# Patient Record
Sex: Female | Born: 1975 | Hispanic: Yes | Marital: Married | Smoking: Never smoker
Health system: Southern US, Community
[De-identification: ages and names within clinical notes are randomized; demographics above are authoritative.]

## PROBLEM LIST (undated history)

## (undated) ENCOUNTER — Inpatient Hospital Stay (HOSPITAL_COMMUNITY): Payer: Self-pay

## (undated) DIAGNOSIS — E669 Obesity, unspecified: Secondary | ICD-10-CM

## (undated) DIAGNOSIS — E119 Type 2 diabetes mellitus without complications: Secondary | ICD-10-CM

## (undated) DIAGNOSIS — Z789 Other specified health status: Secondary | ICD-10-CM

## (undated) HISTORY — PX: NO PAST SURGERIES: SHX2092

---

## 2002-03-29 ENCOUNTER — Inpatient Hospital Stay (HOSPITAL_COMMUNITY): Admission: AD | Admit: 2002-03-29 | Discharge: 2002-03-29 | Payer: Self-pay | Admitting: Obstetrics and Gynecology

## 2003-02-07 ENCOUNTER — Inpatient Hospital Stay (HOSPITAL_COMMUNITY): Admission: AD | Admit: 2003-02-07 | Discharge: 2003-02-07 | Payer: Self-pay | Admitting: Obstetrics and Gynecology

## 2004-01-18 ENCOUNTER — Inpatient Hospital Stay (HOSPITAL_COMMUNITY): Admission: AD | Admit: 2004-01-18 | Discharge: 2004-01-19 | Payer: Self-pay | Admitting: Family Medicine

## 2004-02-12 ENCOUNTER — Encounter: Admission: RE | Admit: 2004-02-12 | Discharge: 2004-02-12 | Payer: Self-pay | Admitting: Obstetrics and Gynecology

## 2004-11-30 ENCOUNTER — Inpatient Hospital Stay (HOSPITAL_COMMUNITY): Admission: AD | Admit: 2004-11-30 | Discharge: 2004-11-30 | Payer: Self-pay | Admitting: *Deleted

## 2006-02-09 ENCOUNTER — Inpatient Hospital Stay (HOSPITAL_COMMUNITY): Admission: AD | Admit: 2006-02-09 | Discharge: 2006-02-09 | Payer: Self-pay | Admitting: Obstetrics and Gynecology

## 2006-04-12 ENCOUNTER — Inpatient Hospital Stay (HOSPITAL_COMMUNITY): Admission: AD | Admit: 2006-04-12 | Discharge: 2006-04-12 | Payer: Self-pay | Admitting: Obstetrics & Gynecology

## 2006-04-14 ENCOUNTER — Ambulatory Visit: Payer: Self-pay | Admitting: Obstetrics & Gynecology

## 2006-04-14 ENCOUNTER — Other Ambulatory Visit: Admission: RE | Admit: 2006-04-14 | Discharge: 2006-04-14 | Payer: Self-pay | Admitting: Obstetrics & Gynecology

## 2006-05-05 ENCOUNTER — Ambulatory Visit: Payer: Self-pay | Admitting: Obstetrics & Gynecology

## 2006-08-26 ENCOUNTER — Ambulatory Visit: Payer: Self-pay | Admitting: Obstetrics & Gynecology

## 2006-08-27 ENCOUNTER — Other Ambulatory Visit: Admission: RE | Admit: 2006-08-27 | Discharge: 2006-08-27 | Payer: Self-pay | Admitting: Obstetrics & Gynecology

## 2006-09-09 ENCOUNTER — Ambulatory Visit: Payer: Self-pay | Admitting: Obstetrics & Gynecology

## 2006-09-16 ENCOUNTER — Ambulatory Visit: Payer: Self-pay | Admitting: Obstetrics and Gynecology

## 2007-12-08 ENCOUNTER — Ambulatory Visit: Payer: Self-pay | Admitting: Obstetrics & Gynecology

## 2007-12-08 ENCOUNTER — Ambulatory Visit (HOSPITAL_COMMUNITY): Admission: RE | Admit: 2007-12-08 | Discharge: 2007-12-08 | Payer: Self-pay | Admitting: Family Medicine

## 2008-02-23 ENCOUNTER — Inpatient Hospital Stay (HOSPITAL_COMMUNITY): Admission: AD | Admit: 2008-02-23 | Discharge: 2008-02-23 | Payer: Self-pay | Admitting: Family Medicine

## 2010-08-17 ENCOUNTER — Encounter: Payer: Self-pay | Admitting: Family Medicine

## 2010-12-12 NOTE — H&P (Signed)
Shelley Bradley, Shelley Bradley        ACCOUNT NO.:  [id number]   MEDICAL RECORD NO.:  [id number]          PATIENT TYPE:  WOC   LOCATION:  WOC                          FACILITY:  [facility]   PHYSICIAN:  Dorma Russell              DATE OF BIRTH:  06/04/1978   DATE OF ADMISSION:  04/14/2006  DATE OF DISCHARGE:                                HISTORY & PHYSICAL   REASON FOR VISIT:  Followup of right cystic ovary with right pelvic pain and  secondary amenorrhea.   HISTORY:  This is a G2, P2-0-0-2 who had two vaginal deliveries, last in  2000.  She first presented to [facility] in July of this year, reporting  approximately 2-year history of abnormal menses with prolonged cycles, often  skipping months.  Prior to that, she had regular monthly cycles.  She states  her flow is moderate, lasting 3 to 4 days, and she has some mild  dysmenorrhea.  No intermenstrual bleeding.  She is not on any contraception  and is trying to get pregnant.   GYN HISTORY:  Pap smear was done in May 2007.  Negative per patient.  Confirmation pending.  She states she has never had an abnormal pap.  She  has never had a mammogram.   SURGICAL HISTORY:  No transfusions.  No operations.   FAMILY HISTORY:  Positive for diabetes and heart disease.   PERSONAL MEDICAL HISTORY:  Negative for significant medical illness.  She  has no allergies.  No medications.   SOCIAL HISTORY:  She does not work outside the home.  Is a nonsmoker.  Denies alcohol and drugs.  Denies any high-risk behaviors for HIV.  Denies  being abused.  Is not strongly motivated to lose weight.   REVIEW OF SYSTEMS:  Negative other than she does have intermittent right  pelvic pain which has been ongoing since July and at times she relates this  to occurrence after intercourse, however, it starts about a day after  intercourse and lasts about a day, and spontaneously resolves.  She does not  have pain at this time.   EXAM:  VITAL SIGNS:  BP 100/60, weight 327,  height 5 feet 2.  Patient is  morbidly obese.  BREASTS:  Deferred this visit.  ABDOMEN:  Soft, morbidly obese, essentially nontender.  PELVIC:  Exam done by Dr. Okey Dupre.  Uterus sounds to 9 cm.  Endometrial biopsy  is done by Dr. Okey Dupre with good tissue sampling.   ULTRASOUND:  The ultrasound findings of the transvaginal ultrasound done on  February 09, 2006 show numerous cystic lesions on the right ovary as well as  mildly thickened endometrium.   ASSESSMENT:  1. Morbid obesity.  2. Polycystic ovarian syndrome.  3. Metabolic syndrome.  4. Secondary amenorrhea.  5. Rule out endometrial hyperplasia.   PLAN:  UPT is deferred today.  TSH also deferred at this time.  Patient is  to return in 2 weeks for the result of her endometrial biopsy.  We will give  consideration for Glucophage and OCPs if this is negative for hyperplasia.  She may need stimulation  in order to achieve pregnancy.      Shelley Bradley, C.N.M.    ______________________________  Dorma Russell    DP/MEDQ  D:  04/14/2006  T:  04/15/2006  Job:  [id number]

## 2010-12-12 NOTE — Group Therapy Note (Signed)
NAME:  Shelley Bradley, Shelley Bradley        ACCOUNT NO.:  [id number]   MEDICAL RECORD NO.:  [id number]          PATIENT TYPE:  MAT   LOCATION:  [facility]                    [facility]   PHYSICIAN:  Elsie Lincoln, MD      DATE OF BIRTH:  06/04/1978   DATE OF SERVICE:                                  CLINIC NOTE   HISTORY:  The patient is a 35 year old female who presents thinking that  she is pregnant and did an  ultrasound department today.  She had a  positive pregnancy test at the health department in April.  She comes  today stating that she feels the baby move.  She is looking at the  ultrasound, but has no baby on the ultrasound and telling her partner  that there is the baby's head, there is the baby's belly, there is the  baby's heart beat.  However, the whole time there is not a baby in the  uterus.  The ultrasound tech called me and I reviewed the findings with  the patient and brought her down to the clinic where I did an urine  pregnancy test and it was negative.  I did call the health department.  I think it is probably a lab error.  The patient did not seem surprised.  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  We had consulted the  gynecologist who suggested Depo-Provera 300 mg every other month or  Mirena IUD.  The patient elected for the Depo-Provera and received 1  shot.  She missed her next appointment and then never followed up.  She  says she remembers all this and knows that she did not followup.  She  has not had a period since December of last year, so to me she is at  high risk for cancer.   Also note, she had approximately a 4 cm hemorrhagic cyst on one of her  ovaries and this needs to be followed up.  We are not able to do an  endometrial biopsy today given that she did not really have a clinic  appointment, but I was seeing her in the ultrasound suite.  So I  scheduled her for an  endometrial biopsy in the next week or 2.  The  patient understands that this could be cancer and could kill her if she  does not come.  I did all of this with an interpreter present even  though the patient speaks moderately good [place].  She stated that she  understood and promised to show up.  We will see the patient for the  endometrial biopsy and give her Depo-Provera or IUD until we get the  biopsy back.           ______________________________  Elsie Lincoln, MD     KL/MEDQ  D:  12/08/2007  T:  12/08/2007  Job:  [id number]

## 2010-12-12 NOTE — Group Therapy Note (Signed)
NAMELARA, PALINKAS NO.:  [id number]   MEDICAL RECORD NO.:  [id number]                   PATIENT TYPE:  OUT   LOCATION:  [facility]                           FACILITY:  [facility]   PHYSICIAN:  Elsie Lincoln, MD                   DATE OF BIRTH:  1975-08-28   DATE OF SERVICE:  02/12/2004                                    CLINIC NOTE   REASON FOR VISIT:  The patient is a 35 year old G2 para 2-0-0-2 most likely  who is a follow-up from [facility] on January 18, 2004 visit.  The patient thought she  was with her third pregnancy.  She said that she got care somewhere in  [place] and was told by a ultrasound that she had twins.  She came into  the [facility]U on January 18, 2004 complaining of abdominal pain.  However, when she  came to the [facility]U her UPT was negative and she was not pregnant.  Today, the  patient cannot remember the name of clinic where she went and we asked her  who took her there and she said her friend but that friend now lives in  [place] and she cannot ask that person where they were.  The patient said  that she now wants workup for infertility.  She has been with her partner  for 3 years.  This is a new partner, who is not the father of her two  children.  She states that she has not had a menses since January 2005.   PAST MEDICAL HISTORY:  Denies; however, is morbidly obese.   GYNECOLOGICAL HISTORY:  No history of ovarian cysts, fibroid tumors STDs.  However, has been amenorrheic since January.   OBSTETRICAL HISTORY:  NSVD x2 done at [facility].   SURGERY:  Denies.   ALLERGIES:  Denies.   ASSESSMENT AND PLAN:  A 35 year old female with questionable oligomenorrhea  and questionable infertility.   1. Given the patient's bizarre story will try to retrieve records.  The     patient is to go to [place] and write down the name and get the phone     number of the place where she got all her prenatal care and recent GYN     care.  She will return this  number to [place] and we will retrieve records.     She will return in 3 weeks and we will go over everything.  The patient     understands that infertility treatment is not covered by the hospital and     she would have to pay up-front before any tests are done.  However, we     will do a workup for oligomenorrhea.  2. Questionable female origin of infertility.  Sperm analysis will be     necessary before any workup is done on the patient.  3. Return to clinic in 3 weeks once records are here.  Elsie Lincoln, MD    KL/MEDQ  D:  02/12/2004  T:  02/12/2004  Job:  [id number]

## 2010-12-12 NOTE — Group Therapy Note (Signed)
Shelley Bradley, ASANO NO.:  [id number]   MEDICAL RECORD NO.:  [id number]          PATIENT TYPE:  WOC   LOCATION[facility]  [facility]                   [facility]ACILITY[facility]   PHYSICIAN:  Argentina Donovan, MD        DATE OF BIRTH:  06/04/1978   DATE OF SERVICE:  09/16/2006                                  CLINIC NOTE   The patient is a 35 year old, Spanish-speaking Hispanic female, gravida  2, para 2-0-0-2, with secondary amenorrhea who is morbidly obese at 322  pounds and at 5 feet tall.  She had an endometrial biopsy in September  of 2007 which showed focal simple hyperplasia without atypia, was placed  on cyclic progesterone, came back and had another biopsy on January 31st  which showed disordered proliferative pattern with focal changes  consistent with complex atypical hyperplasia.  We have consulted with  the GYN oncologists, i.e., Dr. __________ , who suggested that she be  treated with 300 of Depo-Provera every other month, or 150 every month,  or a Merina IUD.  We discussed with Ada, our lovely translator, the  condition, and the patient has opted for the injection.  We are going to  start her on 300 every other month.  At the end of 6 months, we will go  ahead and repeat the endometrial biopsy.   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           ______________________________  Argentina Donovan, MD     PR/MEDQ  D:  09/16/2006  T:  09/16/2006  Job:  [id number]

## 2010-12-12 NOTE — Group Therapy Note (Signed)
Shelley Bradley, Shelley Bradley        ACCOUNT Shelley Bradley.:  [id number]   MEDICAL RECORD Shelley Bradley.:  [id number]          PATIENT TYPE:  WOC   LOCATION:  [facility]                   [facility]ILITY[facility]   PHYSICIAN:  Dorthula Perfect, MD     DATE OF BIRTH:  06/04/1978   DATE OF SERVICE:  08/26/2006                                  CLINIC NOTE   This 35 year old Hispanic female, gravida 2, para 2, was seen here in  September 2007 with secondary amenorrhea.  She weighed 327 pounds.  An  endometrial biopsy was performed which showed focal simple hyperplasia  without atypia, and proliferative endometrium.  She was started on  Provera to take for 10 days beginning the first day of each month.  Her  last menstrual period started January the 13th.  She is currently on her  third and last cycle of the Provera.   PHYSICAL EXAMINATION:  Height 5 feet 1 inch, weight 324, blood pressure  113/78.  ABDOMEN:  She is grossly obese.  PELVIC EXAM:  Was normal.  The anterior lip of the cervix was grasped  with single tooth __________ .  The endocervical canal easily sounded.  The __________ instrument was then inserted for a depth of about 9 cm,  and had a good endometrial biopsy specimen was obtained.   DIAGNOSIS:  History of uterine and simple hyperplasia.   DISPOSITION:  1. __________ endometrial biopsy.  2. Patient will return in 2 weeks for results.  If the findings are      normal at that time, consideration will be to give her medication      to help her to ovulate to become pregnant which she is desirous of.           ______________________________  Dorthula Perfect, MD     ER/MEDQ  D:  08/26/2006  T:  08/26/2006  Job:  [id number]

## 2011-04-24 LAB — URINALYSIS, ROUTINE W REFLEX MICROSCOPIC
Bilirubin Urine: NEGATIVE
Nitrite: NEGATIVE
Specific Gravity, Urine: 1.025
Urobilinogen, UA: 0.2

## 2011-04-24 LAB — URINE MICROSCOPIC-ADD ON

## 2011-06-16 ENCOUNTER — Emergency Department (HOSPITAL_COMMUNITY)
Admission: EM | Admit: 2011-06-16 | Discharge: 2011-06-17 | Disposition: A | Discharge status: Home / Self Care | Payer: Self-pay | Attending: Emergency Medicine | Admitting: Emergency Medicine

## 2011-06-16 ENCOUNTER — Emergency Department (HOSPITAL_COMMUNITY): Payer: Self-pay

## 2011-06-16 DIAGNOSIS — N390 Urinary tract infection, site not specified: Secondary | ICD-10-CM | POA: Insufficient documentation

## 2011-06-16 DIAGNOSIS — O2390 Unspecified genitourinary tract infection in pregnancy, unspecified trimester: Secondary | ICD-10-CM | POA: Insufficient documentation

## 2011-06-16 DIAGNOSIS — R300 Dysuria: Secondary | ICD-10-CM | POA: Insufficient documentation

## 2011-06-16 DIAGNOSIS — Z3490 Encounter for supervision of normal pregnancy, unspecified, unspecified trimester: Secondary | ICD-10-CM

## 2011-06-16 DIAGNOSIS — R109 Unspecified abdominal pain: Secondary | ICD-10-CM | POA: Insufficient documentation

## 2011-06-16 LAB — HCG, QUANTITATIVE, PREGNANCY: hCG, Beta Chain, Quant, S: 12351 m[IU]/mL — ABNORMAL HIGH (ref <5)

## 2011-06-16 LAB — CBC
MCH: 27.7 pg (ref 26.0–34.0)
MCHC: 33 g/dL (ref 30.0–36.0)
RBC: 4.19 MIL/uL (ref 3.87–5.11)
RDW: 14.1 % (ref 11.5–15.5)

## 2011-06-16 LAB — BASIC METABOLIC PANEL
Calcium: 9.5 mg/dL (ref 8.4–10.5)
Chloride: 98 mEq/L (ref 96–112)
GFR calc Af Amer: >90 mL/min (ref >90)
GFR calc non Af Amer: >90 mL/min (ref >90)
Glucose, Bld: 86 mg/dL (ref 70–99)
Potassium: 4.2 mEq/L (ref 3.5–5.1)

## 2011-06-16 LAB — DIFFERENTIAL
Basophils Relative: 0 % (ref 0–1)
Eosinophils Absolute: 0.1 10*3/uL (ref 0.0–0.7)
Eosinophils Relative: 1 % (ref 0–5)
Lymphocytes Relative: 32 % (ref 12–46)
Lymphs Abs: 2.6 10*3/uL (ref 0.7–4.0)
Neutro Abs: 4.9 10*3/uL (ref 1.7–7.7)
Neutrophils Relative %: 62 % (ref 43–77)

## 2011-06-16 LAB — URINALYSIS, ROUTINE W REFLEX MICROSCOPIC
Bilirubin Urine: NEGATIVE
Protein, ur: 100 mg/dL — AB
Specific Gravity, Urine: 1.023 (ref 1.005–1.030)
Urobilinogen, UA: 1 mg/dL (ref 0.0–1.0)

## 2011-06-16 LAB — URINE MICROSCOPIC-ADD ON

## 2011-06-16 LAB — POCT PREGNANCY, URINE: Preg Test, Ur: POSITIVE

## 2011-06-16 MED ORDER — PRENATAL RX 60-1 MG PO TABS: 1.0000 | ORAL_TABLET | Freq: Every day | ORAL | Status: DC

## 2011-06-16 MED ORDER — CEFTRIAXONE SODIUM 1 G IJ SOLR
1.0000 g | Freq: Once | INTRAMUSCULAR | Status: AC
  Administered 2011-06-16: 1 g via INTRAVENOUS
  Filled 2011-06-16: qty 10

## 2011-06-16 MED ORDER — SODIUM CHLORIDE 0.9 % IV BOLUS (SEPSIS)
1000.0000 mL | Freq: Once | INTRAVENOUS | Status: AC
  Administered 2011-06-16: 1000 mL via INTRAVENOUS

## 2011-06-16 MED ORDER — CEPHALEXIN 500 MG PO CAPS: 500.0000 mg | ORAL_CAPSULE | Freq: Two times a day (BID) | ORAL | Status: AC

## 2011-06-16 NOTE — ED Notes (Signed)
Patient returned from Ultrasound. 

## 2011-06-16 NOTE — ED Notes (Signed)
Patient transported to Ultrasound 

## 2011-06-16 NOTE — ED Notes (Signed)
Complaining of abd pain and urine in her blood. Does not speak english

## 2011-06-16 NOTE — ED Provider Notes (Signed)
History     CSN: [id number] Arrival date & time: 06/16/2011  7:26 PM   None     Chief Complaint  Patient presents with  . Flank Pain    (Consider location/radiation/quality/duration/timing/severity/associated sxs/prior treatment) Patient is a 35 y.o. female presenting with flank pain. The history is provided by the patient and a relative.  Flank Pain This is a new problem. The current episode started 2 days ago. The problem occurs constantly. The problem has not changed since onset.The symptoms are aggravated by nothing. The symptoms are relieved by nothing. She has tried nothing for the symptoms.  pt with lmp 2 years ago, g4p2, now with flank pain radiating to rlq with dysuria, no fever, emesis x1  History reviewed. No pertinent past medical history.  History reviewed. No pertinent past surgical history.  History reviewed. No pertinent family history.  History  Substance Use Topics  . Smoking status: Not on file  . Smokeless tobacco: Not on file  . Alcohol Use: No    OB History    Grav Para Term Preterm Abortions TAB SAB Ect Mult Living                  Review of Systems  Genitourinary: Positive for flank pain.  All other systems reviewed and are negative.    Allergies  Review of patient's allergies indicates no known allergies.  Home Medications  No current outpatient prescriptions on file.  BP 131/67  Pulse 98  Temp(Src) 98.1 F (36.7 C) (Oral)  Resp 20  SpO2 100%  Physical Exam  Nursing note and vitals reviewed. Constitutional: She is oriented to person, place, and time. Vital signs are normal. She appears well-developed and well-nourished.  Non-toxic appearance. No distress.  HENT:  Head: Normocephalic and atraumatic.  Eyes: Conjunctivae and EOM are normal. Pupils are equal, round, and reactive to light.  Neck: Normal range of motion. Neck supple. No tracheal deviation present.  Cardiovascular: Normal rate, regular rhythm and normal heart sounds.   Exam reveals no gallop.   No murmur heard. Pulmonary/Chest: Effort normal and breath sounds normal. No stridor. No respiratory distress. She has no wheezes.  Abdominal: Soft. Normal appearance and bowel sounds are normal. She exhibits no distension. There is no tenderness. There is CVA tenderness. There is no rebound and no guarding.  Musculoskeletal: Normal range of motion. She exhibits no edema and no tenderness.  Neurological: She is alert and oriented to person, place, and time. She has normal strength. No cranial nerve deficit or sensory deficit. GCS eye subscore is 4. GCS verbal subscore is 5. GCS motor subscore is 6.  Skin: Skin is warm and dry.  Psychiatric: She has a normal mood and affect. Her speech is normal and behavior is normal.    ED Course  Procedures (including critical care time)  Labs Reviewed  URINALYSIS, ROUTINE W REFLEX MICROSCOPIC - Abnormal; Notable for the following:    Appearance CLOUDY (*)    Hgb urine dipstick LARGE (*)    Protein, ur 100 (*)    Leukocytes, UA LARGE (*)    All other components within normal limits  URINE MICROSCOPIC-ADD ON - Abnormal; Notable for the following:    Squamous Epithelial / LPF MANY (*)    Bacteria, UA MANY (*)    All other components within normal limits  POCT PREGNANCY, URINE  POCT PREGNANCY, URINE  CBC  DIFFERENTIAL  BASIC METABOLIC PANEL  HCG, QUANTITATIVE, PREGNANCY   No results found.   No diagnosis  found.    MDM  Pt will be tx for pyelonephritis, awaiting pelvic US, moved to cdu for holding, d/w np Felicie Morn, will f/u studies        Toy Baker, MD 06/20/11 1434

## 2011-06-16 NOTE — ED Notes (Signed)
Pt presents with 2 day h/o R flank pain that has been constant and radiates into R groin.  Pt reports dysuria and onset of hematuria since yesterday.  +vomiting.

## 2011-07-10 ENCOUNTER — Other Ambulatory Visit: Payer: Self-pay

## 2011-07-28 NOTE — L&D Delivery Note (Signed)
Delivery Note At 8:19 AM a viable female was delivered via Vaginal, Spontaneous Delivery (Presentation: ;  ).  APGAR: 8, 9; weight 6 lb 2.1 oz (2781 g).   Placenta status: , .  Cord:  with the following complications: .  Cord pH: not done  Anesthesia: Epidural  Episiotomy:  Lacerations:  Suture Repair: 2.0 Est. Blood Loss (mL):   Mom to postpartum.  Baby to nursery-stable.  Sussan Meter A 10/28/2011, 8:30 AM

## 2011-08-06 LAB — ABO/RH: RH Type: POSITIVE

## 2011-08-06 LAB — GC/CHLAMYDIA PROBE AMP, GENITAL
Chlamydia: NEGATIVE
Gonorrhea: NEGATIVE

## 2011-08-06 LAB — RUBELLA ANTIBODY, IGM: Rubella: IMMUNE

## 2011-08-07 ENCOUNTER — Other Ambulatory Visit (HOSPITAL_COMMUNITY): Payer: Self-pay | Admitting: Obstetrics

## 2011-08-07 DIAGNOSIS — Z3689 Encounter for other specified antenatal screening: Secondary | ICD-10-CM

## 2011-08-13 ENCOUNTER — Ambulatory Visit (HOSPITAL_COMMUNITY): Payer: Self-pay | Attending: Obstetrics

## 2011-08-24 ENCOUNTER — Encounter (HOSPITAL_COMMUNITY): Payer: Self-pay

## 2011-08-24 ENCOUNTER — Ambulatory Visit (HOSPITAL_COMMUNITY)
Admission: RE | Admit: 2011-08-24 | Discharge: 2011-08-24 | Disposition: A | Discharge status: Home / Self Care | Payer: Self-pay | Source: Ambulatory Visit | Attending: Obstetrics | Admitting: Obstetrics

## 2011-08-24 DIAGNOSIS — O358XX0 Maternal care for other (suspected) fetal abnormality and damage, not applicable or unspecified: Secondary | ICD-10-CM | POA: Insufficient documentation

## 2011-08-24 DIAGNOSIS — Z363 Encounter for antenatal screening for malformations: Secondary | ICD-10-CM | POA: Insufficient documentation

## 2011-08-24 DIAGNOSIS — E669 Obesity, unspecified: Secondary | ICD-10-CM | POA: Insufficient documentation

## 2011-08-24 DIAGNOSIS — Z1389 Encounter for screening for other disorder: Secondary | ICD-10-CM | POA: Insufficient documentation

## 2011-08-24 DIAGNOSIS — Z3689 Encounter for other specified antenatal screening: Secondary | ICD-10-CM

## 2011-09-05 ENCOUNTER — Inpatient Hospital Stay (HOSPITAL_COMMUNITY)
Admission: AD | Admit: 2011-09-05 | Discharge: 2011-09-05 | Disposition: A | Discharge status: Home / Self Care | Payer: Self-pay | Source: Ambulatory Visit | Attending: Obstetrics | Admitting: Obstetrics

## 2011-09-05 ENCOUNTER — Encounter (HOSPITAL_COMMUNITY): Payer: Self-pay | Admitting: *Deleted

## 2011-09-05 DIAGNOSIS — A499 Bacterial infection, unspecified: Secondary | ICD-10-CM | POA: Insufficient documentation

## 2011-09-05 DIAGNOSIS — N760 Acute vaginitis: Secondary | ICD-10-CM | POA: Insufficient documentation

## 2011-09-05 DIAGNOSIS — O2390 Unspecified genitourinary tract infection in pregnancy, unspecified trimester: Secondary | ICD-10-CM | POA: Insufficient documentation

## 2011-09-05 DIAGNOSIS — B9689 Other specified bacterial agents as the cause of diseases classified elsewhere: Secondary | ICD-10-CM | POA: Insufficient documentation

## 2011-09-05 DIAGNOSIS — R109 Unspecified abdominal pain: Secondary | ICD-10-CM | POA: Insufficient documentation

## 2011-09-05 HISTORY — DX: Other specified health status: Z78.9

## 2011-09-05 LAB — WET PREP, GENITAL

## 2011-09-05 LAB — URINE MICROSCOPIC-ADD ON

## 2011-09-05 LAB — URINALYSIS, ROUTINE W REFLEX MICROSCOPIC
Bilirubin Urine: NEGATIVE
Ketones, ur: NEGATIVE mg/dL
Nitrite: NEGATIVE
Urobilinogen, UA: 0.2 mg/dL (ref 0.0–1.0)

## 2011-09-05 LAB — AMNISURE RUPTURE OF MEMBRANE (ROM) NOT AT ARMC: Amnisure ROM: NEGATIVE

## 2011-09-05 MED ORDER — PRENATAL RX 60-1 MG PO TABS: 1.0000 | ORAL_TABLET | Freq: Every day | ORAL | Status: DC

## 2011-09-05 MED ORDER — METRONIDAZOLE 500 MG PO TABS: 500.0000 mg | ORAL_TABLET | Freq: Two times a day (BID) | ORAL | Status: AC

## 2011-09-05 NOTE — Progress Notes (Signed)
Written and verbal d/c instructions given and understanding voiced. 

## 2011-09-05 NOTE — ED Notes (Signed)
Georges Mouse CNM and interpreter in to discuss d/c plan

## 2011-09-05 NOTE — ED Notes (Signed)
Unable to void

## 2011-09-05 NOTE — Progress Notes (Signed)
In-house spanish interpreter helping with communication. 

## 2011-09-05 NOTE — Progress Notes (Signed)
About 2030 started leaking fld and thinks was clear fld. Afterward starting hurting like having ctxs

## 2011-09-05 NOTE — Progress Notes (Signed)
I&O cath done and pt tol well. Urine clear. Pt leaked some urine during cath. After cath pt got up to BR to void

## 2011-09-05 NOTE — Progress Notes (Signed)
Georges Mouse CNM in [place] exam done and fern slide and amnisure obtained. Pt tol well.

## 2011-09-05 NOTE — ED Notes (Signed)
Wet prep obtained and to lab 

## 2011-09-05 NOTE — ED Provider Notes (Signed)
History     No chief complaint on file.  HPI 36 y.o. [id number] at [redacted]w[redacted]d c/o SROM at 2030 tonight, abd pain started shortly thereafter. Reports 5 contractions in 1 hour. No bleeding.     Past Medical History  Diagnosis Date  . No pertinent past medical history     Past Surgical History  Procedure Date  . No past surgeries     Family History  Problem Relation Age of Onset  . Anesthesia problems Neg Hx   . Hypotension Neg Hx   . Malignant hyperthermia Neg Hx   . Pseudochol deficiency Neg Hx     History  Substance Use Topics  . Smoking status: Never Smoker   . Smokeless tobacco: Not on file  . Alcohol Use: No    Allergies: No Known Allergies  Prescriptions prior to admission  Medication Sig Dispense Refill  . ibuprofen (ADVIL,MOTRIN) 200 MG tablet Take 200 mg by mouth every 6 (six) hours as needed. pain      . Prenatal Vit-Fe Fumarate-FA (PRENATAL MULTIVITAMIN) 60-1 MG tablet Take 1 tablet by mouth daily.  30 tablet  0    Review of Systems  Constitutional: Negative.   Respiratory: Negative.   Cardiovascular: Negative.   Gastrointestinal: Positive for abdominal pain. Negative for nausea, vomiting, diarrhea and constipation.  Genitourinary: Negative for dysuria, urgency, frequency, hematuria and flank pain.       Negative for vaginal bleeding  Musculoskeletal: Negative.   Neurological: Negative.   Psychiatric/Behavioral: Negative.    Physical Exam   There were no vitals taken for this visit.  Physical Exam  Nursing note and vitals reviewed. Constitutional: She is oriented to person, place, and time. She appears well-developed and well-nourished. She appears distressed.  Cardiovascular: Normal rate.   Respiratory: Effort normal. No respiratory distress.  GI: Soft. There is no tenderness.  Genitourinary: Cervix exhibits discharge (small amount of clear mucous). No bleeding around the vagina.       No pooling, SVE: FT/50%/vertex floating  Musculoskeletal: Normal  range of motion.  Neurological: She is alert and oriented to person, place, and time.  Skin: Skin is warm and dry.  Psychiatric: She has a normal mood and affect.   Fern negative No contractions on monitor MAU Course  Procedures  Results for orders placed during the hospital encounter of 09/05/11 (from the past 24 hour(s))  AMNISURE RUPTURE OF MEMBRANE (ROM)     Status: Normal   Collection Time   09/05/11  9:30 PM      Component Value Range   Amnisure ROM NEGATIVE    POCT FERN TEST     Status: Normal   Collection Time   09/05/11  9:32 PM      Component Value Range   Fern Test Negative    WET PREP, GENITAL     Status: Abnormal   Collection Time   09/05/11  9:35 PM      Component Value Range   Yeast Wet Prep HPF POC NONE SEEN  NONE SEEN    Trich, Wet Prep NONE SEEN  NONE SEEN    Clue Cells Wet Prep HPF POC FEW (*) NONE SEEN    WBC, Wet Prep HPF POC FEW (*) NONE SEEN   URINALYSIS, ROUTINE W REFLEX MICROSCOPIC     Status: Abnormal   Collection Time   09/05/11 10:05 PM      Component Value Range   Color, Urine YELLOW  YELLOW    APPearance CLEAR  CLEAR  Specific Gravity, Urine <1.005 (*) 1.005 - 1.030    pH 6.5  5.0 - 8.0    Glucose, UA NEGATIVE  NEGATIVE (mg/dL)   Hgb urine dipstick TRACE (*) NEGATIVE    Bilirubin Urine NEGATIVE  NEGATIVE    Ketones, ur NEGATIVE  NEGATIVE (mg/dL)   Protein, ur NEGATIVE  NEGATIVE (mg/dL)   Urobilinogen, UA 0.2  0.0 - 1.0 (mg/dL)   Nitrite NEGATIVE  NEGATIVE    Leukocytes, UA NEGATIVE  NEGATIVE   URINE MICROSCOPIC-ADD ON     Status: Normal   Collection Time   09/05/11 10:05 PM      Component Value Range   Squamous Epithelial / LPF RARE  RARE    WBC, UA 0-2  <3 (WBC/hpf)     Assessment and Plan  36 y.o. Z6X0960 at [redacted]w[redacted]d No evidence of SROM or labor, likely leaking urine Urine culture sent BV - rx flagyl F/U as scheduled, precautions rev'd  Jimma Ortman 09/05/2011, 9:50 PM

## 2011-09-05 NOTE — ED Notes (Signed)
Pt unable to void. Interpreter explained if unable to void, will need to get urine by I&O cath. Pt would like to be cathed rather than try to void.

## 2011-09-07 LAB — URINE CULTURE: Culture  Setup Time: 201302101654

## 2011-09-24 ENCOUNTER — Encounter (HOSPITAL_COMMUNITY): Payer: Self-pay | Admitting: *Deleted

## 2011-09-24 ENCOUNTER — Inpatient Hospital Stay (HOSPITAL_COMMUNITY): Payer: Self-pay

## 2011-09-24 ENCOUNTER — Inpatient Hospital Stay (HOSPITAL_COMMUNITY)
Admission: AD | Admit: 2011-09-24 | Discharge: 2011-09-24 | Disposition: A | Discharge status: Home / Self Care | Payer: Self-pay | Source: Ambulatory Visit | Attending: Obstetrics | Admitting: Obstetrics

## 2011-09-24 DIAGNOSIS — O26899 Other specified pregnancy related conditions, unspecified trimester: Secondary | ICD-10-CM

## 2011-09-24 DIAGNOSIS — S334XXA Traumatic rupture of symphysis pubis, initial encounter: Secondary | ICD-10-CM

## 2011-09-24 DIAGNOSIS — O99210 Obesity complicating pregnancy, unspecified trimester: Secondary | ICD-10-CM | POA: Insufficient documentation

## 2011-09-24 DIAGNOSIS — O9989 Other specified diseases and conditions complicating pregnancy, childbirth and the puerperium: Secondary | ICD-10-CM

## 2011-09-24 DIAGNOSIS — R1031 Right lower quadrant pain: Secondary | ICD-10-CM | POA: Insufficient documentation

## 2011-09-24 DIAGNOSIS — O3490 Maternal care for abnormality of pelvic organ, unspecified, unspecified trimester: Secondary | ICD-10-CM | POA: Insufficient documentation

## 2011-09-24 DIAGNOSIS — E669 Obesity, unspecified: Secondary | ICD-10-CM | POA: Insufficient documentation

## 2011-09-24 DIAGNOSIS — N949 Unspecified condition associated with female genital organs and menstrual cycle: Secondary | ICD-10-CM

## 2011-09-24 HISTORY — DX: Obesity, unspecified: E66.9

## 2011-09-24 LAB — CBC
HCT: 32.3 % — ABNORMAL LOW (ref 36.0–46.0)
Hemoglobin: 10.6 g/dL — ABNORMAL LOW (ref 12.0–15.0)
MCHC: 32.8 g/dL (ref 30.0–36.0)
MCV: 83.2 fL (ref 78.0–100.0)
RDW: 14.3 % (ref 11.5–15.5)
WBC: 5.7 10*3/uL (ref 4.0–10.5)

## 2011-09-24 LAB — WET PREP, GENITAL: Clue Cells Wet Prep HPF POC: NONE SEEN

## 2011-09-24 LAB — URINALYSIS, ROUTINE W REFLEX MICROSCOPIC
Bilirubin Urine: NEGATIVE
Hgb urine dipstick: NEGATIVE
Nitrite: NEGATIVE
Protein, ur: NEGATIVE mg/dL
Specific Gravity, Urine: <1.005 — ABNORMAL LOW (ref 1.005–1.030)
Urobilinogen, UA: 0.2 mg/dL (ref 0.0–1.0)

## 2011-09-24 LAB — FETAL FIBRONECTIN: Fetal Fibronectin: POSITIVE — AB

## 2011-09-24 MED ORDER — ONDANSETRON HCL 4 MG/2ML IJ SOLN
4.0000 mg | Freq: Once | INTRAMUSCULAR | Status: AC
  Administered 2011-09-24: 4 mg via INTRAVENOUS
  Filled 2011-09-24: qty 2

## 2011-09-24 MED ORDER — ACETAMINOPHEN-CODEINE #3 300-30 MG PO TABS: 2.0000 | ORAL_TABLET | Freq: Once | ORAL | Status: DC

## 2011-09-24 MED ORDER — BUTORPHANOL TARTRATE 2 MG/ML IJ SOLN
1.0000 mg | Freq: Once | INTRAMUSCULAR | Status: AC
  Administered 2011-09-24: 1 mg via INTRAVENOUS
  Filled 2011-09-24: qty 1

## 2011-09-24 MED ORDER — OXYCODONE-ACETAMINOPHEN 5-500 MG PO CAPS: 1.0000 | ORAL_CAPSULE | ORAL | Status: AC | PRN

## 2011-09-24 NOTE — Discharge Instructions (Signed)
Dislocacin o subluxacin (Dislocation or Subluxation) La dislocacin de una articulacin se produce cuando los extremos de dos o ms huesos adyacentes dejan de Chartered loss adjuster. Una subluxacin es una forma menor de dislocacin, en la que dos o ms huesos adyacentes no estn correctamente alineados. Las articulaciones ms susceptibles de dislocacin son los hombros, las rtulas y los dedos.  SNTOMAS  Dolor instantneo en el momento de la lesin.   Deformidad notoria en la zona de la articulacin.   Amplitud de movimientos limitada.  CAUSAS  Generalmente es un traumatismo que distiende o rompe los ligamentos que rodean la articulacin y que [employer] huesos juntos.   En algunos casos es un defecto que est presente al nacer (congnito) debido a que las superficies de las articulaciones no son profundas o Arts administrator.   Enfermedad de las articulaciones, como en el caso de la artritis u otras enfermedades de los ligamentos y los tejidos alrededor de Nurse, learning disability.  LOS RIESGOS AUMENTAN CON:  Traumatismos repetidos en la articulacin.   Dislocacin previa de una articulacin.   Deportes de Pharmacologist (ftbol, rugby, hockey o lacrosse) o deportes que requieran movimientos repetitivos del brazo por encima de la cabeza (lanzamiento, natacin, vley)   Artritis reumatoidea   Afecciones congnitas de las articulaciones.  PREVENCIN  Runner, broadcasting/film/video y [address] adecuadamente antes de [place].   Mantenga un estado fsico adecuado.   La flexibilidad de las articulaciones   La fuerza y la resistencia muscular.   El buen [place] cardiovascular.   Utilizar un equipo protector y asegurarse de Secretary/administrator.   Aprenda y [employer].  PRONSTICO Este trastorno generalmente es curable con el tratamiento adecuado. Luego que las articulaciones que sufrieron la dislocacin se Marine scientist (reducido), podr necesitar cierto tiempo  de inmovilizacin con un yeso, tabilla o cabestrillo durante 2 a 6 semanas, a las que seguir un perodo de ejercicios de elongacin y fortalecimiento que podr Education officer, environmental en su casa o con un terapeuta. COMPLICACIONES RELACIONADAS  Lesin en los nervios o en los principales vasos sanguneos de la zona, lo que causa adormecimiento, enfriamiento o palidez.   Lesiones recurrentes en la articulacin.   Artritis en la articulacin afectada.   Fractura de la articulacin  TRATAMIENTO El tratamiento inicialmente implica la alineacin de los huesos (reduccin) de Nurse, learning disability. La reduccin slo puede realizarla una persona entrenada en el procedimiento. Luego de la reduccin de Nurse, learning disability, le indicarn medicamentos y la aplicacin de hielo para Engineer, materials y la inflamacin. Se inmovilizar la articulacin para permitir que los msculos y ligamentos se curen. Si una articulacin sufre dislocaciones recurrentes, ser necesario que se someta a una ciruga para tensar o Microbiologist las Scientist, research (medical). Luego de la ciruga, deber Education officer, environmental ejercicios de elongacin y fortalecimiento. Los ejercicios pueden Management consultant o con un terapeuta. MEDICAMENTOS  Algunos pacientes necesitarn sedantes o relajantes musculares para realizar la reduccin de la articulacin   Si necesita analgsicos, se recomiendan los antiinflamatorios no esteroides, como aspirina e ibuprofeno y otros calmantes menores, como acetaminofeno   No los tome dentro de los [address]  [address] previos a la [place].   Podrn ser necesarios los analgsicos prescriptos. Utilcelos como se le indique y slo cuando lo necesite.  SOLICITE ATENCIN MDICA SI:   Los sntomas empeoran o no mejoran a Designer, industrial/product.   Tiene dificultad para mover una articulacin luego del traumatismo.   Alguna de las extremidades se adormece se torna  plida o fra luego de un traumatismo. Esto es Radio broadcast assistant.   Las dislocaciones o  subluxaciones pueden ocurrir repetidamente.  Document Released: 04/29/2006 Document Revised: 03/25/2011 [facility] Patient Information 2012 [place], [place].Dolor del ligamento redondo (Round Ligament Pain) El ligamento redondo se compone de msculo y tejido fibroso. Est unido al tero cerca de las trompas de [place] El ligamento redondo est ubicado en ambos lados del tero y [place] a Pharmacologist su posicin. Normalmente comienza en el segundo trimestre del embarazo cuando el tero sale hacia afuera de la pelvis. El dolor puede aparecer y desaparecer hasta el nacimiento del beb. El dolor de ligamento redondo no es un problema serio y no ocasiona daos al beb. CAUSAS Durante el Consulting civil engineer tero crece mayormente desde el segundo trimestre [place]. A medida que crece, se estira y tuerce ligeramente los ligamentos. Cuando el tero ejerce presin en ambos lados, el ligamento redondo del lado opuesto presiona y se Psychologist, occupational. Esto causa dolor. SNTOMAS El dolor puede ocurrir en uno o ambos lados. El dolor es por lo general como un pellizco corto y filoso. A veces puede ser un dolor opaco y persistente. Se siente en la parte baja del abdomen o en la ingle. Es un Adult nurse, y por lo general comienza en la ingle y se mueve hacia la zona de la cadera. El dolor puede ocurrir con:  Chief of Staff de posicin repentino como el levantarse de la cama o de una silla.   Darse vuelta en la cama.   Toser o estornudar.   Caminar demasiado.   Cualquier tipo de actividad fsica.  DIAGNSTICO El medico deber asegurarse de que no existen problemas graves que Audiological scientist. Si no encuentra nada grave, los sntomas suelen indicar de que se trata de un dolor proveniente del ligamento redondo. TRATAMIENTO  Sintese y reljese cuando el dolor comience.   Llevar las rodillas [employer].   Recustese sobre un lado con una almohada debajo del vientre (abdomen) y Oletta Lamas sus piernas.   Sintese en un bao  caliente de 15 a 20 minutos o hasta que el dolor desaparezca.  INSTRUCCIONES PARA EL CUIDADO DOMICILIARIO  Utilice los medicamentos de venta libre o de prescripcin para Chief Technology Officer, el malestar o la [place], segn se lo indique el profesional que lo asiste.   Sintese y pngase de pie lentamente.   Evite caminatas largas si le ocasionan dolor.   Detenga o disminuya las actividades fsicas si Sports administrator.  SOLICITE ATENCIN MDICA SI:  El dolor no desaparece con estas medidas.   Necesita que le prescriban medicamentos ms fuertes para Chief Technology Officer.   Desarrolla un dolor de espalda que no haba sentido antes junto con el de lado.  SOLICITE ATENCIN MDICA DE INMEDIATO SI:  La temperatura se eleva por encima de 102 F (38.9 C) o ms.   Siente contracciones uterinas.   Presenta hemorragia vaginal.   Presenta nuseas, diarrea o vmitos.   Comienza a sentir escalofros   Electronics engineer al [employer].  Document Released: 06/25/2008 Document Revised: 03/25/2011 [facility] Patient Information 2012 [place], [place].

## 2011-09-24 NOTE — ED Provider Notes (Signed)
Shelley Bradley is a 36 y.o. year old [id number] female at [redacted]w[redacted]d weeks gestation who presents to MAU reporting severe low abd pain that started four days, but has worsened significantly. Pain is exacerbated by movement,  She reports pelvic pressure and feeling as is she has to push when she urinates.   Maternal Medical History:  Reason for admission: Reason for Admission:   nausea  OB History    Grav Para Term Preterm Abortions TAB SAB Ect Mult Living   5 2 1 1 2  0 2 0 0 2     Past Medical History  Diagnosis Date  . No pertinent past medical history   . Obese    Past Surgical History  Procedure Date  . No past surgeries    Family History: family history is negative for Anesthesia problems, and Hypotension, and Malignant hyperthermia, and Pseudochol deficiency, . Social History:  reports that she has never smoked. She does not have any smokeless tobacco history on file. She reports that she does not drink alcohol or use illicit drugs.  Review of Systems  Constitutional: Negative for fever and chills.       Negative for loss of appetite.  Gastrointestinal: Positive for abdominal pain and diarrhea (few episodes). Negative for nausea, vomiting and constipation.  Genitourinary: Negative for dysuria, urgency, frequency, hematuria and flank pain.       Denies contractions, vaginal bleeding, leaking of fluid or vaginal discharge.     Blood pressure 128/58, pulse 116, temperature 98.1 F (36.7 C), temperature source Oral, resp. rate 20, height 5\' 1"  (1.549 m), weight 152.681 kg (336 lb 9.6 oz), SpO2 99.00%. Maternal Exam:  Uterine Assessment: None  Abdomen: Patient reports the following abdominal tenderness: LLQ and RLQ.  Fundal height is S>D.    Introitus: Normal vulva. Normal vagina.  Vagina is negative for discharge.  Pelvis: adequate for delivery.   Cervix: Cervix evaluated by sterile speculum exam and digital exam.     Fetal Exam Fetal Monitor Review: Mode: ultrasound.     Baseline rate: 150.  Variability: moderate (6-25 bpm).   Pattern: accelerations present and no decelerations.    Fetal State Assessment: Category I - tracings are normal.     Physical Exam  Constitutional: She is oriented to person, place, and time. She appears well-developed and well-nourished. She appears distressed (mildly distressed at rest. severely distressed w/ mvmt).  Cardiovascular: Tachycardia present.   Respiratory: Effort normal.  GI: Soft. Bowel sounds are normal. She exhibits no mass. There is tenderness in the right lower quadrant and left lower quadrant. There is guarding.       Throughout low abdomen. UTA for rebound tenderness due to pt severe discomfort with all palpation and language barrier.  Genitourinary: There is tenderness on the right labia. There is no lesion on the right labia. There is tenderness on the left labia. There is no lesion on the left labia. Uterus is tender (lower uterus). Cervix exhibits motion tenderness and discharge (mucus). Cervix exhibits no friability. Right adnexum displays tenderness. Left adnexum displays tenderness. There is tenderness around the vagina. No bleeding around the vagina. No vaginal discharge found.  Neurological: She is alert and oriented to person, place, and time.  Skin: Skin is warm and dry.  Psychiatric: She has a normal mood and affect.    Prenatal labs: ABO, Rh:   Antibody:   Rubella:   RPR:    HBsAg:    HIV:    GBS:     Results  for orders placed during the hospital encounter of 09/24/11 (from the past 24 hour(s))  CBC     Status: Abnormal   Collection Time   09/24/11  6:35 PM      Component Value Range   WBC 5.7  4.0 - 10.5 (K/uL)   RBC 3.88  3.87 - 5.11 (MIL/uL)   Hemoglobin 10.6 (*) 12.0 - 15.0 (g/dL)   HCT 40.9 (*) 81.1 - 46.0 (%)   MCV 83.2  78.0 - 100.0 (fL)   MCH 27.3  26.0 - 34.0 (pg)   MCHC 32.8  30.0 - 36.0 (g/dL)   RDW 91.4  78.2 - 95.6 (%)   Platelets 280  150 - 400 (K/uL)  WET PREP,  GENITAL     Status: Abnormal   Collection Time   09/24/11  6:35 PM      Component Value Range   Yeast Wet Prep HPF POC NONE SEEN  NONE SEEN    Trich, Wet Prep NONE SEEN  NONE SEEN    Clue Cells Wet Prep HPF POC NONE SEEN  NONE SEEN    WBC, Wet Prep HPF POC FEW (*) NONE SEEN   FETAL FIBRONECTIN     Status: Abnormal   Collection Time   09/24/11  6:35 PM      Component Value Range   Fetal Fibronectin POSITIVE (*) NEGATIVE    Consulted w/ Dr. Gaynell Face at 1900: Stadol F/U US.    Dorathy Kinsman 09/24/2011, 7:17 PM  Care turned over to Caren Griffins, CNM at 2000.  Results for orders placed during the hospital encounter of 09/24/11 (from the past 24 hour(s))  CBC     Status: Abnormal   Collection Time   09/24/11  6:35 PM      Component Value Range   WBC 5.7  4.0 - 10.5 (K/uL)   RBC 3.88  3.87 - 5.11 (MIL/uL)   Hemoglobin 10.6 (*) 12.0 - 15.0 (g/dL)   HCT 21.3 (*) 08.6 - 46.0 (%)   MCV 83.2  78.0 - 100.0 (fL)   MCH 27.3  26.0 - 34.0 (pg)   MCHC 32.8  30.0 - 36.0 (g/dL)   RDW 57.8  46.9 - 62.9 (%)   Platelets 280  150 - 400 (K/uL)  WET PREP, GENITAL     Status: Abnormal   Collection Time   09/24/11  6:35 PM      Component Value Range   Yeast Wet Prep HPF POC NONE SEEN  NONE SEEN    Trich, Wet Prep NONE SEEN  NONE SEEN    Clue Cells Wet Prep HPF POC NONE SEEN  NONE SEEN    WBC, Wet Prep HPF POC FEW (*) NONE SEEN   FETAL FIBRONECTIN     Status: Abnormal   Collection Time   09/24/11  6:35 PM      Component Value Range   Fetal Fibronectin POSITIVE (*) NEGATIVE   URINALYSIS, ROUTINE W REFLEX MICROSCOPIC     Status: Abnormal   Collection Time   09/24/11  7:15 PM      Component Value Range   Color, Urine YELLOW  YELLOW    APPearance CLEAR  CLEAR    Specific Gravity, Urine <1.005 (*) 1.005 - 1.030    pH 5.5  5.0 - 8.0    Glucose, UA NEGATIVE  NEGATIVE (mg/dL)   Hgb urine dipstick NEGATIVE  NEGATIVE    Bilirubin Urine NEGATIVE  NEGATIVE    Ketones, ur NEGATIVE  NEGATIVE (mg/dL)  Protein, ur NEGATIVE  NEGATIVE (mg/dL)   Urobilinogen, UA 0.2  0.0 - 1.0 (mg/dL)   Nitrite NEGATIVE  NEGATIVE    Leukocytes, UA NEGATIVE  NEGATIVE    [place]: all normal @53rd  %ile, ant plac, cx 4.2 cm  Abd: tender to palpation over pubic symphysis and right > left groin; otherwise abd essentially NT  A: Multip at [redacted]w[redacted]d, morbid obesity    Symphyseal separation and RLP  P: C/W Dr. Gaynell Face: Rx Tylox, pregnancy girdle Call office if not improving

## 2011-09-25 LAB — GC/CHLAMYDIA PROBE AMP, GENITAL
Chlamydia, DNA Probe: NEGATIVE
GC Probe Amp, Genital: NEGATIVE

## 2011-09-28 ENCOUNTER — Encounter (HOSPITAL_COMMUNITY): Payer: Self-pay | Admitting: *Deleted

## 2011-09-28 ENCOUNTER — Inpatient Hospital Stay (HOSPITAL_COMMUNITY)
Admission: AD | Admit: 2011-09-28 | Discharge: 2011-09-29 | Disposition: A | Discharge status: Home Health | Payer: Self-pay | Source: Ambulatory Visit | Attending: Obstetrics | Admitting: Obstetrics

## 2011-09-28 DIAGNOSIS — O26899 Other specified pregnancy related conditions, unspecified trimester: Secondary | ICD-10-CM

## 2011-09-28 DIAGNOSIS — O219 Vomiting of pregnancy, unspecified: Secondary | ICD-10-CM

## 2011-09-28 DIAGNOSIS — O212 Late vomiting of pregnancy: Secondary | ICD-10-CM | POA: Insufficient documentation

## 2011-09-28 DIAGNOSIS — R197 Diarrhea, unspecified: Secondary | ICD-10-CM | POA: Insufficient documentation

## 2011-09-28 DIAGNOSIS — R109 Unspecified abdominal pain: Secondary | ICD-10-CM

## 2011-09-28 LAB — URINALYSIS, ROUTINE W REFLEX MICROSCOPIC
Ketones, ur: 40 mg/dL — AB
Nitrite: NEGATIVE
Specific Gravity, Urine: 1.025 (ref 1.005–1.030)
Urobilinogen, UA: 1 mg/dL (ref 0.0–1.0)

## 2011-09-28 LAB — CBC
MCV: 83.3 fL (ref 78.0–100.0)
Platelets: 286 10*3/uL (ref 150–400)
RDW: 14.2 % (ref 11.5–15.5)
WBC: 11.3 10*3/uL — ABNORMAL HIGH (ref 4.0–10.5)

## 2011-09-28 LAB — URINE MICROSCOPIC-ADD ON

## 2011-09-28 MED ORDER — OXYCODONE-ACETAMINOPHEN 5-325 MG PO TABS
2.0000 | ORAL_TABLET | Freq: Once | ORAL | Status: AC
  Administered 2011-09-28: 2 via ORAL
  Filled 2011-09-28: qty 2

## 2011-09-28 MED ORDER — CYCLOBENZAPRINE HCL 10 MG PO TABS
10.0000 mg | ORAL_TABLET | Freq: Once | ORAL | Status: AC
  Administered 2011-09-29: 10 mg via ORAL
  Filled 2011-09-28: qty 1

## 2011-09-28 MED ORDER — ONDANSETRON HCL 4 MG/2ML IJ SOLN
4.0000 mg | Freq: Once | INTRAMUSCULAR | Status: AC
  Administered 2011-09-28: 4 mg via INTRAVENOUS
  Filled 2011-09-28: qty 2

## 2011-09-28 MED ORDER — BETAMETHASONE SOD PHOS & ACET 6 (3-3) MG/ML IJ SUSP
12.0000 mg | Freq: Once | INTRAMUSCULAR | Status: AC
  Administered 2011-09-28: 12 mg via INTRAMUSCULAR
  Filled 2011-09-28: qty 2

## 2011-09-28 MED ORDER — LACTATED RINGERS IV BOLUS (SEPSIS)
1000.0000 mL | Freq: Once | INTRAVENOUS | Status: AC
  Administered 2011-09-28: 1000 mL via INTRAVENOUS

## 2011-09-28 NOTE — Progress Notes (Signed)
SAYS  HAD FEVER AT 4PM-  103.   TOOK BATH  NO TYLENOL.  CALLED DR MARSHALL-  TOLD HER TO COME HERE. HURTS IN HER BACK AND COMES TO FRONT.   BABY MOVED LESS TODAY.    VOMITED AT  8PM.   NO DIARRHEA.

## 2011-09-28 NOTE — ED Provider Notes (Signed)
History     No chief complaint on file.  HPI Pt is [redacted]weeks pregnant and complains of nausea and vomiting and diarrhea with continued lower abdominal pain and back pain that she was seen for on 2/28 and diagnosed with symphyseal separation. Her pain is worse with movement. Her pain is constant .  She has a history of preterm delivery.  Her cervical exam when she was here was" a little open" She last had diarrhea at 4 pm.   She last kept something down at 11 am. Pt states she had a fever of 103 at 4 pm.  She took a bath but no Tylenol.  Pt called Dr. Elsie Stain office today and talked with nurse who told pt to come to hospital for evaluation. Pt says the pregnancy girdle made pt feel worse.  Past Medical History  Diagnosis Date  . No pertinent past medical history   . Obese     Past Surgical History  Procedure Date  . No past surgeries     Family History  Problem Relation Age of Onset  . Anesthesia problems Neg Hx   . Hypotension Neg Hx   . Malignant hyperthermia Neg Hx   . Pseudochol deficiency Neg Hx     History  Substance Use Topics  . Smoking status: Never Smoker   . Smokeless tobacco: Not on file  . Alcohol Use: No    Allergies: No Known Allergies  Prescriptions prior to admission  Medication Sig Dispense Refill  . oxyCODONE-acetaminophen (TYLOX) 5-500 MG per capsule Take 1 capsule by mouth every 4 (four) hours as needed for pain.  30 capsule  0  . Prenatal Vit-Fe Fumarate-FA (PRENATAL MULTIVITAMIN) 60-1 MG tablet Take 1 tablet by mouth daily.  30 tablet  11    ROS Physical Exam   Blood pressure 122/70, pulse 122, temperature 99 F (37.2 C), temperature source Oral, resp. rate 22, height 5\' 1"  (1.549 m), weight 325 lb 4 oz (147.532 kg).  Physical Exam  Vitals reviewed. Constitutional: She is oriented to person, place, and time. She appears well-developed and well-nourished. She appears distressed.  HENT:  Head: Normocephalic.  Eyes: Pupils are equal, round, and  reactive to light.  Neck: Normal range of motion. Neck supple.  Cardiovascular: Normal rate.   Respiratory: Effort normal and breath sounds normal. She has no wheezes. She has no rales.  GI: Soft. There is tenderness. There is no rebound.       Massive pedunculated abdomen tender LLQ and RLQ with palpation, no rebound.  Bilateral lower back pain over hips- no CVA tenderness  Genitourinary:       Cervix 1 cm 50% effaced; -2 station  Musculoskeletal: Normal range of motion.  Neurological: She is alert and oriented to person, place, and time.  Skin: Skin is warm and dry.  Psychiatric: She has a normal mood and affect.    MAU Course  Procedures Reviewed chart from previous visit on 2/28 when pt was diagnosed with round ligament pain and separation of her pubic symphasis.  Fetal fibronectin was obtained at that time which was positive. Discussed with Dr. Gaynell Face- will give 1 liter of IVF, zofran 4mg  IV, Percocet and also will give BetaMethasone 12.5 mg IM and return to office tomorrow for 2nd dose No ctx palpated; FHR reactive strip. Percocet 2 tablets given without relief of pain- will give Flexeril Assessment and Plan  Abdominal pain in pregnancy- pt has pain med at home Symphyseal separation- will try Flexeril prescription in  addition to percocet Nausea and vomiting- will give Zofran 8 mg ODT presciption Positive fetal fibronectin- BetaMEthasone 12 mg IM given- to f/u tomorrow at Dr. Elsie Stain office for 2nd dose  Hanifa Antonetti 09/28/2011, 9:11 PM

## 2011-09-29 ENCOUNTER — Encounter (HOSPITAL_COMMUNITY): Payer: Self-pay | Admitting: *Deleted

## 2011-09-29 ENCOUNTER — Inpatient Hospital Stay (HOSPITAL_COMMUNITY)
Admission: AD | Admit: 2011-09-29 | Discharge: 2011-09-29 | Disposition: A | Discharge status: Home / Self Care | Payer: Self-pay | Source: Ambulatory Visit | Attending: Obstetrics | Admitting: Obstetrics

## 2011-09-29 DIAGNOSIS — O4700 False labor before 37 completed weeks of gestation, unspecified trimester: Secondary | ICD-10-CM | POA: Insufficient documentation

## 2011-09-29 DIAGNOSIS — N949 Unspecified condition associated with female genital organs and menstrual cycle: Secondary | ICD-10-CM

## 2011-09-29 MED ORDER — CYCLOBENZAPRINE HCL 10 MG PO TABS: 10.0000 mg | ORAL_TABLET | Freq: Once | ORAL | Status: DC

## 2011-09-29 MED ORDER — BETAMETHASONE SOD PHOS & ACET 6 (3-3) MG/ML IJ SUSP
12.0000 mg | Freq: Once | INTRAMUSCULAR | Status: AC
  Administered 2011-09-29: 12 mg via INTRAMUSCULAR
  Filled 2011-09-29: qty 2

## 2011-09-29 MED ORDER — ONDANSETRON 8 MG PO TBDP: 8.0000 mg | ORAL_TABLET | Freq: Three times a day (TID) | ORAL | Status: DC | PRN

## 2011-09-29 NOTE — Progress Notes (Signed)
Pt states, " I was here last night and Dr Gaynell Face told me to come back for a second injection. I am still having some cramping. I was 1/50 in the office today."

## 2011-10-01 ENCOUNTER — Inpatient Hospital Stay (HOSPITAL_COMMUNITY)
Admission: AD | Admit: 2011-10-01 | Discharge: 2011-10-01 | Disposition: A | Discharge status: Home / Self Care | Payer: Self-pay | Source: Ambulatory Visit | Attending: Obstetrics | Admitting: Obstetrics

## 2011-10-01 ENCOUNTER — Encounter (HOSPITAL_COMMUNITY): Payer: Self-pay | Admitting: *Deleted

## 2011-10-01 DIAGNOSIS — R109 Unspecified abdominal pain: Secondary | ICD-10-CM | POA: Insufficient documentation

## 2011-10-01 DIAGNOSIS — O219 Vomiting of pregnancy, unspecified: Secondary | ICD-10-CM

## 2011-10-01 DIAGNOSIS — O212 Late vomiting of pregnancy: Secondary | ICD-10-CM | POA: Insufficient documentation

## 2011-10-01 DIAGNOSIS — N949 Unspecified condition associated with female genital organs and menstrual cycle: Secondary | ICD-10-CM

## 2011-10-01 DIAGNOSIS — M549 Dorsalgia, unspecified: Secondary | ICD-10-CM

## 2011-10-01 DIAGNOSIS — O26899 Other specified pregnancy related conditions, unspecified trimester: Secondary | ICD-10-CM

## 2011-10-01 MED ORDER — MORPHINE SULFATE 10 MG/ML IJ SOLN
10.0000 mg | INTRAMUSCULAR | Status: AC
  Administered 2011-10-01: 10 mg via INTRAMUSCULAR
  Filled 2011-10-01: qty 1

## 2011-10-01 MED ORDER — PROMETHAZINE HCL 25 MG/ML IJ SOLN
25.0000 mg | INTRAMUSCULAR | Status: AC
  Administered 2011-10-01: 25 mg via INTRAMUSCULAR
  Filled 2011-10-01: qty 1

## 2011-10-01 NOTE — ED Provider Notes (Signed)
Chief Complaint:  Abdominal pain  HPI  Shelley Bradley is  36 y.o. [id number] at [redacted]w[redacted]d presents with abdominal pain and n/v. She is SOB upon arrival and feels dizzy.  She reports pain for several weeks that is worse today, described as pressure in her lower abdomen that is intermittent and worse with walking.  She does not want any more pills and reports that nothing she has done has helped this pain.  She does not want any more labs taken "because I have already had these and they were all normal".  She has not taken pain medication today and has not picked up prescription for Flexeril.  She wants to know why she cannot just have the baby today.  She denies LOF, vaginal bleeding, vaginal itching/burning, or urinary symptoms.     Obstetrical/Gynecological History: OB History    Grav Para Term Preterm Abortions TAB SAB Ect Mult Living   5 2 1 1 2  0 2 0 0 2      Past Medical History: Past Medical History  Diagnosis Date  . No pertinent past medical history   . Obese     Past Surgical History: Past Surgical History  Procedure Date  . No past surgeries     Family History: Family History  Problem Relation Age of Onset  . Anesthesia problems Neg Hx   . Hypotension Neg Hx   . Malignant hyperthermia Neg Hx   . Pseudochol deficiency Neg Hx     Social History: History  Substance Use Topics  . Smoking status: Never Smoker   . Smokeless tobacco: Not on file  . Alcohol Use: No    Allergies: No Known Allergies  Meds:  Prescriptions prior to admission  Medication Sig Dispense Refill  . ondansetron (ZOFRAN ODT) 8 MG disintegrating tablet Take 1 tablet (8 mg total) by mouth every 8 (eight) hours as needed for nausea.  20 tablet  0  . oxyCODONE-acetaminophen (TYLOX) 5-500 MG per capsule Take 1 capsule by mouth every 4 (four) hours as needed for pain.  30 capsule  0  . Prenatal Vit-Fe Fumarate-FA (PRENATAL MULTIVITAMIN) 60-1 MG tablet Take 1 tablet by mouth daily.  30 tablet  11     Review of Systems See HPI   Physical Exam  Blood pressure 114/68, pulse 104, resp. rate 20, height 5' 2.5" (1.588 m), weight 150.141 kg (331 lb). GENERAL: Well-developed, well-nourished female in no acute distress.  LUNGS: Clear to auscultation bilaterally.  HEART: Regular rate and rhythm. ABDOMEN: Pendulous abdomen with morbid obesity, Soft, tender in suprapubic region and over pubic symphysis, nondistended, gravid.  EXTREMITIES: Nontender, no edema, 2+ distal pulses. Cervical Exam: 1.5/50/-3, posterior Presentation: not assessed FHT:  Baseline rate 145 bpm   Variability moderate  Accelerations present   Decelerations none Contractions: Irregular, 1 every 10 minutes   Labs: No results found for this or any previous visit (from the past 24 hour(s)). Imaging Studies:  Not indicated Last ultrasound on 09/24/11  Assessment/Plan: Called Dr Clearance Coots with pt history, physical exam.  He agrees with following POC:  Morphine 10mg /Phenergan 25 mg IM x1 dose in MAU D/C home with PTL precautions Encouraged pt to pick up Flexeril prescription and start tomorrow F/U with Dr Gaynell Face tomorrow Return to MAU as needed      LEFTWICH-KIRBY, Shelley Bradley 3/7/20136:13 PM

## 2011-10-01 NOTE — ED Notes (Signed)
When pt lied down on the bed she stated that she couldn't breathe but she was actually hyperventilating;  She passed out and RN used an ammonia inhaler to arouse her;

## 2011-10-01 NOTE — Discharge Instructions (Signed)
Dolor abdominal en el embarazo  (Abdominal Pain During Pregnancy) Las molestias abdominales son frecuentes Academic librarian. Generalmente no causan ningn dao. Puede tener numerosas causas. Algunas causas son ms graves que otras. Ciertas causas se diagnostican fcilmente. En algunos casos, se demora algn tiempo para comprender el diagnstico. Otras veces la causa no se conoce. El dolor abdominal puede ser un signo de que algo no anda bien en el [place] puede ser debido a una causa totalmente diferente. Por este motivo, siempre comente a su mdico cuando sienta molestias abdominales.  CAUSAS  Las causas ms frecuentes y que no causan ningn dao son:   Constipacin.   Exceso de gases y meteorismo.   Dolor en el ligamento redondo. Este dolor se siente en los pliegues de la ingle.   La posicin en que se encuentra el beb o la placenta.   Las pataditas del beb.   Contracciones de Braxton-Hicks. Estas son contracciones suaves que no producen dilatacin del cuello.  Otras causas graves de dolor abdominal son:   Vanetta Mulders ectpico Se produce cuando un vulo fertilizado se implanta fuera del tero.   Aborto espontneo.   Parto prematuro. El parto prematuro comienza antes de la semana 37 de [place].   Desprendimiento de la placenta. Ocurre cuando la placenta se separa parcial o completamente del tero.   Preeclampsia Generalmente se asocia a hipertensin arterial y tambin se denomina "toxemia del embarazo".   Infecciones del tero o del lquido amnitico.  Las causas que no se relacionan con Firefighter son:   Infeccin del tracto urinario.   Clculos o inflamacin de la vescula.   Hepatitis u otras enfermedades del hgado.   Trastornos intestinales, virus en el estmago, intoxicacin alimentaria, lcera.   Apendicitis.   Clculos en el rin (renales).   Infeccin renal (pielonefritis).  INSTRUCCIONES PARA EL CUIDADO EN EL HOGAR  Si el dolor es leve:   No tenga  relaciones sexuales y no coloque nada dentro de la vagina hasta que los sntomas hayan desaparecido completamente.   Descanse todo lo que pueda hasta que el dolor haya calmado. Si el dolor no mejora en una hora, comunquese con su mdico.   Si siente nuseas, beba lquidos claros. Evite los alimentos slidos hasta que no sienta Dentist en el estmago o desaparezcan las nuseas.   Tome slo la medicacin que le indic el profesional.   Concurra puntualmente a las citas con el mdico.  SOLICITE ATENCIN MDICA DE INMEDIATO SI:   Tiene un sangrado, prdida de lquidos o lo observa al limpiarse la vagina con tis.   El dolor o los clicos [place].   Tiene vmitos persistentes.   Comienza a Financial risk analyst al orinar u [employer].   Tiene fiebre.   Los movimientos del beb disminuyen.   Nota un debilitamiento extremo o se marea.   Tiene dificultad para respirar con o sin dolor abdominal.   Siente un dolor de cabeza intenso junto al dolor abdominal.   Tiene secrecin vaginal con dolor abdominal.   Tiene diarrea persistente.   El dolor abdominal sigue an despus de Field seismologist.  ASEGRESE DE QUE:   Comprende estas instrucciones.   Controlar su enfermedad.   Solicitar ayuda de inmediato si no mejora o si empeora.  Document Released: 07/13/2005 Document Revised: 07/02/2011 [facility] Patient Information 2012 [place], [place].

## 2011-10-01 NOTE — Progress Notes (Signed)
Pt stated she fekt like she had to push. SVE 1-1.5/30/-3 FFN collected.

## 2011-10-01 NOTE — ED Notes (Signed)
L.Leftwich-Kirby,CNM at bedside with interpertor  At bedside to discuss POC. Pt does not want a pelvic exam or "pills" or IV ffluid. Stated she has been here 3 times and doesn not feel any better. Will discuss with DR. [facility] POC

## 2011-10-02 ENCOUNTER — Inpatient Hospital Stay (HOSPITAL_COMMUNITY): Payer: Self-pay

## 2011-10-02 ENCOUNTER — Inpatient Hospital Stay (HOSPITAL_COMMUNITY)
Admission: AD | Admit: 2011-10-02 | Discharge: 2011-10-02 | Disposition: A | Discharge status: Home / Self Care | Payer: Self-pay | Source: Ambulatory Visit | Attending: Obstetrics & Gynecology | Admitting: Obstetrics & Gynecology

## 2011-10-02 ENCOUNTER — Encounter (HOSPITAL_COMMUNITY): Payer: Self-pay

## 2011-10-02 DIAGNOSIS — O210 Mild hyperemesis gravidarum: Secondary | ICD-10-CM | POA: Insufficient documentation

## 2011-10-02 DIAGNOSIS — M549 Dorsalgia, unspecified: Secondary | ICD-10-CM | POA: Insufficient documentation

## 2011-10-02 DIAGNOSIS — O219 Vomiting of pregnancy, unspecified: Secondary | ICD-10-CM

## 2011-10-02 DIAGNOSIS — O99891 Other specified diseases and conditions complicating pregnancy: Secondary | ICD-10-CM | POA: Insufficient documentation

## 2011-10-02 LAB — URINALYSIS, ROUTINE W REFLEX MICROSCOPIC
Glucose, UA: NEGATIVE mg/dL
Leukocytes, UA: NEGATIVE
Specific Gravity, Urine: >1.03 — ABNORMAL HIGH (ref 1.005–1.030)
pH: 6.5 (ref 5.0–8.0)

## 2011-10-02 LAB — URINE MICROSCOPIC-ADD ON

## 2011-10-02 MED ORDER — HYDROMORPHONE HCL PF 1 MG/ML IJ SOLN
2.0000 mg | Freq: Once | INTRAMUSCULAR | Status: AC
  Administered 2011-10-02: 2 mg via INTRAVENOUS
  Filled 2011-10-02: qty 2

## 2011-10-02 MED ORDER — ONDANSETRON 8 MG PO TBDP: 8.0000 mg | ORAL_TABLET | Freq: Three times a day (TID) | ORAL | Status: AC | PRN

## 2011-10-02 MED ORDER — CYCLOBENZAPRINE HCL 10 MG PO TABS: 10.0000 mg | ORAL_TABLET | Freq: Three times a day (TID) | ORAL | Status: DC | PRN

## 2011-10-02 MED ORDER — CYCLOBENZAPRINE HCL 10 MG PO TABS
10.0000 mg | ORAL_TABLET | Freq: Once | ORAL | Status: AC
  Administered 2011-10-02: 10 mg via ORAL
  Filled 2011-10-02: qty 1

## 2011-10-02 MED ORDER — CYCLOBENZAPRINE HCL 10 MG PO TABS: 10.0000 mg | ORAL_TABLET | Freq: Three times a day (TID) | ORAL | Status: AC | PRN

## 2011-10-02 MED ORDER — LACTATED RINGERS IV BOLUS (SEPSIS)
1000.0000 mL | Freq: Once | INTRAVENOUS | Status: AC
  Administered 2011-10-02: 1000 mL via INTRAVENOUS

## 2011-10-02 NOTE — ED Notes (Signed)
Discharge instructions were reviewed in great detail using in house spanish interpreter Maday, reinforced the need for patient to get prescriptions for Flexeril; and Zofran and take take these, explained to patient that it is too early for her to have her baby the risks involved to the fetus, patient verbalized an understanding and her family members present stated that would get her medications filled.

## 2011-10-02 NOTE — Progress Notes (Signed)
Patient arrived EMS with c/o back pain and vomiting since this morning.

## 2011-10-02 NOTE — ED Provider Notes (Signed)
History     Chief Complaint  Patient presents with  . Back Pain  . Emesis During Pregnancy   HPI  Patient arrived EMS with c/o back pain and vomiting since this morning.  Reports intermittent back pain that comes an goes.  Denies vaginal bleeding or leaking of fluid.  Pain is described as sharp and radiates from mid upper back down to lower back.  +vomiting.   Past Medical History  Diagnosis Date  . No pertinent past medical history   . Obese     Past Surgical History  Procedure Date  . No past surgeries     Family History  Problem Relation Age of Onset  . Anesthesia problems Neg Hx   . Hypotension Neg Hx   . Malignant hyperthermia Neg Hx   . Pseudochol deficiency Neg Hx     History  Substance Use Topics  . Smoking status: Never Smoker   . Smokeless tobacco: Not on file  . Alcohol Use: No    Allergies: No Known Allergies  Prescriptions prior to admission  Medication Sig Dispense Refill  . ondansetron (ZOFRAN ODT) 8 MG disintegrating tablet Take 1 tablet (8 mg total) by mouth every 8 (eight) hours as needed for nausea.  20 tablet  0  . oxyCODONE-acetaminophen (TYLOX) 5-500 MG per capsule Take 1 capsule by mouth every 4 (four) hours as needed for pain.  30 capsule  0  . Prenatal Vit-Fe Fumarate-FA (PRENATAL MULTIVITAMIN) 60-1 MG tablet Take 1 tablet by mouth daily.  30 tablet  11    Review of Systems  Gastrointestinal: Positive for nausea and vomiting.  Musculoskeletal: Positive for back pain.  All other systems reviewed and are negative.   Physical Exam   Blood pressure 107/63, pulse 109, temperature 98.5 F (36.9 C), resp. rate 16, SpO2 100.00%.  Physical Exam  Constitutional: She is oriented to person, place, and time. She appears well-developed and well-nourished. She appears distressed (pt moving around in bed).  HENT:  Head: Normocephalic.  Neck: Normal range of motion. Neck supple.  Cardiovascular: Normal rate, regular rhythm and normal heart  sounds.   Respiratory: Effort normal and breath sounds normal.  GI: Soft. There is no tenderness.  Genitourinary: No bleeding around the vagina. Vaginal discharge (mucusy) found.       2/40/-3  Neurological: She is alert and oriented to person, place, and time.  Skin: Skin is warm and dry.   Cervix recheck - no change  MAU Course  Procedures  BPP - 8/8 Renal ultrasound - normal  IV Dilaudid - 2 mg Flexeril 10 mg PO Consulted with Dr. Gaynell Face, if renal ultrasound normal>discharge home with RX for Flexeril   Assessment and Plan  Back Pain Nausea of pregnancy  Plan: DC to home RX Flexeril and Zofran F/U with Dr. Gaynell Face next week Preterm labor precautions  [facility] 10/02/2011, 3:40 PM

## 2011-10-02 NOTE — Progress Notes (Signed)
[facility] Department of Clinical Social Work Documentation of Interpretation   I assisted ___Sheryll RN________________ with interpretation of ____questions__________________ for this patient.

## 2011-10-02 NOTE — ED Notes (Signed)
Patient c/o increased pain notified W. Muhammed CNM

## 2011-10-02 NOTE — Discharge Instructions (Signed)
Dolor de Merchandiser, retail  (Back Pain in Pregnancy)  El dolor de espalda es habitual durante el embarazo. Ocurre en aproximadamente la mitad de todos los [place]. Es importante para usted y su beb que permanezca activa durante el [place].Si siente que Chief Technology Officer de espalda es lo que no le permite mantenerse activa o dormir bien, Scientist, clinical (histocompatibility and immunogenetics) a su mdico. La causa del dolor de espalda puede deberse a varios factores relacionados con los cambios durante el [place].Afortunadamente, excepto que haya tenido problemas de espalda antes del [place], es probable que el dolor mejore despus del [place]. El dolor lumbar por lo general ocurre entre el quinto y sptimo mes del Psychiatrist. Sin embargo, puede ocurrir [employer] primeros meses. Otros factores que aumentan el riesgo son:   Problemas previos en la espalda.   Lesiones en la espalda.   Tener gemelos o embarazos mltiples.   Tos persistente.   El estrs.   Movimientos repetitivos relacionados con Kathie Dike.   Enfermedad muscular o de la columna vertebral en la espalda.   Antecedentes familiares de problemas de espalda, rotura (hernia) de discos u osteoporosis.   Depresin, ansiedad y crisis de [place].  CAUSAS   En las embarazadas, el cuerpo produce una hormona llamada relaxina. Esta hormona hace que los ligamentos que conectan la zona lumbar y los huesos del pubis sean ms flexibles. Esta flexibilidad permite que el beb nazca con ms facilidad. Cuando los ligamentos estn relajados, los msculos tienen que trabajar ms para apoyar la espalda. El dolor en la espalda puede deberse al cansancio muscular. El dolor tambin puede tener su causa en la irritacin de los tejidos de a espalda que se irritan ya que estn recibiendo menos apoyo.   A medida que el beb crece, ejerce presin [employer] nervios y los vasos sanguneos de la pelvis. Esto causa dolor de espalda.   A medida que el beb crece y [address] durante el  [place], el tero presiona los msculos del estmago hacia adelante y [place] su centro de gravedad. Esto hace que los msculos de la espalda deban trabajar ms para mantener una buena [place].  SNTOMAS  Dolor lumbar durante el embarazo Generalmente se produce en la zona o por arriba de la cintura en el centro de la espalda. Puede haber dolor y entumecimiento que se irradia hacia la pierna o el pie. Es similar al dolor de espalda baja experimentada por las mujeres no embarazadas. Por lo general, aumenta al [employer] de pie o sentada por largos perodos de [place], o con levantamientos repetitivos Tambin puede haber sensibilidad en los msculos en la zona superior de la espalda .  Dolor plvico posterior Environmental consultant en la parte posterior de la pelvis es ms frecuente que el dolor lumbar en el embarazo. Se trata de un dolor profundo que se siente a un lado en la cintura, o a travs del cxis (sacro), o en ambos lugares. Puede sentir dolor en uno o ambos lados Este dolor tambin puede sentirse en las nalgas y el dorso de los muslos Tambin puede haber dolor pbico y en la ingle. El dolor no se mejora rpidamente con el reposo, y [place] puede haber rigidez matutina. Muchas actividades pueden causarlo. Un buen estado fsico antes y [address] puede o no prevenir este problema. Las contracciones del parto suelen aparecer cada 1 a 2 minutos, tienen una duracin de aproximadamente 1 minuto, e implica una sensacin de empujar o presin en la pelvis. Sin  embargo, si usted est a trmino con Firefighter, Chief Technology Officer constante en la zona lumbar puede indicar el comienzo de un parto prematuro, y usted debe ser consciente de ello.  DIAGNSTICO  No se deben tomar radiografas de la [employer] las primeras 12 a 14 semanas del embarazo y durante el resto del Psychiatrist, slo cuando sea absolutamente necesario. La resonancia magntica no emite radiacin y es un estudio seguro durante el Psychiatrist.  Pero tambin se deben hacer solamente cuando sea absolutamente necesario.  INSTRUCCIONES PARA EL CUIDADO EN EL HOGAR   Realice actividad fsica segn las indicaciones del mdico. El ejercicio es la manera ms eficaz para prevenir o tratar Chief Technology Officer de espalda. Si tiene un problema en la espalda, es especialmente importante evitar los deportes que requieran de movimientos corporales rpidos. La natacin y las caminatas son las mejores [address].   No permanezca sentada o de pie en el mismo lugar durante largos perodos.   No use tacos altos.   Sintese en la silla con una buena postura. Use una almohada en su espalda baja si es necesario. Asegrese de que su cabeza descansa sobre sus hombros y no est colgando hacia delante.   Trate de dormir de lado, de preferencia el lado izquierdo, con una o [employer] piernas. Si est dolorida despus de una noche de descanso, la cama puede ser [employer].Trate de colocar una tabla entre el colchn y el somier.   Prstele atencin a su cuerpo cuando se levante.Si siente dolor,pida ayuda o trate de doblar las rodillas ms para [employer] de las piernas en lugar de los msculos de la espalda. Pngase en cuclillas al levantar algo del suelo. No se doble.   Consuma una dieta saludable. Trate de aumentar de peso dentro de las recomendaciones de su mdico.   Utilice compresas de calor o fro de 3 a 4 veces al da durante 15 minutos para Primary school teacher.   Solo tome medicamentos que se pueden comprar sin receta o recetados para Chief Technology Officer, Dentist o fiebre, como le indica el mdico.  Dolor de espaldas repentino (agudo).  Haga reposo en cama slo en caso de los episodios ms extremos y agudos de Engineer, mining. El reposo prolongado en cama de ms de 48 horas agravar su trastorno.   El hielo es muy efectivo en los problemas agudos.   Ponga el hielo en una bolsa plstica.   Colquese una toalla entre la piel y la bolsa de hielo.   Deje  el hielo durante 10 a 20 minutos cada 2 horas o segn lo nesecite, mientras se encuentre despierta.   Las compresas de calor durante 30 minutos antes de las actividades tambin puede ayudar.  Dolor crnico en la espalda. Consulte a su mdico si el dolor es continuo. El mdico podr ayudarla o derivarla para que realice los ejercicios y trabajos de fortalecimiento apropiados. Con un buen entrenamiento fsico, podr evitar la mayor parte de los [place]. En algunos casos, la causa es un problema ms grave. Debe ser controlada inmediatamente si aparecen nuevos problemas. El mdico tambin podr recomendar:   Una faja de maternidad.   Un arns elstico.   Un cors para la espalda.   Un masajista o acupuntura.  SOLICITE ATENCIN MDICA SI:   No puede Careers information officer de sus actividades diarias, an tomando los medicamentos para Psychologist, occupational.   Beverlee Nims ser derivada a un fisioterapeuta o quiroprxico.   Beverlee Nims intentar con  acupuntura.  SOLICITE ATENCIN MDICA DE INMEDIATO SI:   Siente entumecimiento, hormigueo, debilidad o problemas con el uso de los brazos o las piernas.   Siente un dolor de espalda muy intenso que no se alivia con medicamentos.   Tiene modificaciones repentinas en el control de la vejiga o el intestino.   Aumenta el dolor en otras partes del cuerpo.   Siente que le falta el aire, se marea o sufre un [place].   Tiene nuseas, vmitos o sudoracin.   Siente un dolor en la espalda similar al del [place] de [place].   Cuando aparece [employer], rompe la bolsa de aguas o tiene un sangrado vaginal.   El dolor o el adormecimiento se extienden hacia la pierna.   El dolor aparece despus de una cada.   Siente dolor de un solo lado. Podra tener clculos renales.   Observa sangre en la orina. Podra tener una infeccin en la vejiga o clculos renales.   Siente dolor y aparecen ronchas. Podra tener culebrilla.  El dolor de espalda es bastante  frecuente durante el embarazo pero no debe aceptarse slo como parte del [place]. Siempre debe tratarse lo ms rpidamente posible. Har que su embarazo sea lo ms placentero posible.  Document Released: 03/25/2011 Document Revised: 07/02/2011 [facility] Patient Information 2012 [place], [place].

## 2011-10-25 ENCOUNTER — Encounter (HOSPITAL_COMMUNITY): Payer: Self-pay | Admitting: *Deleted

## 2011-10-25 ENCOUNTER — Inpatient Hospital Stay (HOSPITAL_COMMUNITY)
Admission: AD | Admit: 2011-10-25 | Discharge: 2011-10-25 | Disposition: A | Discharge status: Home / Self Care | Payer: Self-pay | Source: Ambulatory Visit | Attending: Obstetrics | Admitting: Obstetrics

## 2011-10-25 DIAGNOSIS — O479 False labor, unspecified: Secondary | ICD-10-CM | POA: Insufficient documentation

## 2011-10-25 MED ORDER — PROMETHAZINE HCL 25 MG/ML IJ SOLN
25.0000 mg | Freq: Once | INTRAMUSCULAR | Status: AC
  Administered 2011-10-25: 25 mg via INTRAMUSCULAR
  Filled 2011-10-25: qty 1

## 2011-10-25 MED ORDER — OXYCODONE-ACETAMINOPHEN 5-325 MG PO TABS
2.0000 | ORAL_TABLET | Freq: Once | ORAL | Status: AC
  Administered 2011-10-25: 2 via ORAL
  Filled 2011-10-25 (×2): qty 2

## 2011-10-25 MED ORDER — MORPHINE SULFATE 10 MG/ML IJ SOLN
10.0000 mg | Freq: Once | INTRAMUSCULAR | Status: AC
  Administered 2011-10-25: 10 mg via INTRAMUSCULAR
  Filled 2011-10-25: qty 1

## 2011-10-25 MED ORDER — LACTATED RINGERS IV BOLUS (SEPSIS): 1000.0000 mL | Freq: Once | INTRAVENOUS | Status: DC

## 2011-10-25 NOTE — MAU Note (Signed)
Called dr Clearance Coots informed of pt status, orders received to give 2 percocet, and rechck cervix in one hour

## 2011-10-25 NOTE — Progress Notes (Signed)
Called dr Clearance Coots, informed of sve, to give IV fluids and then discharge home

## 2011-10-25 NOTE — Progress Notes (Signed)
Called dr Clearance Coots, informed of sve, to continue to obs pt

## 2011-10-25 NOTE — Discharge Instructions (Signed)
Keep your next appointment with Dr. Gaynell Face next week.  If you think you are in labor before your next appointment, please return to Maternity Admissions Unit for re-evaluation.

## 2011-10-25 NOTE — Progress Notes (Signed)
Called dr Clearance Coots, informed that pt is still in a lot of pain, to give another bag of fluid and pain meds order given. To re evaluate pt in another hour.

## 2011-10-27 ENCOUNTER — Encounter (HOSPITAL_COMMUNITY): Payer: Self-pay | Admitting: *Deleted

## 2011-10-27 ENCOUNTER — Inpatient Hospital Stay (HOSPITAL_COMMUNITY)
Admission: AD | Admit: 2011-10-27 | Discharge: 2011-10-30 | DRG: 775 | Disposition: A | Discharge status: Home / Self Care | Payer: Medicaid Other | Source: Ambulatory Visit | Attending: Obstetrics | Admitting: Obstetrics

## 2011-10-27 LAB — CBC
MCH: 26.7 pg (ref 26.0–34.0)
MCV: 83.5 fL (ref 78.0–100.0)
Platelets: 287 10*3/uL (ref 150–400)
RBC: 3.82 MIL/uL — ABNORMAL LOW (ref 3.87–5.11)
RDW: 15.3 % (ref 11.5–15.5)
WBC: 7 10*3/uL (ref 4.0–10.5)

## 2011-10-27 LAB — POCT FERN TEST: Fern Test: POSITIVE

## 2011-10-27 MED ORDER — LIDOCAINE HCL (PF) 1 % IJ SOLN
30.0000 mL | INTRAMUSCULAR | Status: DC | PRN
  Filled 2011-10-27: qty 30

## 2011-10-27 MED ORDER — ACETAMINOPHEN 325 MG PO TABS: 650.0000 mg | ORAL_TABLET | ORAL | Status: DC | PRN

## 2011-10-27 MED ORDER — PENICILLIN G POTASSIUM 5000000 UNITS IJ SOLR
2.5000 10*6.[IU] | INTRAVENOUS | Status: DC
  Administered 2011-10-28 (×2): 2.5 10*6.[IU] via INTRAVENOUS
  Filled 2011-10-27 (×4): qty 2.5

## 2011-10-27 MED ORDER — BUTORPHANOL TARTRATE 2 MG/ML IJ SOLN: 1.0000 mg | INTRAMUSCULAR | Status: DC | PRN

## 2011-10-27 MED ORDER — IBUPROFEN 600 MG PO TABS
600.0000 mg | ORAL_TABLET | Freq: Four times a day (QID) | ORAL | Status: DC | PRN
  Filled 2011-10-27 (×4): qty 1

## 2011-10-27 MED ORDER — LACTATED RINGERS IV SOLN: 500.0000 mL | INTRAVENOUS | Status: DC | PRN

## 2011-10-27 MED ORDER — ONDANSETRON HCL 4 MG/2ML IJ SOLN: 4.0000 mg | Freq: Four times a day (QID) | INTRAMUSCULAR | Status: DC | PRN

## 2011-10-27 MED ORDER — OXYTOCIN BOLUS FROM INFUSION
500.0000 mL | Freq: Once | INTRAVENOUS | Status: DC
  Filled 2011-10-27: qty 500

## 2011-10-27 MED ORDER — OXYTOCIN 20 UNITS IN LACTATED RINGERS INFUSION - SIMPLE
125.0000 mL/h | Freq: Once | INTRAVENOUS | Status: AC
  Administered 2011-10-28: 999 mL/h via INTRAVENOUS

## 2011-10-27 MED ORDER — FLEET ENEMA 7-19 GM/118ML RE ENEM: 1.0000 | ENEMA | RECTAL | Status: DC | PRN

## 2011-10-27 MED ORDER — PENICILLIN G POTASSIUM 5000000 UNITS IJ SOLR
5.0000 10*6.[IU] | Freq: Once | INTRAVENOUS | Status: AC
  Administered 2011-10-27: 5 10*6.[IU] via INTRAVENOUS
  Filled 2011-10-27: qty 5

## 2011-10-27 MED ORDER — LACTATED RINGERS IV SOLN
INTRAVENOUS | Status: DC
  Administered 2011-10-27: 22:00:00 via INTRAVENOUS

## 2011-10-27 MED ORDER — CITRIC ACID-SODIUM CITRATE 334-500 MG/5ML PO SOLN: 30.0000 mL | ORAL | Status: DC | PRN

## 2011-10-27 MED ORDER — OXYCODONE-ACETAMINOPHEN 5-325 MG PO TABS
1.0000 | ORAL_TABLET | ORAL | Status: DC | PRN
  Administered 2011-10-29: 2 via ORAL
  Administered 2011-10-29: 1 via ORAL
  Administered 2011-10-30: 2 via ORAL
  Filled 2011-10-27: qty 2
  Filled 2011-10-27: qty 1

## 2011-10-27 MED ORDER — PROMETHAZINE HCL 25 MG/ML IJ SOLN: 12.5000 mg | Freq: Four times a day (QID) | INTRAMUSCULAR | Status: DC | PRN

## 2011-10-27 NOTE — ED Notes (Signed)
Pt back from walking SVE pt very wet clear fluid leaking FERn test Positive.

## 2011-10-27 NOTE — Progress Notes (Signed)
Notified of pt ctx q 4-5 min and veyr uncomfortable SVE unchanged from office last week. Have pt walk x 2 hrs in hall and recheck.

## 2011-10-27 NOTE — MAU Note (Signed)
Pt reports having contractions x 3 days. Reports leaking and bloody show and good fetal movement.

## 2011-10-27 NOTE — Progress Notes (Signed)
Notified of positive fern test and SVE. Orders to admit to L&D

## 2011-10-28 ENCOUNTER — Encounter (HOSPITAL_COMMUNITY): Payer: Self-pay | Admitting: Anesthesiology

## 2011-10-28 ENCOUNTER — Encounter (HOSPITAL_COMMUNITY): Payer: Self-pay | Admitting: *Deleted

## 2011-10-28 ENCOUNTER — Inpatient Hospital Stay (HOSPITAL_COMMUNITY): Payer: Medicaid Other | Admitting: Anesthesiology

## 2011-10-28 LAB — HIV ANTIBODY (ROUTINE TESTING W REFLEX): HIV: NONREACTIVE

## 2011-10-28 LAB — RPR: RPR Ser Ql: NONREACTIVE

## 2011-10-28 MED ORDER — PRENATAL MULTIVITAMIN CH
1.0000 | ORAL_TABLET | Freq: Every day | ORAL | Status: DC
  Administered 2011-10-28 – 2011-10-30 (×3): 1 via ORAL
  Filled 2011-10-28 (×3): qty 1

## 2011-10-28 MED ORDER — EPHEDRINE 5 MG/ML INJ
10.0000 mg | INTRAVENOUS | Status: DC | PRN
  Filled 2011-10-28: qty 4

## 2011-10-28 MED ORDER — DIBUCAINE 1 % RE OINT: 1.0000 "application " | TOPICAL_OINTMENT | RECTAL | Status: DC | PRN

## 2011-10-28 MED ORDER — OXYTOCIN 20 UNITS IN LACTATED RINGERS INFUSION - SIMPLE
1.0000 m[IU]/min | INTRAVENOUS | Status: DC
  Administered 2011-10-28: 1 m[IU]/min via INTRAVENOUS
  Administered 2011-10-28: 5 m[IU]/min via INTRAVENOUS
  Filled 2011-10-28: qty 1000

## 2011-10-28 MED ORDER — PHENYLEPHRINE 40 MCG/ML (10ML) SYRINGE FOR IV PUSH (FOR BLOOD PRESSURE SUPPORT)
80.0000 ug | PREFILLED_SYRINGE | INTRAVENOUS | Status: DC | PRN
  Filled 2011-10-28: qty 5

## 2011-10-28 MED ORDER — LACTATED RINGERS IV SOLN: 500.0000 mL | Freq: Once | INTRAVENOUS | Status: DC

## 2011-10-28 MED ORDER — FENTANYL 2.5 MCG/ML BUPIVACAINE 1/10 % EPIDURAL INFUSION (WH - ANES)
INTRAMUSCULAR | Status: DC | PRN
  Administered 2011-10-28: 14 mL/h via EPIDURAL

## 2011-10-28 MED ORDER — LANOLIN HYDROUS EX OINT: TOPICAL_OINTMENT | CUTANEOUS | Status: DC | PRN

## 2011-10-28 MED ORDER — ONDANSETRON HCL 4 MG PO TABS: 4.0000 mg | ORAL_TABLET | ORAL | Status: DC | PRN

## 2011-10-28 MED ORDER — TERBUTALINE SULFATE 1 MG/ML IJ SOLN: 0.2500 mg | Freq: Once | INTRAMUSCULAR | Status: DC | PRN

## 2011-10-28 MED ORDER — WITCH HAZEL-GLYCERIN EX PADS: 1.0000 "application " | MEDICATED_PAD | CUTANEOUS | Status: DC | PRN

## 2011-10-28 MED ORDER — DIPHENHYDRAMINE HCL 50 MG/ML IJ SOLN: 12.5000 mg | INTRAMUSCULAR | Status: DC | PRN

## 2011-10-28 MED ORDER — LIDOCAINE HCL (PF) 1 % IJ SOLN
INTRAMUSCULAR | Status: DC | PRN
  Administered 2011-10-28: 8 mL
  Administered 2011-10-28: 99 mL

## 2011-10-28 MED ORDER — SIMETHICONE 80 MG PO CHEW: 80.0000 mg | CHEWABLE_TABLET | ORAL | Status: DC | PRN

## 2011-10-28 MED ORDER — OXYCODONE-ACETAMINOPHEN 5-325 MG PO TABS
1.0000 | ORAL_TABLET | ORAL | Status: DC | PRN
  Filled 2011-10-28: qty 2

## 2011-10-28 MED ORDER — BENZOCAINE-MENTHOL 20-0.5 % EX AERO: 1.0000 "application " | INHALATION_SPRAY | CUTANEOUS | Status: DC | PRN

## 2011-10-28 MED ORDER — DIPHENHYDRAMINE HCL 25 MG PO CAPS: 25.0000 mg | ORAL_CAPSULE | Freq: Four times a day (QID) | ORAL | Status: DC | PRN

## 2011-10-28 MED ORDER — FENTANYL 2.5 MCG/ML BUPIVACAINE 1/10 % EPIDURAL INFUSION (WH - ANES)
14.0000 mL/h | INTRAMUSCULAR | Status: DC
  Administered 2011-10-28: 14 mL/h via EPIDURAL
  Filled 2011-10-28 (×2): qty 60

## 2011-10-28 MED ORDER — ONDANSETRON HCL 4 MG/2ML IJ SOLN: 4.0000 mg | INTRAMUSCULAR | Status: DC | PRN

## 2011-10-28 MED ORDER — ZOLPIDEM TARTRATE 5 MG PO TABS: 5.0000 mg | ORAL_TABLET | Freq: Every evening | ORAL | Status: DC | PRN

## 2011-10-28 MED ORDER — EPHEDRINE 5 MG/ML INJ: 10.0000 mg | INTRAVENOUS | Status: DC | PRN

## 2011-10-28 MED ORDER — TETANUS-DIPHTH-ACELL PERTUSSIS 5-2.5-18.5 LF-MCG/0.5 IM SUSP
0.5000 mL | Freq: Once | INTRAMUSCULAR | Status: AC
  Administered 2011-10-29: 0.5 mL via INTRAMUSCULAR
  Filled 2011-10-28: qty 0.5

## 2011-10-28 MED ORDER — SENNOSIDES-DOCUSATE SODIUM 8.6-50 MG PO TABS
2.0000 | ORAL_TABLET | Freq: Every day | ORAL | Status: DC
  Administered 2011-10-28 – 2011-10-29 (×2): 2 via ORAL

## 2011-10-28 MED ORDER — PHENYLEPHRINE 40 MCG/ML (10ML) SYRINGE FOR IV PUSH (FOR BLOOD PRESSURE SUPPORT): 80.0000 ug | PREFILLED_SYRINGE | INTRAVENOUS | Status: DC | PRN

## 2011-10-28 MED ORDER — IBUPROFEN 600 MG PO TABS
600.0000 mg | ORAL_TABLET | Freq: Four times a day (QID) | ORAL | Status: DC
  Administered 2011-10-28 – 2011-10-30 (×9): 600 mg via ORAL
  Filled 2011-10-28 (×5): qty 1

## 2011-10-28 MED ORDER — FERROUS SULFATE 325 (65 FE) MG PO TABS
325.0000 mg | ORAL_TABLET | Freq: Two times a day (BID) | ORAL | Status: DC
  Administered 2011-10-28 – 2011-10-30 (×4): 325 mg via ORAL
  Filled 2011-10-28 (×4): qty 1

## 2011-10-28 NOTE — H&P (Signed)
This is Dr. Francoise Ceo dictating the history and physical on  Shelley Bradley she's a 36 year old gravida 5 para 1012 to at 37 weeks and 2 days her EDC is 420 12:30 GBS unknown and she was admitted with ruptured membranes fluid clear last night cervix 3 cm vertex -2 station and and patient was a little to labor and her own and by 5 AM this morning she was 5 cm 100% an IUPC in a scalp lead or appetite patient also has an epidural Past medical history negative Past surgical history negative Social history noncontributory System review negative Physical exam this is a massively obese female in labor HEENT negative Breasts negative Heart regular rhythm no murmurs no gallops Abdomen pendulous Pelvic as described above Extremities negative and

## 2011-10-28 NOTE — Anesthesia Preprocedure Evaluation (Signed)
Anesthesia Evaluation  Patient identified by MRN, date of birth, ID band Patient awake    Reviewed: Allergy & Precautions, H&P , NPO status , Patient's Chart, lab work & pertinent test results  Airway Mallampati: III TM Distance: >3 FB Neck ROM: full    Dental No notable dental hx.    Pulmonary neg pulmonary ROS,    Pulmonary exam normal       Cardiovascular negative cardio ROS      Neuro/Psych negative neurological ROS  negative psych ROS   GI/Hepatic negative GI ROS, Neg liver ROS,   Endo/Other  Morbid obesity  Renal/GU negative Renal ROS  negative genitourinary   Musculoskeletal negative musculoskeletal ROS (+)   Abdominal (+) + obese,   Peds negative pediatric ROS (+)  Hematology negative hematology ROS (+)   Anesthesia Other Findings   Reproductive/Obstetrics (+) Pregnancy                           Anesthesia Physical Anesthesia Plan  ASA: III  Anesthesia Plan: Epidural   Post-op Pain Management:    Induction:   Airway Management Planned:   Additional Equipment:   Intra-op Plan:   Post-operative Plan:   Informed Consent: I have reviewed the patients History and Physical, chart, labs and discussed the procedure including the risks, benefits and alternatives for the proposed anesthesia with the patient or authorized representative who has indicated his/her understanding and acceptance.     Plan Discussed with:   Anesthesia Plan Comments:         Anesthesia Quick Evaluation  

## 2011-10-28 NOTE — Anesthesia Procedure Notes (Signed)
Epidural Patient location during procedure: OB Start time: 10/28/2011 2:56 AM End time: 10/28/2011 3:01 AM  Staffing Anesthesiologist: Sandrea Hughs Performed by: anesthesiologist   Preanesthetic Checklist Completed: patient identified, site marked, surgical consent, pre-op evaluation, timeout performed, IV checked, risks and benefits discussed and monitors and equipment checked  Epidural Patient position: sitting Prep: site prepped and draped and DuraPrep Patient monitoring: continuous pulse ox and blood pressure Approach: midline Injection technique: LOR air  Needle:  Needle type: Tuohy  Needle gauge: 17 G Needle length: 9 cm Needle insertion depth: 7 cm Catheter type: closed end flexible Catheter size: 19 Gauge Catheter at skin depth: 13 cm Test dose: negative and Other  Assessment Sensory level: T9 Events: blood not aspirated, injection not painful, no injection resistance, negative IV test and no paresthesia

## 2011-10-28 NOTE — Progress Notes (Signed)
[facility] Department of Clinical Social Work Documentation of Interpretation   I assisted __Melissa RN_________________ with interpretation of ______admission________________ for this patient.

## 2011-10-29 LAB — CBC
Hemoglobin: 9.5 g/dL — ABNORMAL LOW (ref 12.0–15.0)
MCHC: 31.9 g/dL (ref 30.0–36.0)
RBC: 3.53 MIL/uL — ABNORMAL LOW (ref 3.87–5.11)
WBC: 7.6 10*3/uL (ref 4.0–10.5)

## 2011-10-29 NOTE — Anesthesia Postprocedure Evaluation (Signed)
  Anesthesia Post-op Note  Patient: Shelley Bradley  Procedure(s) Performed: * No surgery found *  Patient Location: Mother/Baby  Anesthesia Type: Epidural  Level of Consciousness: awake  Airway and Oxygen Therapy: Patient Spontanous Breathing  Post-op Pain: none  Post-op Assessment: Patient's Cardiovascular Status Stable, Respiratory Function Stable, Patent Airway, No signs of Nausea or vomiting, Adequate PO intake, Pain level controlled, No headache, No backache, No residual numbness and No residual motor weakness  Post-op Vital Signs: Reviewed and stable  Complications: No apparent anesthesia complications

## 2011-10-29 NOTE — Progress Notes (Signed)
[facility] Department of Clinical Social Work Documentation of Interpretation   I assisted ___________________ with interpretation of _stopped by to check on patient_____________________ for this patient. 

## 2011-10-29 NOTE — Progress Notes (Signed)
Patient ID: Shelley Bradley, female   DOB: May 07, 1976, 36 y.o.   MRN: [id number] Postpartum day one Vital signs normal Fundus firm Legs negative No complaints and and and and

## 2011-10-29 NOTE — Progress Notes (Signed)
UR chart review completed.  

## 2011-10-30 NOTE — Progress Notes (Signed)
Patient called me down to her room at 10:45 trying to explain something to me, I called the Interpreter and she was able to tell me fell getting out of bed.  Dr Gaynell Face was contacted and he gave me no new orders and to discharge as scheduled.  Patient was in a bit of mild pain in her abdomen, vital signs were normal and no other signs or symptoms of distress was noted.  She first stated the bed was to high and she fell then later the patient stated her leg went to sleep and she stood up and it went out on her.  Her bed was in lower position all morning.  Instructions to call if assistance is needed.  Donzetta Sprung, R.N.

## 2011-10-30 NOTE — Discharge Summary (Signed)
Obstetric Discharge Summary Reason for Admission: onset of labor Prenatal Procedures: none Intrapartum Procedures: spontaneous vaginal delivery Postpartum Procedures: none Complications-Operative and Postpartum: none Hemoglobin  Date Value Range Status  10/29/2011 9.5* 12.0-15.0 (g/dL) Final     HCT  Date Value Range Status  10/29/2011 29.8* 36.0-46.0 (%) Final    Physical Exam:  General: alert Lochia: appropriate Uterine Fundus: firm Incision: healing well DVT Evaluation: No evidence of DVT seen on physical exam.  Discharge Diagnoses: Term Pregnancy-delivered  Discharge Information: Date: 10/30/2011 Activity: pelvic rest Diet: routine Medications: Percocet Condition: stable Instructions: refer to practice specific booklet Discharge to: home Follow-up Information    Follow up with Shelley Rickel A, MD. Call in 6 weeks.   Contact information:   [address] [place] [address] [phone]          Newborn Data: Live born female  Birth Weight: 6 lb 2.1 oz (2781 g) APGAR: 8, 9  Home with mother.  Shelley Bradley Bradley 10/30/2011, 7:16 AM

## 2011-10-30 NOTE — Discharge Instructions (Signed)
Discharge instructions   You can wash your hair  Shower  Eat what you want  Drink what you want  See me in 6 weeks  Your ankles are going to swell more in the next 2 weeks than when pregnant  No sex for 6 weeks   Deric Bocock A, MD 10/30/2011

## 2012-05-30 IMAGING — US US OB FOLLOW-UP
1 series · 12 of 28 positions shown · non-contrast
Comparison: none

[Series 1: us ob follow up · 12 of 43 slices shown]
[im 2/43]
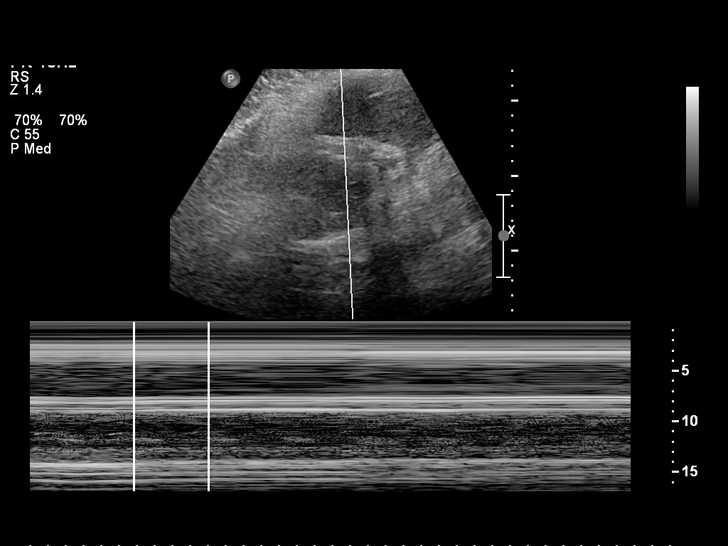
[im 5/43]
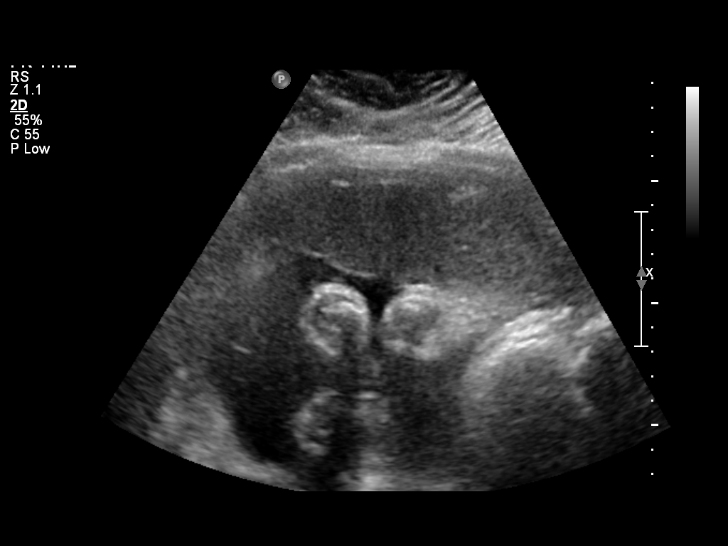
[im 8/43]
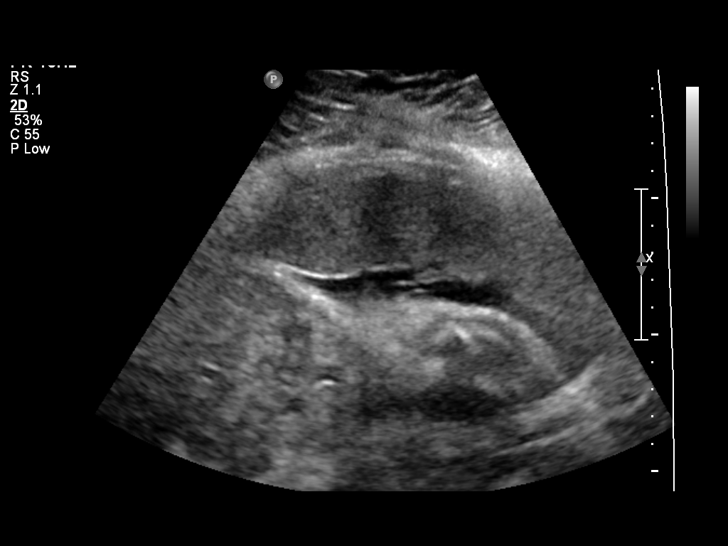
[im 13/43]
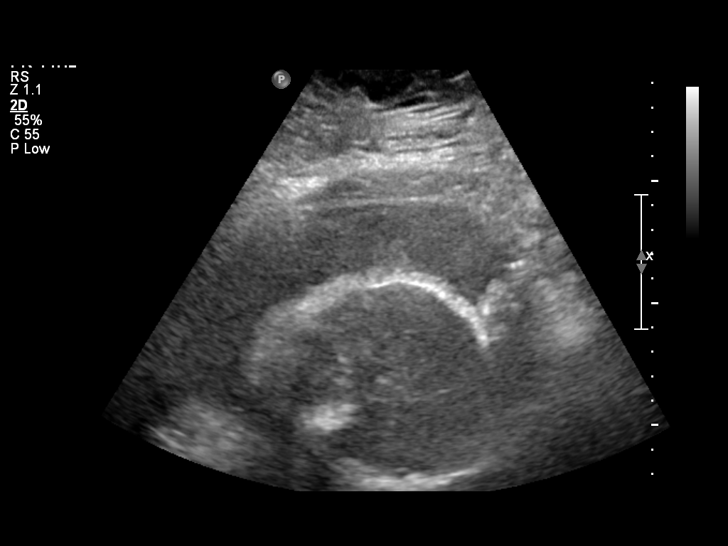
[im 16/43]
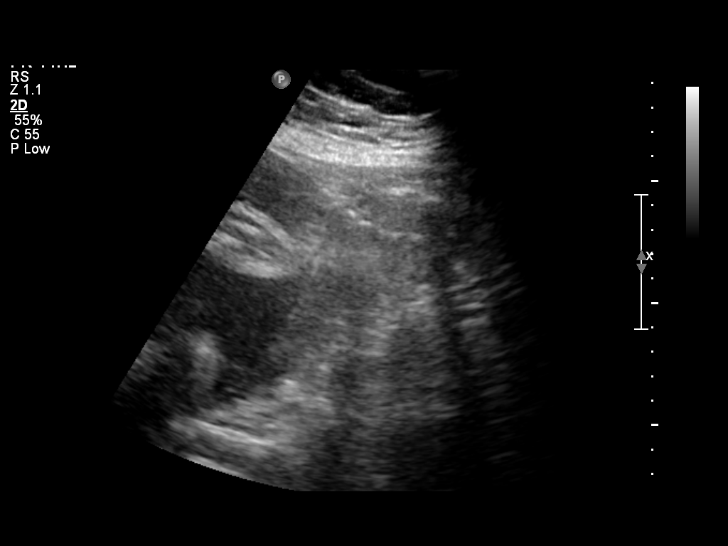
[im 19/43]
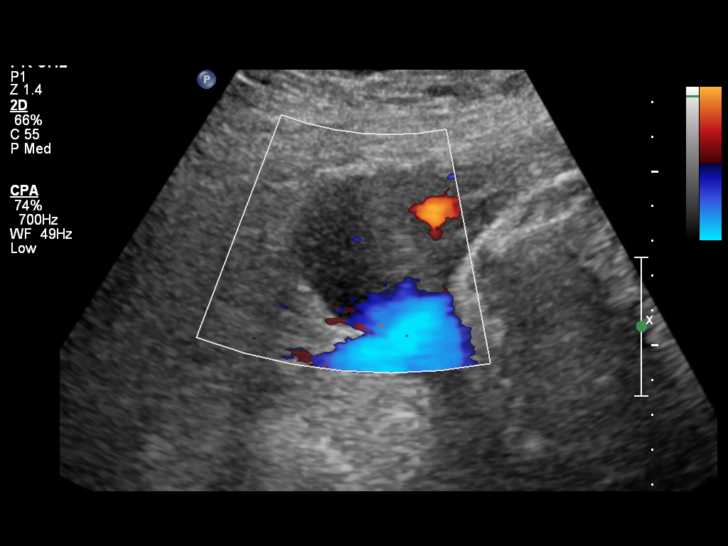
[im 24/43]
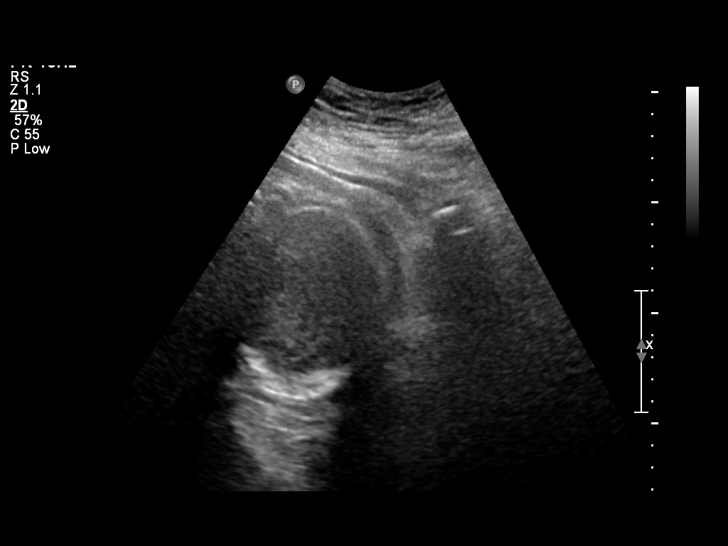
[im 27/43]
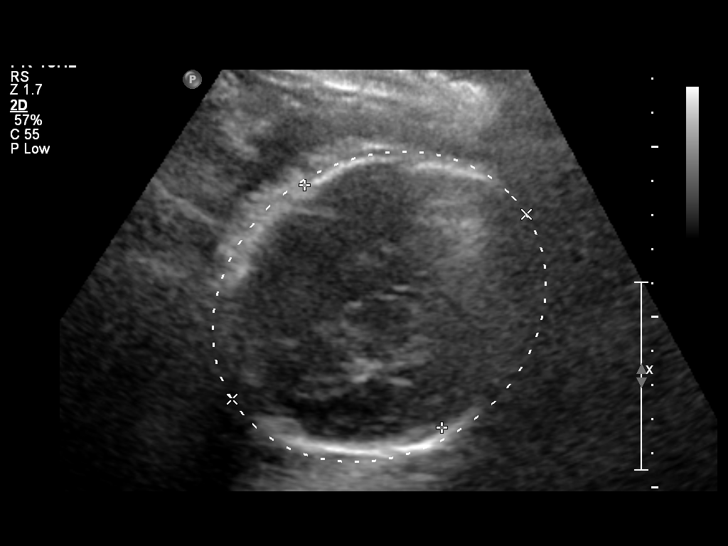
[im 30/43]
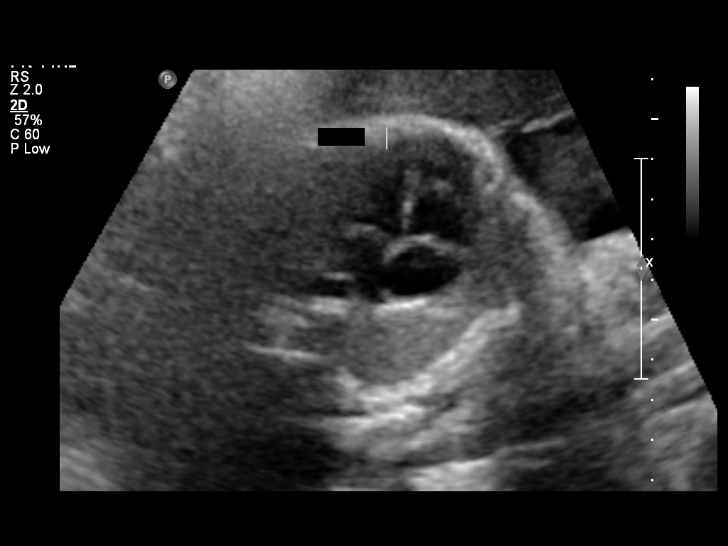
[im 35/43]
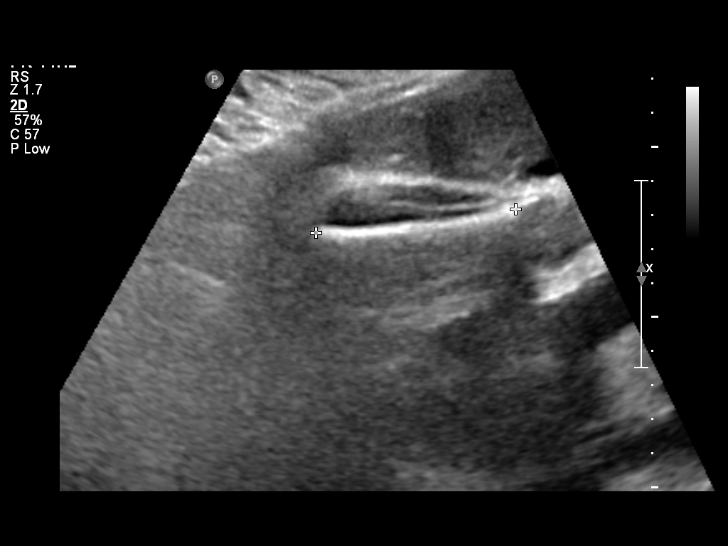
[im 38/43]
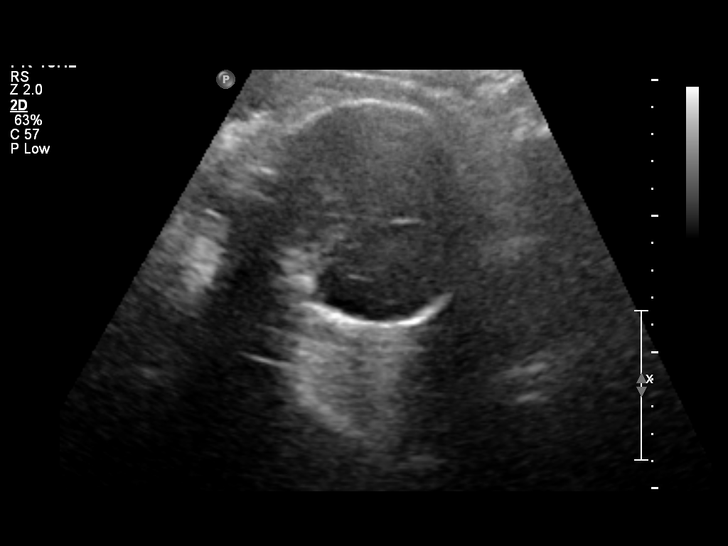
[im 41/43]
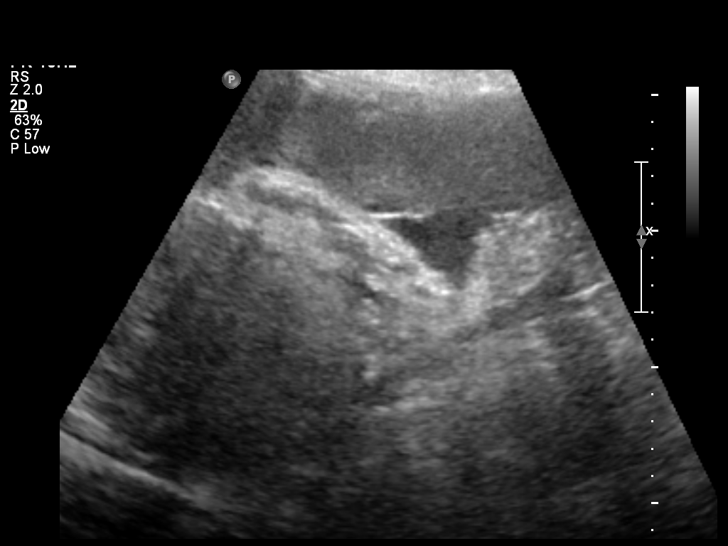

[12 of 28 positions shown; findings below may reference images not displayed]

OBSTETRICS REPORT
                      (Signed Final 09/25/2011 [DATE])

Procedures

 US OB FOLLOW UP                                       76816.1
Indications

 Pain - Abdominal/Pelvic
 Assess Fetal Growth / Estimated Fetal Weight
 Morbid Obesity (336 lbs.)
Fetal Evaluation

 Fetal Heart Rate:  143                         bpm
 Cardiac Activity:  Observed
 Presentation:      Cephalic
 Placenta:          Anterior, above cervical os
 P. Cord            Previously Visualized
 Insertion:

 Comment:    No definite evidence of acute placental abruption.

 Amniotic Fluid
 AFI FV:      Subjectively within normal limits
 AFI Sum:     20.63   cm      78   %Tile     Larg Pckt:   7.49   cm
 RUQ:   7.49   cm    RLQ:    6.82   cm    LUQ:   5.14    cm   LLQ:    1.18   cm
Biometry

 BPD:     81.6  mm    G. Age:   32w 6d                CI:        77.46   70 - 86
                                                      FL/HC:      20.5   19.1 -

 HC:     293.5  mm    G. Age:   32w 3d       15  %    HC/AC:      1.03   0.96 -

 AC:       286  mm    G. Age:   32w 4d       55  %    FL/BPD:     73.7   71 - 87
 FL:      60.1  mm    G. Age:   31w 2d       13  %    FL/AC:      21.0   20 - 24

 Est. FW:    4127  gm      4 lb 4 oz     53  %
Gestational Age

 U/S Today:     32w 2d                                        EDD:   11/17/11
 Best:          32w 3d    Det. By:   Previous Ultrasound      EDD:   11/16/11
Anatomy

 Cranium:           Appears normal      Aortic Arch:       Previously seen
 Fetal Cavum:       Previously seen     Ductal Arch:       Previously seen
 Ventricles:        Previously seen     Diaphragm:         Previously seen
 Choroid Plexus:    Previously seen     Stomach:           Appears
                                                           normal, left
                                                           sided
 Cerebellum:        Previously seen     Abdomen:           Appears normal
 Posterior Fossa:   Previously seen     Abdominal Wall:    Previously seen
 Nuchal Fold:       Not applicable      Cord Vessels:      Previously seen
                    (>20 wks GA)
 Face:              Previously seen     Kidneys:           Appear normal
 Heart:             Appears normal      Bladder:           Appears normal
                    (4 chamber &
                    axis)
 RVOT:              Previously seen     Spine:             Limited views
                                                           previously seen
 LVOT:              Previously seen     Limbs:             Four extremities
                                                           previously seen

 Other:     Heels and Nasal bonepreviously visualized.  Technically
            difficult with very poor resolution due to  maternal habitus.
Cervix Uterus Adnexa

 Cervical Length:   4.2       cm

 Cervix:       Normal appearance by transabdominal scan.
 Uterus:       No abnormality visualized.
 Cul De Sac:   No free fluid seen.
 Left Ovary:   Not visualized.
 Right Ovary:  Within normal limits.

 Adnexa:     No abnormality visualized.
Comments

 This study was performed as an on call procedure and was
 initially reviewed by Dr. Karmen Swaminathan on 09/24/2011.
Impression

 Single intrauterine gestation demonstrating an estimated
 gestational age by ultrasound of 32w 2d. This is correlated
 with expected estimated gestational age by previous
 ultrasound of 32w 3d. EFW is currently at the 53%.

 No late developing fetal anatomic abnormalities are noted
 associated with the four chamber heart, stomach, kidneys or
 bladder. The lateral ventricles were poorly seen today and
 overall resolution was markedly diminished due to patient
 habitus.

 Subjectively and quantitatively normal amniotic fluid volume.

 Normal cervical length and appearance.

 No definite abruption seen given overall poor resolution. No
 placenta is present under the location of the patient's pain.

## 2012-07-27 NOTE — L&D Delivery Note (Signed)
Delivery Note At 1:50 AM a viable female was delivered via  (Presentation: ;  ).  APGAR: , ; weight .   Placenta status: , .  Cord:  with the following complications: .  Cord pH: not done  Anesthesia:   Episiotomy:  Lacerations:  Suture Repair: 2.0 Est. Blood Loss (mL):   Mom to postpartum.  Baby to nursery-stable.  Shelley Bradley A 11/23/2012, 2:05 AM

## 2012-08-04 ENCOUNTER — Encounter (HOSPITAL_COMMUNITY): Payer: Self-pay

## 2012-08-04 ENCOUNTER — Inpatient Hospital Stay (HOSPITAL_COMMUNITY)
Admission: AD | Admit: 2012-08-04 | Discharge: 2012-08-04 | Disposition: A | Discharge status: Home / Self Care | Payer: Self-pay | Source: Ambulatory Visit | Attending: Obstetrics & Gynecology | Admitting: Obstetrics & Gynecology

## 2012-08-04 ENCOUNTER — Inpatient Hospital Stay (HOSPITAL_COMMUNITY): Payer: Self-pay

## 2012-08-04 DIAGNOSIS — O9989 Other specified diseases and conditions complicating pregnancy, childbirth and the puerperium: Secondary | ICD-10-CM

## 2012-08-04 DIAGNOSIS — B3731 Acute candidiasis of vulva and vagina: Secondary | ICD-10-CM | POA: Insufficient documentation

## 2012-08-04 DIAGNOSIS — R300 Dysuria: Secondary | ICD-10-CM | POA: Insufficient documentation

## 2012-08-04 DIAGNOSIS — B373 Candidiasis of vulva and vagina: Secondary | ICD-10-CM | POA: Insufficient documentation

## 2012-08-04 DIAGNOSIS — O99891 Other specified diseases and conditions complicating pregnancy: Secondary | ICD-10-CM | POA: Insufficient documentation

## 2012-08-04 DIAGNOSIS — R109 Unspecified abdominal pain: Secondary | ICD-10-CM | POA: Insufficient documentation

## 2012-08-04 DIAGNOSIS — K219 Gastro-esophageal reflux disease without esophagitis: Secondary | ICD-10-CM | POA: Insufficient documentation

## 2012-08-04 DIAGNOSIS — O2390 Unspecified genitourinary tract infection in pregnancy, unspecified trimester: Secondary | ICD-10-CM | POA: Insufficient documentation

## 2012-08-04 LAB — CBC
HCT: 31.4 % — ABNORMAL LOW (ref 36.0–46.0)
MCH: 28.1 pg (ref 26.0–34.0)
MCV: 84 fL (ref 78.0–100.0)
Platelets: 278 10*3/uL (ref 150–400)
RBC: 3.74 MIL/uL — ABNORMAL LOW (ref 3.87–5.11)
WBC: 7.2 10*3/uL (ref 4.0–10.5)

## 2012-08-04 LAB — DIFFERENTIAL
Eosinophils Absolute: 0.1 10*3/uL (ref 0.0–0.7)
Eosinophils Relative: 1 % (ref 0–5)
Lymphocytes Relative: 28 % (ref 12–46)
Lymphs Abs: 2 10*3/uL (ref 0.7–4.0)
Monocytes Absolute: 0.4 10*3/uL (ref 0.1–1.0)
Monocytes Relative: 6 % (ref 3–12)

## 2012-08-04 LAB — URINE MICROSCOPIC-ADD ON

## 2012-08-04 LAB — URINALYSIS, ROUTINE W REFLEX MICROSCOPIC
Bilirubin Urine: NEGATIVE
Glucose, UA: NEGATIVE mg/dL
Ketones, ur: 15 mg/dL — AB
Leukocytes, UA: NEGATIVE
Nitrite: NEGATIVE
Protein, ur: NEGATIVE mg/dL

## 2012-08-04 LAB — OB RESULTS CONSOLE HIV ANTIBODY (ROUTINE TESTING): HIV: NONREACTIVE

## 2012-08-04 LAB — WET PREP, GENITAL
Clue Cells Wet Prep HPF POC: NONE SEEN
Trich, Wet Prep: NONE SEEN

## 2012-08-04 LAB — RAPID HIV SCREEN (WH-MAU): Rapid HIV Screen: NONREACTIVE

## 2012-08-04 LAB — TYPE AND SCREEN

## 2012-08-04 MED ORDER — GI COCKTAIL ~~LOC~~
30.0000 mL | Freq: Once | ORAL | Status: AC
  Administered 2012-08-04: 30 mL via ORAL
  Filled 2012-08-04: qty 30

## 2012-08-04 MED ORDER — FLUCONAZOLE 150 MG PO TABS: 150.0000 mg | ORAL_TABLET | Freq: Once | ORAL | Status: DC

## 2012-08-04 MED ORDER — PRENATAL VITAMINS (DIS) PO TABS: 1.0000 | ORAL_TABLET | Freq: Once | ORAL | Status: DC

## 2012-08-04 NOTE — MAU Provider Note (Signed)
History     CSN: [id number]  Arrival date and time: 08/04/12 1745   First Provider Initiated Contact with Patient 08/04/12 2037      Chief Complaint  Patient presents with  . Dysuria  . Abdominal Pain  . Vaginal Discharge   HPI  Pt is pregnant with unknown gestational age after having positive urine pregnancy test and +FHT with doppler yesterday at Dr. Elsie Stain office.  Pt had term SVD on April 3,2013 and had LMP 12/09/2011. Pt has had some upper abdominal pain and also some nausea and vomiting this morning.  Pt complains of vaginal pain and white vaginal discharge that burns.  Past Medical History  Diagnosis Date  . No pertinent past medical history   . Obese     Past Surgical History  Procedure Date  . No past surgeries     Family History  Problem Relation Age of Onset  . Anesthesia problems Neg Hx   . Hypotension Neg Hx   . Malignant hyperthermia Neg Hx   . Pseudochol deficiency Neg Hx     History  Substance Use Topics  . Smoking status: Never Smoker   . Smokeless tobacco: Not on file  . Alcohol Use: No    Allergies: No Known Allergies  No prescriptions prior to admission    Review of Systems  Constitutional: Negative for fever and chills.  Gastrointestinal: Positive for nausea, vomiting and abdominal pain.  Genitourinary: Negative for dysuria and urgency.   Physical Exam   Blood pressure 127/70, pulse 94, temperature 98.6 F (37 C), temperature source Oral, resp. rate 20, height 5\' 1"  (1.549 m), weight 350 lb (158.759 kg), last menstrual period 12/09/2011, SpO2 100.00%, unknown if currently breastfeeding.  Physical Exam  Nursing note and vitals reviewed. Constitutional: She is oriented to person, place, and time. She appears well-developed and well-nourished.       massively obese  HENT:  Head: Normocephalic.  Eyes: Pupils are equal, round, and reactive to light.  Neck: Normal range of motion. Neck supple.  Cardiovascular: Normal rate.     Respiratory: Effort normal.  GI: Soft. She exhibits no distension. There is tenderness. There is no rebound and no guarding.       protrubent - superficially tender right mid abdomen; no rebound; FHT audible  Genitourinary:       Vaginal mucosa reddened; cervix high and closed and long- unable to see with speculum due to habitus; uterus and adnexa without palpable enlargement or tenderness- exam complicated by habitus  Musculoskeletal: Normal range of motion.  Neurological: She is alert and oriented to person, place, and time.  Skin: Skin is warm and dry.  Psychiatric: She has a normal mood and affect.    MAU Course  Procedures Results for orders placed during the hospital encounter of 08/04/12 (from the past 24 hour(s))  URINALYSIS, ROUTINE W REFLEX MICROSCOPIC     Status: Abnormal   Collection Time   08/04/12  6:20 PM      Component Value Range   Color, Urine YELLOW  YELLOW   APPearance HAZY (*) CLEAR   Specific Gravity, Urine 1.025  1.005 - 1.030   pH 6.5  5.0 - 8.0   Glucose, UA NEGATIVE  NEGATIVE mg/dL   Hgb urine dipstick TRACE (*) NEGATIVE   Bilirubin Urine NEGATIVE  NEGATIVE   Ketones, ur 15 (*) NEGATIVE mg/dL   Protein, ur NEGATIVE  NEGATIVE mg/dL   Urobilinogen, UA 1.0  0.0 - 1.0 mg/dL   Nitrite NEGATIVE  NEGATIVE   Leukocytes, UA NEGATIVE  NEGATIVE  URINE MICROSCOPIC-ADD ON     Status: Abnormal   Collection Time   08/04/12  6:20 PM      Component Value Range   Squamous Epithelial / LPF FEW (*) RARE   WBC, UA 0-2  <3 WBC/hpf   RBC / HPF 0-2  <3 RBC/hpf   Bacteria, UA FEW (*) RARE  WET PREP, GENITAL     Status: Abnormal   Collection Time   08/04/12  8:46 PM      Component Value Range   Yeast Wet Prep HPF POC NONE SEEN  NONE SEEN   Trich, Wet Prep NONE SEEN  NONE SEEN   Clue Cells Wet Prep HPF POC NONE SEEN  NONE SEEN   WBC, Wet Prep HPF POC MODERATE (*) NONE SEEN  CBC     Status: Abnormal   Collection Time   08/04/12  9:16 PM      Component Value Range   WBC 7.2   4.0 - 10.5 K/uL   RBC 3.74 (*) 3.87 - 5.11 MIL/uL   Hemoglobin 10.5 (*) 12.0 - 15.0 g/dL   HCT 16.1 (*) 09.6 - 04.5 %   MCV 84.0  78.0 - 100.0 fL   MCH 28.1  26.0 - 34.0 pg   MCHC 33.4  30.0 - 36.0 g/dL   RDW 40.9  81.1 - 91.4 %   Platelets 278  150 - 400 K/uL  DIFFERENTIAL     Status: Normal   Collection Time   08/04/12  9:16 PM      Component Value Range   Neutrophils Relative 65  43 - 77 %   Neutro Abs 4.7  1.7 - 7.7 K/uL   Lymphocytes Relative 28  12 - 46 %   Lymphs Abs 2.0  0.7 - 4.0 K/uL   Monocytes Relative 6  3 - 12 %   Monocytes Absolute 0.4  0.1 - 1.0 K/uL   Eosinophils Relative 1  0 - 5 %   Eosinophils Absolute 0.1  0.0 - 0.7 K/uL   Basophils Relative 0  0 - 1 %   Basophils Absolute 0.0  0.0 - 0.1 K/uL  GI cocktail given for upper abdominal pain  Will treat for yeast due to symptoms and clinical evaluation; pt to f/u with OB appointment in clinic on 1/23 Assessment and Plan  Abdominal pain in pregnancy Viable IUP [redacted]w[redacted]d no prenatal care GERD- information given Yeast symptoms- Diflucan 150mg  #2 one tablet now and repeat in 3 days if symptoms persist Mid abdominal pain improved Kathlen Sakurai 08/04/2012, 9:33 PM  Clinical Data: Pelvic pain. Uncertain dates.  LIMITED OBSTETRIC ULTRASOUND  Number of Fetuses: 1  Heart Rate: 155 bpm  Movement: Present  Presentation: Cephalic  Placental Location: Anterior  Previa: None  Amniotic Fluid (Subjective): none  Vertical pocket: 6.8 cmcm  BPD: 5.2cm 21w 5d EDC: 12/10/2012.  MATERNAL FINDINGS:  Cervix: Closed  Uterus/Adnexae: Within normal limits  IMPRESSION:  Single viable intrauterine fetus with cephalic presentation. 21  weeks 5 days.  Recommend followup with non-emergent complete OB 14+ wk US  examination for fetal biometric evaluation and anatomic survey if  not already performed.  Original Report Authenticated By: Davonna Belling, M.D.

## 2012-08-04 NOTE — MAU Note (Signed)
Patient states she went to Dr. Elsie Stain office yesterday and had a positive pregnancy test and heard possible two heart beats. States she has a 84 month old and is breast feeding and has had irregular periods with the last one May 15.Did not know she was pregnant. Has been having Upper and lower abdominal pain, back pain and pain with urination for a while. Has a white vaginal discharge. States Dr. Gaynell Face told her to come here for evaluation.

## 2012-08-05 LAB — ABO/RH: ABO/RH(D): A POS

## 2012-08-05 LAB — HEPATITIS B SURFACE ANTIGEN: Hepatitis B Surface Ag: NEGATIVE

## 2012-08-18 ENCOUNTER — Encounter: Payer: Medicaid Other | Admitting: Obstetrics & Gynecology

## 2012-08-25 ENCOUNTER — Encounter: Payer: Self-pay | Admitting: Family Medicine

## 2012-10-04 ENCOUNTER — Other Ambulatory Visit (HOSPITAL_COMMUNITY): Payer: Self-pay | Admitting: Obstetrics

## 2012-10-06 ENCOUNTER — Ambulatory Visit (HOSPITAL_COMMUNITY): Payer: Self-pay

## 2012-10-07 ENCOUNTER — Encounter (HOSPITAL_COMMUNITY): Payer: Self-pay

## 2012-10-07 ENCOUNTER — Ambulatory Visit (HOSPITAL_COMMUNITY)
Admission: RE | Admit: 2012-10-07 | Discharge: 2012-10-07 | Disposition: A | Discharge status: Home / Self Care | Payer: Self-pay | Source: Ambulatory Visit | Attending: Obstetrics | Admitting: Obstetrics

## 2012-10-07 VITALS — BP 123/72 | HR 106 | Wt 357.2 lb

## 2012-10-07 DIAGNOSIS — Z363 Encounter for antenatal screening for malformations: Secondary | ICD-10-CM | POA: Insufficient documentation

## 2012-10-07 DIAGNOSIS — Z8751 Personal history of pre-term labor: Secondary | ICD-10-CM | POA: Insufficient documentation

## 2012-10-07 DIAGNOSIS — O358XX0 Maternal care for other (suspected) fetal abnormality and damage, not applicable or unspecified: Secondary | ICD-10-CM | POA: Insufficient documentation

## 2012-10-07 DIAGNOSIS — Z1389 Encounter for screening for other disorder: Secondary | ICD-10-CM | POA: Insufficient documentation

## 2012-10-07 DIAGNOSIS — O99210 Obesity complicating pregnancy, unspecified trimester: Secondary | ICD-10-CM | POA: Insufficient documentation

## 2012-10-07 DIAGNOSIS — E669 Obesity, unspecified: Secondary | ICD-10-CM | POA: Insufficient documentation

## 2012-10-07 NOTE — Progress Notes (Signed)
Shelley Bradley  was seen today for an ultrasound appointment.  See full report in [facility].  Impression: Single IUP at 30 6/7 weeks No fetal anomalies identified Somewhat limited views of the spine and cranial anatomy obtained due to advanced gestational age and maternal body habitus Normal amniotic fluid volume  Recommendations: Recommend follow-up ultrasound examination in 4 weeks for interval growth.  Alpha Gula, MD

## 2012-11-01 LAB — OB RESULTS CONSOLE GBS: GBS: NEGATIVE

## 2012-11-04 ENCOUNTER — Encounter (HOSPITAL_COMMUNITY): Payer: Self-pay

## 2012-11-04 ENCOUNTER — Ambulatory Visit (HOSPITAL_COMMUNITY)
Admission: RE | Admit: 2012-11-04 | Discharge: 2012-11-04 | Disposition: A | Discharge status: Home / Self Care | Payer: Self-pay | Source: Ambulatory Visit | Attending: Obstetrics | Admitting: Obstetrics

## 2012-11-04 VITALS — BP 126/78 | HR 108 | Wt 356.8 lb

## 2012-11-04 DIAGNOSIS — E669 Obesity, unspecified: Secondary | ICD-10-CM | POA: Insufficient documentation

## 2012-11-04 DIAGNOSIS — Z363 Encounter for antenatal screening for malformations: Secondary | ICD-10-CM | POA: Insufficient documentation

## 2012-11-04 DIAGNOSIS — O358XX0 Maternal care for other (suspected) fetal abnormality and damage, not applicable or unspecified: Secondary | ICD-10-CM | POA: Insufficient documentation

## 2012-11-04 DIAGNOSIS — Z1389 Encounter for screening for other disorder: Secondary | ICD-10-CM | POA: Insufficient documentation

## 2012-11-04 DIAGNOSIS — O99210 Obesity complicating pregnancy, unspecified trimester: Secondary | ICD-10-CM | POA: Insufficient documentation

## 2012-11-04 NOTE — Progress Notes (Signed)
Nanci Lopez-Balanzar  was seen today for an ultrasound appointment.  See full report in [facility].  Impression: Single IUP at 34 6/7 weeks Interval growth is appropriate (68th %tile) Somewhal limited views of cranium obtained due to fetal position and maternal body habitus Normal amniotic fluid volume  Recommendations: Follow-up ultrasounds as clinically indicated.   Alpha Gula, MD

## 2012-11-14 ENCOUNTER — Inpatient Hospital Stay (HOSPITAL_COMMUNITY)
Admission: AD | Admit: 2012-11-14 | Discharge: 2012-11-14 | Disposition: A | Discharge status: Home / Self Care | Payer: Self-pay | Source: Ambulatory Visit | Attending: Obstetrics | Admitting: Obstetrics

## 2012-11-14 ENCOUNTER — Encounter (HOSPITAL_COMMUNITY): Payer: Self-pay | Admitting: *Deleted

## 2012-11-14 DIAGNOSIS — O479 False labor, unspecified: Secondary | ICD-10-CM | POA: Insufficient documentation

## 2012-11-14 NOTE — MAU Note (Signed)
Per interpreter pt complains of contractions, denies bleeding or ROM.

## 2012-11-22 ENCOUNTER — Encounter (HOSPITAL_COMMUNITY): Payer: Self-pay | Admitting: Anesthesiology

## 2012-11-22 ENCOUNTER — Encounter (HOSPITAL_COMMUNITY): Payer: Self-pay | Admitting: *Deleted

## 2012-11-22 ENCOUNTER — Inpatient Hospital Stay (HOSPITAL_COMMUNITY)
Admission: AD | Admit: 2012-11-22 | Discharge: 2012-11-25 | DRG: 775 | Disposition: A | Discharge status: Home / Self Care | Payer: Medicaid Other | Source: Ambulatory Visit | Attending: Obstetrics | Admitting: Obstetrics

## 2012-11-22 DIAGNOSIS — E669 Obesity, unspecified: Principal | ICD-10-CM | POA: Diagnosis present

## 2012-11-22 LAB — CBC
MCV: 81.4 fL (ref 78.0–100.0)
Platelets: 258 10*3/uL (ref 150–400)
RBC: 4.03 MIL/uL (ref 3.87–5.11)
RDW: 14.9 % (ref 11.5–15.5)
WBC: 6.3 10*3/uL (ref 4.0–10.5)

## 2012-11-22 LAB — OB RESULTS CONSOLE RPR: RPR: NONREACTIVE

## 2012-11-22 LAB — OB RESULTS CONSOLE HIV ANTIBODY (ROUTINE TESTING): HIV: NONREACTIVE

## 2012-11-22 LAB — OB RESULTS CONSOLE GC/CHLAMYDIA: Gonorrhea: NEGATIVE

## 2012-11-22 MED ORDER — OXYCODONE-ACETAMINOPHEN 5-325 MG PO TABS: 1.0000 | ORAL_TABLET | ORAL | Status: DC | PRN

## 2012-11-22 MED ORDER — IBUPROFEN 600 MG PO TABS
600.0000 mg | ORAL_TABLET | Freq: Four times a day (QID) | ORAL | Status: DC | PRN
  Administered 2012-11-23 – 2012-11-24 (×2): 600 mg via ORAL
  Filled 2012-11-22 (×10): qty 1

## 2012-11-22 MED ORDER — OXYTOCIN 40 UNITS IN LACTATED RINGERS INFUSION - SIMPLE MED
62.5000 mL/h | INTRAVENOUS | Status: DC
  Filled 2012-11-22: qty 1000

## 2012-11-22 MED ORDER — BUTORPHANOL TARTRATE 1 MG/ML IJ SOLN
1.0000 mg | INTRAMUSCULAR | Status: DC | PRN
  Administered 2012-11-22: 1 mg via INTRAVENOUS
  Filled 2012-11-22 (×3): qty 1

## 2012-11-22 MED ORDER — TERBUTALINE SULFATE 1 MG/ML IJ SOLN: 0.2500 mg | Freq: Once | INTRAMUSCULAR | Status: AC | PRN

## 2012-11-22 MED ORDER — OXYTOCIN 40 UNITS IN LACTATED RINGERS INFUSION - SIMPLE MED
1.0000 m[IU]/min | INTRAVENOUS | Status: DC
  Administered 2012-11-22: 2 m[IU]/min via INTRAVENOUS

## 2012-11-22 MED ORDER — LIDOCAINE HCL (PF) 1 % IJ SOLN
30.0000 mL | INTRAMUSCULAR | Status: DC | PRN
  Filled 2012-11-22 (×2): qty 30

## 2012-11-22 MED ORDER — ACETAMINOPHEN 325 MG PO TABS: 650.0000 mg | ORAL_TABLET | ORAL | Status: DC | PRN

## 2012-11-22 MED ORDER — OXYTOCIN BOLUS FROM INFUSION: 500.0000 mL | INTRAVENOUS | Status: DC

## 2012-11-22 MED ORDER — FLEET ENEMA 7-19 GM/118ML RE ENEM: 1.0000 | ENEMA | RECTAL | Status: DC | PRN

## 2012-11-22 MED ORDER — LACTATED RINGERS IV SOLN: 500.0000 mL | INTRAVENOUS | Status: DC | PRN

## 2012-11-22 MED ORDER — CITRIC ACID-SODIUM CITRATE 334-500 MG/5ML PO SOLN: 30.0000 mL | ORAL | Status: DC | PRN

## 2012-11-22 MED ORDER — ONDANSETRON HCL 4 MG/2ML IJ SOLN: 4.0000 mg | Freq: Four times a day (QID) | INTRAMUSCULAR | Status: DC | PRN

## 2012-11-22 MED ORDER — LACTATED RINGERS IV SOLN: INTRAVENOUS | Status: DC

## 2012-11-22 NOTE — MAU Note (Signed)
Pt states she started having u/c's this morning about 0800.  Denies any vaginal bleeding or ROM.  Good fetal movement.

## 2012-11-22 NOTE — MAU Note (Signed)
Pt states she has had vaginal wetness since this morning about 0830 and requiring her to change x 2 today.

## 2012-11-23 ENCOUNTER — Inpatient Hospital Stay (HOSPITAL_COMMUNITY): Payer: Medicaid Other | Admitting: Anesthesiology

## 2012-11-23 ENCOUNTER — Encounter (HOSPITAL_COMMUNITY): Payer: Self-pay | Admitting: Anesthesiology

## 2012-11-23 MED ORDER — PHENYLEPHRINE 40 MCG/ML (10ML) SYRINGE FOR IV PUSH (FOR BLOOD PRESSURE SUPPORT)
80.0000 ug | PREFILLED_SYRINGE | INTRAVENOUS | Status: DC | PRN
  Filled 2012-11-23: qty 2
  Filled 2012-11-23: qty 5

## 2012-11-23 MED ORDER — FENTANYL 2.5 MCG/ML BUPIVACAINE 1/10 % EPIDURAL INFUSION (WH - ANES)
INTRAMUSCULAR | Status: DC | PRN
  Administered 2012-11-23: 12 mL/h via EPIDURAL

## 2012-11-23 MED ORDER — LACTATED RINGERS IV SOLN
500.0000 mL | Freq: Once | INTRAVENOUS | Status: AC
  Administered 2012-11-23: 500 mL via INTRAVENOUS

## 2012-11-23 MED ORDER — SIMETHICONE 80 MG PO CHEW: 80.0000 mg | CHEWABLE_TABLET | ORAL | Status: DC | PRN

## 2012-11-23 MED ORDER — EPHEDRINE 5 MG/ML INJ
10.0000 mg | INTRAVENOUS | Status: DC | PRN
  Filled 2012-11-23: qty 2
  Filled 2012-11-23: qty 4

## 2012-11-23 MED ORDER — WITCH HAZEL-GLYCERIN EX PADS: 1.0000 "application " | MEDICATED_PAD | CUTANEOUS | Status: DC | PRN

## 2012-11-23 MED ORDER — FENTANYL 2.5 MCG/ML BUPIVACAINE 1/10 % EPIDURAL INFUSION (WH - ANES)
14.0000 mL/h | INTRAMUSCULAR | Status: DC | PRN
  Filled 2012-11-23: qty 125

## 2012-11-23 MED ORDER — ONDANSETRON HCL 4 MG/2ML IJ SOLN: 4.0000 mg | INTRAMUSCULAR | Status: DC | PRN

## 2012-11-23 MED ORDER — ONDANSETRON HCL 4 MG PO TABS: 4.0000 mg | ORAL_TABLET | ORAL | Status: DC | PRN

## 2012-11-23 MED ORDER — TETANUS-DIPHTH-ACELL PERTUSSIS 5-2.5-18.5 LF-MCG/0.5 IM SUSP
0.5000 mL | Freq: Once | INTRAMUSCULAR | Status: AC
  Administered 2012-11-23: 0.5 mL via INTRAMUSCULAR

## 2012-11-23 MED ORDER — DIPHENHYDRAMINE HCL 50 MG/ML IJ SOLN: 12.5000 mg | INTRAMUSCULAR | Status: DC | PRN

## 2012-11-23 MED ORDER — LIDOCAINE HCL (PF) 1 % IJ SOLN
INTRAMUSCULAR | Status: DC | PRN
  Administered 2012-11-23: 3 mL
  Administered 2012-11-23: 4 mL

## 2012-11-23 MED ORDER — BENZOCAINE-MENTHOL 20-0.5 % EX AERO: 1.0000 "application " | INHALATION_SPRAY | CUTANEOUS | Status: DC | PRN

## 2012-11-23 MED ORDER — PHENYLEPHRINE 40 MCG/ML (10ML) SYRINGE FOR IV PUSH (FOR BLOOD PRESSURE SUPPORT)
80.0000 ug | PREFILLED_SYRINGE | INTRAVENOUS | Status: DC | PRN
  Filled 2012-11-23: qty 2

## 2012-11-23 MED ORDER — DIPHENHYDRAMINE HCL 25 MG PO CAPS: 25.0000 mg | ORAL_CAPSULE | Freq: Four times a day (QID) | ORAL | Status: DC | PRN

## 2012-11-23 MED ORDER — IBUPROFEN 600 MG PO TABS
600.0000 mg | ORAL_TABLET | Freq: Four times a day (QID) | ORAL | Status: DC
  Administered 2012-11-23 – 2012-11-25 (×8): 600 mg via ORAL
  Filled 2012-11-23: qty 1

## 2012-11-23 MED ORDER — SENNOSIDES-DOCUSATE SODIUM 8.6-50 MG PO TABS
2.0000 | ORAL_TABLET | Freq: Every day | ORAL | Status: DC
  Administered 2012-11-23 – 2012-11-24 (×2): 2 via ORAL

## 2012-11-23 MED ORDER — ZOLPIDEM TARTRATE 5 MG PO TABS: 5.0000 mg | ORAL_TABLET | Freq: Every evening | ORAL | Status: DC | PRN

## 2012-11-23 MED ORDER — DIBUCAINE 1 % RE OINT: 1.0000 "application " | TOPICAL_OINTMENT | RECTAL | Status: DC | PRN

## 2012-11-23 MED ORDER — LANOLIN HYDROUS EX OINT: TOPICAL_OINTMENT | CUTANEOUS | Status: DC | PRN

## 2012-11-23 MED ORDER — OXYCODONE-ACETAMINOPHEN 5-325 MG PO TABS
1.0000 | ORAL_TABLET | ORAL | Status: DC | PRN
  Administered 2012-11-23 – 2012-11-24 (×5): 2 via ORAL
  Filled 2012-11-23 (×5): qty 2

## 2012-11-23 MED ORDER — FERROUS SULFATE 325 (65 FE) MG PO TABS
325.0000 mg | ORAL_TABLET | Freq: Two times a day (BID) | ORAL | Status: DC
  Administered 2012-11-23 – 2012-11-25 (×5): 325 mg via ORAL
  Filled 2012-11-23 (×6): qty 1

## 2012-11-23 MED ORDER — EPHEDRINE 5 MG/ML INJ
10.0000 mg | INTRAVENOUS | Status: DC | PRN
  Filled 2012-11-23: qty 2

## 2012-11-23 MED ORDER — PRENATAL MULTIVITAMIN CH
1.0000 | ORAL_TABLET | Freq: Every day | ORAL | Status: DC
  Administered 2012-11-23 – 2012-11-25 (×3): 1 via ORAL
  Filled 2012-11-23 (×3): qty 1

## 2012-11-23 NOTE — H&P (Signed)
This is Dr. Francoise Ceo dictating the history and physical on blank blank she's a 37 year old gravida 6 para 3002 3 negative GBS at 37 weeks and 4 days EDC 12/11/2018 membranes ruptured spontaneously at 8 PM on 11/22/2012 and the fluid was clear she came in on the night of 11/22/2012 in labor cervix 4 cm vertex -290% effaced progressed satisfactorily had a normal vaginal delivery of a female Apgar 8 and 9 placenta spontaneous no episiotomy or laceration Past medical history negative Past surgical history negative Social history negative System review noncontributory Physical exam massively obese female in no distress HEENT negative Rest negative Lungs clear to P&A Heart regular rhythm no murmurs no gallops Abdomen massively obese Pelvic as described above Extremities negative and

## 2012-11-23 NOTE — Progress Notes (Signed)
UR chart review completed.  

## 2012-11-23 NOTE — Anesthesia Postprocedure Evaluation (Signed)
Anesthesia Post Note  Patient: Shelley Bradley  Procedure(s) Performed: * No procedures listed *  Anesthesia type: Epidural  Patient location: Mother/Baby  Post pain: Pain level controlled  Post assessment: Post-op Vital signs reviewed  Last Vitals:  Filed Vitals:   11/23/12 0430  BP: 128/77  Pulse: 93  Temp: 37.4 C  Resp: 20    Post vital signs: Reviewed  Level of consciousness: awake  Complications: No apparent anesthesia complications

## 2012-11-23 NOTE — Anesthesia Preprocedure Evaluation (Signed)
Anesthesia Evaluation  Patient identified by MRN, date of birth, ID band Patient awake    Reviewed: Allergy & Precautions, H&P , Patient's Chart, lab work & pertinent test results  Airway Mallampati: III TM Distance: >3 FB Neck ROM: full    Dental no notable dental hx. (+) Teeth Intact   Pulmonary asthma ,  breath sounds clear to auscultation  Pulmonary exam normal       Cardiovascular negative cardio ROS  Rhythm:regular Rate:Normal     Neuro/Psych negative neurological ROS  negative psych ROS   GI/Hepatic negative GI ROS, Neg liver ROS,   Endo/Other  Morbid obesitySuper MO  Renal/GU negative Renal ROS  negative genitourinary   Musculoskeletal   Abdominal Normal abdominal exam  (+)   Peds  Hematology negative hematology ROS (+)   Anesthesia Other Findings   Reproductive/Obstetrics (+) Pregnancy                           Anesthesia Physical Anesthesia Plan  ASA: III  Anesthesia Plan: Epidural   Post-op Pain Management:    Induction:   Airway Management Planned:   Additional Equipment:   Intra-op Plan:   Post-operative Plan:   Informed Consent: I have reviewed the patients History and Physical, chart, labs and discussed the procedure including the risks, benefits and alternatives for the proposed anesthesia with the patient or authorized representative who has indicated his/her understanding and acceptance.     Plan Discussed with: Anesthesiologist  Anesthesia Plan Comments:         Anesthesia Quick Evaluation

## 2012-11-23 NOTE — Progress Notes (Signed)
Patient ID: Shelley Bradley, female   DOB: April 06, 1976, 37 y.o.   MRN: [id number] And postpartum day 0 Vital signs normal Fundus firm Lochia moderate Doing well and and

## 2012-11-23 NOTE — Anesthesia Procedure Notes (Signed)
Epidural Patient location during procedure: OB Start time: 11/23/2012 1:31 AM  Staffing Anesthesiologist: Amy Belloso A. Performed by: anesthesiologist   Preanesthetic Checklist Completed: patient identified, site marked, surgical consent, pre-op evaluation, timeout performed, IV checked, risks and benefits discussed and monitors and equipment checked  Epidural Patient position: sitting Prep: site prepped and draped and DuraPrep Patient monitoring: continuous pulse ox and blood pressure Approach: midline Injection technique: LOR air  Needle:  Needle type: Tuohy  Needle gauge: 17 G Needle length: 9 cm and 9 Needle insertion depth: 9 cm Catheter type: closed end flexible Catheter size: 19 Gauge Catheter at skin depth: 15 cm Test dose: negative and Other  Assessment Events: blood not aspirated, injection not painful, no injection resistance, negative IV test and no paresthesia  Additional Notes Patient identified. Risks and benefits discussed including failed block, incomplete  Pain control, post dural puncture headache, nerve damage, paralysis, blood pressure Changes, nausea, vomiting, reactions to medications-both toxic and allergic and post Partum back pain. All questions were answered. Patient expressed understanding and wished to proceed. Sterile technique was used throughout procedure. Epidural site was Dressed with sterile barrier dressing. No paresthesias, signs of intravascular injection Or signs of intrathecal spread were encountered.  Patient was more comfortable after the epidural was dosed. Please see RN's note for documentation of vital signs and FHR which are stable.

## 2012-11-24 LAB — CBC
MCV: 81.5 fL (ref 78.0–100.0)
Platelets: 213 10*3/uL (ref 150–400)
RBC: 3.89 MIL/uL (ref 3.87–5.11)
WBC: 5.7 10*3/uL (ref 4.0–10.5)

## 2012-11-24 NOTE — Progress Notes (Signed)
Patient ID: Memory Dance, female   DOB: Nov 19, 1975, 37 y.o.   MRN: [id number] Postpartum day one Vital signs normal Fundus firm No complaints doing well

## 2012-11-25 NOTE — Discharge Summary (Signed)
Obstetric Discharge Summary Reason for Admission: onset of labor Prenatal Procedures: none Intrapartum Procedures: spontaneous vaginal delivery Postpartum Procedures: none Complications-Operative and Postpartum: none Hemoglobin  Date Value Range Status  11/24/2012 9.9* 12.0 - 15.0 g/dL Final     HCT  Date Value Range Status  11/24/2012 31.7* 36.0 - 46.0 % Final    Physical Exam:  General: alert Lochia: appropriate Uterine Fundus: firm Incision: healing well DVT Evaluation: No evidence of DVT seen on physical exam.  Discharge Diagnoses: Term Pregnancy-delivered  Discharge Information: Date: 11/25/2012 Activity: unrestricted Diet: routine Medications: Percocet Condition: stable Instructions: refer to practice specific booklet Discharge to: home Follow-up Information   Follow up with Marquavis Hannen A, MD. Schedule an appointment as soon as possible for a visit in 6 weeks.   Contact information:   [address] [place] [address] [phone]       Newborn Data: Live born female  Birth Weight: 6 lb 8 oz (2948 g) APGAR: 9, 9  Home with mother.  Lasheba Stevens A 11/25/2012, 6:33 AM

## 2013-12-26 ENCOUNTER — Emergency Department (HOSPITAL_COMMUNITY): Payer: Medicaid Other

## 2013-12-26 ENCOUNTER — Encounter (HOSPITAL_COMMUNITY): Payer: Self-pay | Admitting: Emergency Medicine

## 2013-12-26 ENCOUNTER — Emergency Department (HOSPITAL_COMMUNITY)
Admission: EM | Admit: 2013-12-26 | Discharge: 2013-12-26 | Disposition: A | Discharge status: Home / Self Care | Payer: Medicaid Other | Attending: Emergency Medicine | Admitting: Emergency Medicine

## 2013-12-26 DIAGNOSIS — K8050 Calculus of bile duct without cholangitis or cholecystitis without obstruction: Secondary | ICD-10-CM

## 2013-12-26 DIAGNOSIS — E669 Obesity, unspecified: Secondary | ICD-10-CM | POA: Insufficient documentation

## 2013-12-26 DIAGNOSIS — K8020 Calculus of gallbladder without cholecystitis without obstruction: Secondary | ICD-10-CM | POA: Insufficient documentation

## 2013-12-26 DIAGNOSIS — N390 Urinary tract infection, site not specified: Secondary | ICD-10-CM | POA: Insufficient documentation

## 2013-12-26 DIAGNOSIS — Z3202 Encounter for pregnancy test, result negative: Secondary | ICD-10-CM | POA: Insufficient documentation

## 2013-12-26 LAB — COMPREHENSIVE METABOLIC PANEL
ALK PHOS: 68 U/L (ref 39–117)
ALT: 19 U/L (ref 0–35)
AST: 21 U/L (ref 0–37)
Albumin: 3.5 g/dL (ref 3.5–5.2)
BUN: 9 mg/dL (ref 6–23)
CALCIUM: 9.3 mg/dL (ref 8.4–10.5)
CO2: 29 meq/L (ref 19–32)
Chloride: 101 mEq/L (ref 96–112)
Creatinine, Ser: 0.53 mg/dL (ref 0.50–1.10)
GFR calc Af Amer: >90 mL/min (ref >90)
Glucose, Bld: 89 mg/dL (ref 70–99)
POTASSIUM: 4 meq/L (ref 3.7–5.3)
SODIUM: 139 meq/L (ref 137–147)
TOTAL PROTEIN: 7.7 g/dL (ref 6.0–8.3)
Total Bilirubin: <0.2 mg/dL — ABNORMAL LOW (ref 0.3–1.2)

## 2013-12-26 LAB — URINALYSIS, ROUTINE W REFLEX MICROSCOPIC
BILIRUBIN URINE: NEGATIVE
GLUCOSE, UA: NEGATIVE mg/dL
KETONES UR: NEGATIVE mg/dL
Nitrite: POSITIVE — AB
PH: 7.5 (ref 5.0–8.0)
Protein, ur: NEGATIVE mg/dL
Specific Gravity, Urine: 1.014 (ref 1.005–1.030)
Urobilinogen, UA: 0.2 mg/dL (ref 0.0–1.0)

## 2013-12-26 LAB — CBC WITH DIFFERENTIAL/PLATELET
Basophils Absolute: 0 10*3/uL (ref 0.0–0.1)
Basophils Relative: 0 % (ref 0–1)
EOS ABS: 0.1 10*3/uL (ref 0.0–0.7)
EOS PCT: 2 % (ref 0–5)
HCT: 35.4 % — ABNORMAL LOW (ref 36.0–46.0)
HEMOGLOBIN: 11.2 g/dL — AB (ref 12.0–15.0)
LYMPHS ABS: 2.2 10*3/uL (ref 0.7–4.0)
Lymphocytes Relative: 34 % (ref 12–46)
MCH: 25.7 pg — AB (ref 26.0–34.0)
MCHC: 31.6 g/dL (ref 30.0–36.0)
MCV: 81.2 fL (ref 78.0–100.0)
MONOS PCT: 6 % (ref 3–12)
Monocytes Absolute: 0.4 10*3/uL (ref 0.1–1.0)
Neutro Abs: 3.8 10*3/uL (ref 1.7–7.7)
Neutrophils Relative %: 58 % (ref 43–77)
Platelets: 290 10*3/uL (ref 150–400)
RBC: 4.36 MIL/uL (ref 3.87–5.11)
RDW: 15 % (ref 11.5–15.5)
WBC: 6.5 10*3/uL (ref 4.0–10.5)

## 2013-12-26 LAB — LIPASE, BLOOD: Lipase: 29 U/L (ref 11–59)

## 2013-12-26 LAB — URINE MICROSCOPIC-ADD ON

## 2013-12-26 LAB — POC URINE PREG, ED: Preg Test, Ur: NEGATIVE

## 2013-12-26 MED ORDER — HYDROCODONE-ACETAMINOPHEN 5-325 MG PO TABS: 1.0000 | ORAL_TABLET | Freq: Four times a day (QID) | ORAL | Status: DC | PRN

## 2013-12-26 MED ORDER — GI COCKTAIL ~~LOC~~
30.0000 mL | Freq: Once | ORAL | Status: AC
  Administered 2013-12-26: 30 mL via ORAL
  Filled 2013-12-26: qty 30

## 2013-12-26 MED ORDER — DEXTROSE 5 % IV SOLN
1.0000 g | Freq: Once | INTRAVENOUS | Status: AC
  Administered 2013-12-26: 1 g via INTRAVENOUS
  Filled 2013-12-26: qty 10

## 2013-12-26 MED ORDER — PANTOPRAZOLE SODIUM 20 MG PO TBEC: 20.0000 mg | DELAYED_RELEASE_TABLET | Freq: Every day | ORAL | Status: DC

## 2013-12-26 MED ORDER — CEPHALEXIN 500 MG PO CAPS: 500.0000 mg | ORAL_CAPSULE | Freq: Four times a day (QID) | ORAL | Status: DC

## 2013-12-26 MED ORDER — MORPHINE SULFATE 4 MG/ML IJ SOLN
8.0000 mg | Freq: Once | INTRAMUSCULAR | Status: AC
  Administered 2013-12-26: 8 mg via INTRAVENOUS
  Filled 2013-12-26: qty 2

## 2013-12-26 MED ORDER — ONDANSETRON HCL 4 MG/2ML IJ SOLN
4.0000 mg | Freq: Once | INTRAMUSCULAR | Status: AC
  Administered 2013-12-26: 4 mg via INTRAVENOUS
  Filled 2013-12-26: qty 2

## 2013-12-26 NOTE — ED Notes (Signed)
Per EMS- Patient reported that her epigastric and mid abdominal pain began at 0830 after drinking coffee. Patient also c/o N/v x 3 today. Patient denies any diarrhea.

## 2013-12-26 NOTE — ED Notes (Signed)
PLAN OF CARE AND MEDICATION REVIEW WITH PT AND FAMILY VIA INTERPRETER [phone]

## 2013-12-26 NOTE — ED Notes (Signed)
Bed: WA22 Expected date:  Expected time:  Means of arrival:  Comments: EMS-abdominal pain 

## 2013-12-26 NOTE — ED Notes (Signed)
Charge nurse made aware of situation where patient had hands under the covers and ice pack not on.

## 2013-12-26 NOTE — Progress Notes (Signed)
[facility] spoke with patient via interpretor phone and provided her with a [facility] Orange Card application to help pt establish primary care.

## 2013-12-26 NOTE — Discharge Instructions (Signed)
Cólico biliar  (Biliary Colic)   El cólico biliar es un dolor continuo o irregular en la zona superior del abdomen. Generalmente se ubica debajo de la zona derecha de la caja torácica. Aparece cuando los cálculos biliares interfieren con el flujo normal de la bilis que proviene de la vesícula. La bilis es un líquido que interviene en la digestión de las grasas. Se produce en el hígado y se almacena en la vesícula. Al comer, La bilis pasa desde la vesícula, a través del conducto cístico y el conducto biliar común al intestino delgado. Allí se mezcla con la comida parcialmente digerida. Si un cálculo obstruye alguno de esos conductos, se detiene el flujo normal de bilis. Las células del conducto biliar se contraen con fuerza para mover el cálculo. Esto causa el dolor del cólico biliar.   SÍNTOMAS  · El paciente se queja de dolor en la zona superior del abdomen. El dolor puede ser:  · En el centro de la zona superior del abdomen, justo por debajo del esternón.  · En la zona superior derecha del abdomen, donde se encuentra la vesícula biliar y el hígado.  · Se expande hacia la espalda, hacia el omóplato derecho.  · Náuseas y vómitos  · El dolor comienza generalmente después de comer.  · El cólico biliar aparece como una demanda de bilis por parte del sistema digestivo. La demanda de bilis es mayor luego de ingerir alimentos ricos en grasas. Los síntomas también pueden aparecer en personas que han estado ayunando e ingieren abruptamente una comida abundante. La mayoría de los episodios de cólico biliar mejoran luego de 1 a 5 horas. Después que se alivia el dolor más intenso, podrá seguir sintiendo un dolor moderado en el abdomen durante un lapso de 24 horas.  DIAGNÓSTICO  Luego de escuchar la descripción de los síntomas, el médico realizará un examen físico. Deberá prestar atención a la zona superior del abdomen. Esta es la zona en la que se encuentra el hígado y la vesícula biliar. El médico podrá observar los cálculos  a través de una ecografía. También le realizaran un escaneo especializado de la vesícula biliar. Le indicarán análisis de sangre, especialmente si tiene fiebre o el dolor persiste.  PREVENCIÓN  El cólico biliar puede evitarse controlando los factores de riesgo que favorecen los cálculos. Algunos de estos factores de riesgo como la herencia, el aumento de la edad y el embarazo son aspectos normales de la vida. La obesidad y una dieta rica en grasas son factores de riesgo que usted puede modificar a través de cambios hacia un estilo de vida saludable. Las mujeres que atraviesan la menopausia y que reciben terapia de reemplazo hormonal (estrógenos) también tienen más riesgo de desarrollar cólicos biliares.  TRATAMIENTO  · Le prescribirán analgésicos.  · Le indicarán una dieta sin grasas.  · Si el primer episodio es intenso, o si los cólicos aparecen nuevamente, generalmente se indica la cirugía para extirpar la vesícula (colecistectomía). Este procedimiento puede realizarse a través de pequeñas incisiones utilizando un instrumento denominado laparoscopio. Muchas veces se requiere una breve estadía en el hospital. Algunas personas reciben el alta el mismo día. Es el tratamiento más ampliamente utilizado en personas que sufren dolor por cálculos biliares. Es efectivo y seguro, no tiene complicaciones en más del 90% de los casos.  · Si la cirugía no puede llevarse a cabo, podrán utilizarse medicamentos para disolver los cálculos. Esta medicación es cara y puede demorar meses o años hasta que tenga efecto. Sólo podrá   de choque. Este procedimiento [place] ondas de choque cuidadosamente dirigidas a romper los clculos. En muchas personas tratadas con este procedimiento, los clculos vuelven a formarse luego de [employer]. PRONSTICO Si los clculos obstruyen el conducto cstico o  conducto biliar comn, usted tiene el riesgo de sufrir episodios repetidos de clicos biliares. Tambin existe un 25% de probabilidades de desarrollar una infeccin de la vescula biliar (colecistitisaguda) o alguna otra complicacin en los siguientes 10 a 20 aos. Si ha sido sometido a [place], progrmela para el momento en que sea conveniente para usted, y para cuando no se encuentre enfermo. INSTRUCCIONES PARA EL CUIDADO DOMICILIARIO  Beba gran cantidad de lquidos claros.  Evite las comidas con mucha grasa o fritas, o cualquier alimento que empeore su dolor.  Tome los medicamentos como se le indic. SOLICITE ATENCIN MDICA SI:  Le sube la temperatura a ms de 100.5 F (38.1 C).  El dolor empeora con el [place].  Siente nuseas y [employer] comer y beber.  Tiene vmitos. SOLICITE ATENCIN MDICA DE INMEDIATO SI:  Siente dolor continuo e intenso en el abdomen, que no se alivia con medicamentos.  Siente nuseas y vmitos que no mejoran con medicamentos.  Tiene sntomas de clico biliar y comienza a Lodema Pilot y escalofros. Esto puede ser un indicio de que ha desarrollado colecistitis. Comunquese con su mdico inmediatamente.  Su piel o la parte blanca del ojo se vuelven amarillas (ictericia). Document Released: 10/20/2007 Document Revised: 10/05/2011 [facility] Patient Information 2014 [place], [place].  Infeccin urinaria  (Urinary Tract Infection)  La infeccin urinaria puede ocurrir en Corporate treasurer del tracto urinario. El tracto urinario es un sistema de drenaje del cuerpo por el que se eliminan los desechos y el exceso de [place]. El tracto urinario est formado por dos riones, dos urteres, la vejiga y Engineer, mining. Los riones son rganos que tienen forma de frijol. Cada rin tiene aproximadamente el tamao del puo. Estn situados debajo de las [place], uno a cada lado de la columna vertebral CAUSAS  La causa de la infeccin son los microbios, que son organismos  microscpicos, que incluyen hongos, virus, y bacterias. Estos organismos son tan pequeos que slo pueden verse a travs del microscopio. Las bacterias son los microorganismos que ms comnmente causan infecciones urinarias.  SNTOMAS  Los sntomas pueden variar segn la edad y el sexo del paciente y por la ubicacin de la infeccin. Los sntomas en las mujeres jvenes incluyen la necesidad frecuente e intensa de orinar y una sensacin dolorosa de ardor en la vejiga o en la uretra durante la miccin. Las mujeres y los hombres mayores podrn sentir cansancio, temblores y debilidad y Futures trader musculares y Engineer, mining abdominal. Si tiene [place], puede significar que la infeccin est en los riones. Otros sntomas son dolor en la espalda o en los lados debajo de las [place], nuseas y vmitos.  DIAGNSTICO  Para diagnosticar una infeccin urinaria, el mdico le preguntar acerca de sus sntomas. [employer] una Abrams de [place]. La muestra de orina se analiza para Engineer, manufacturing bacterias y glbulos blancos de Risk manager. Los glbulos blancos se forman en el organismo para ayudar a Artist las infecciones.  TRATAMIENTO  Por lo general, las infecciones urinarias pueden tratarse con medicamentos. Debido a que la [employer]son de las infecciones son causadas por bacterias, por lo general pueden tratarse con antibiticos. La eleccin del antibitico y la duracin del tratamiento depender de sus sntomas y el tipo de bacteria causante de la infeccin.  INSTRUCCIONES PARA EL CUIDADO EN EL HOGAR   Si le recetaron antibiticos, tmelos exactamente como su mdico le indique. Termine el medicamento aunque se sienta mejor despus de haber tomado slo algunos.  Beba gran cantidad de lquido para mantener la orina de tono claro o color amarillo plido.  Evite la cafena, el t y las 250 Hospital Placebebidas gaseosas. Estas sustancias irritan la vejiga.  Vaciar la vejiga con frecuencia. Evite retener la orina durante largos  perodos.  Vace la vejiga antes y despus de Management consultanttener relaciones sexuales.  Despus de mover el intestino, las mujeres deben higienizarse la regin perineal desde adelante hacia atrs. Use slo un papel tissue por vez. SOLICITE ATENCIN MDICA SI:   Siente dolor en la espalda.  Le sube la fiebre.  Los sntomas no mejoran luego de [address] das. SOLICITE ATENCIN MDICA DE INMEDIATO SI:   Siente dolor intenso en la espalda o en la zona inferior del abdomen.  Comienza a sentir escalofros.  Tiene nuseas o vmitos.  Tiene una sensacin continua de quemazn o molestias al ConocoPhillipsorinar. ASEGRESE DE QUE:   Comprende estas instrucciones.  Controlar su enfermedad.  Solicitar ayuda de inmediato si no mejora o empeora. Document Released: 04/22/2005 Document Revised: 04/06/2012 [facility] Patient Information 2014 [place], [place].   Emergency Department Resource Guide 1) Find a Doctor and Pay Out of Pocket Although you won't have to find out who is covered by your insurance plan, it is a good idea to ask around and get recommendations. You will then need to call the office and see if the doctor you have chosen will accept you as a new patient and what types of options they offer for patients who are self-pay. Some doctors offer discounts or will set up payment plans for their patients who do not have insurance, but you will need to ask so you aren't surprised when you get to your appointment.  2) Contact Your Local Health Department Not all health departments have doctors that can see patients for sick visits, but many do, so it is worth a call to see if yours does. If you don't know where your local health department is, you can check in your phone book. The CDC also has a tool to help you locate your state's health department, and many state websites also have listings of all of their local health departments.  3) Find a Walk-in Clinic If your illness is not likely to be very severe or complicated,  you may want to try a walk in clinic. These are popping up all over the country in pharmacies, drugstores, and shopping centers. They're usually staffed by nurse practitioners or physician assistants that have been trained to treat common illnesses and complaints. They're usually fairly quick and inexpensive. However, if you have serious medical issues or chronic medical problems, these are probably not your best option.  No Primary Care Doctor: - Call Health Connect at  [phone] - they can help you locate a primary care doctor that  accepts your insurance, provides certain services, etc. - Physician Referral Service- [phone]  Chronic Pain Problems: Organization         Address  Phone   Notes  [facility]  [phone] Patients need to be referred by their primary care doctor.   Medication Assistance: Organization         Address  Phone   Notes  [facility] Medication [facility] Program [address] [place], [place] [phone] --Must be  a resident of [facility] -- Must have NO insurance coverage whatsoever (no Medicaid/ Medicare, etc.) -- The pt. MUST have a primary care doctor that directs their care regularly and follows them in the community   MedAssist  [phone]   [employer]  [phone]    Agencies that provide inexpensive medical care: Organization         Address  Phone   Notes  [facility]  [phone]   [facility]    [phone]   [facility] [address] [place], [place] [address] [phone]   [facility] [address] [place], [place] [phone]   Planned Parenthood    [phone]   [facility]    [phone]   [facility]  [address], [place] Phone:  [phone], Fax:  [phone] Hours of Operation:  9 am - 6 pm, M-F.  Also accepts Medicaid/Medicare and  self-pay.  [facility]  [address], [place] Phone: [phone], Fax: [phone]. Hours of Operation:  8:30 am - 5:30 pm, M-F.  Also accepts Medicaid and self-pay.  [facility] [place] [address], [place] Phone: [phone]   [facility] [address] Natasha Bence [place], [place] [phone], Ext. [phone] Mondays & Thursdays: 7-9 AM.  First 15 patients are seen on a first come, first serve basis.    Medicaid-accepting [facility] Providers:  Organization         Address  Phone   Notes  [facility] [address], [place] [phone] Also accepts self-pay patients.  [facility] [address] Laurell Josephs [place], [place]  [phone]   [facility] [address], [place] [phone]   [facility] [address], [place] [phone]   Renaye Rakers [address], [place]   [phone] Only accepts [place] Access [place] patients after they have their name applied to their card.   Self-Pay (no insurance) in [facility]:  Organization         Address  Phone   Notes  Sickle Cell Patients, [facility] [address] [place], [place] [phone]   [facility] [address] [place], [place] [phone]   [facility] [place]  [address], [place] [phone]   [facility]/Dr. Osei-Bonsu  [address] [place] or [address], [place] [phone] Phone number for both [place] and [place] locations is the same.  Urgent Medical and [facility] [address] [place] [phone]   [facility] [address], [place] or [address] [phone]   [facility] [address], [place] [phone], phone;  [phone], fax Sees patients 1st and 3rd Saturday of every month.  Must not qualify for public or private insurance (i.e. Medicaid, Medicare, [place] Health Choice, Veterans' Benefits)  Household income should be no more than 200% of the poverty level The clinic cannot treat you if you are pregnant or think you are pregnant  Sexually transmitted diseases are not treated at the clinic.    Dental Care: Organization  Address  Phone  Notes  [facility] [place], [place] [phone] Accepts children up to age 67 who are enrolled in [place] or [place]; pregnant women with a Medicaid card; and children who have applied for Medicaid or [place] Health Choice, but were declined, whose parents can pay a reduced fee at time of service.  [facility] Department of [facility]  [address], [place] [phone] Accepts children up to age 12 who are enrolled in [place] or [place]; pregnant women with a Medicaid card; and children who have applied for Medicaid or [place] Health Choice, but were declined, whose parents can pay a reduced fee at time of service.  [place] Adult [facility] Access PROGRAM  [place] [phone] Patients are seen by appointment only. Walk-ins are not accepted. [place] will see patients 81 years of age and older. Monday - Tuesday (8am-5pm) Most Wednesdays (8:30-5pm) $30 per visit, cash only  [facility] Adult Dental Access PROGRAM  [address], [facility] [phone] Patients are seen by appointment only. Walk-ins are not accepted. [place] will see patients 27 years of age and older. One Wednesday Evening (Monthly: Volunteer Based).  $30 per visit, cash only  [place]  [phone] for adults; Children under age 34, call Graduate Pediatric Dentistry at [phone]. Children aged 81-14, please call  [phone] to request a pediatric application.  Dental services are provided in all areas of dental care including fillings, crowns and bridges, complete and partial dentures, implants, gum treatment, root canals, and extractions. Preventive care is also provided. Treatment is provided to both adults and children. Patients are selected via a lottery and there is often a waiting list.   [facility] [address] [place]  [phone] [email or web address]   [facility] [address] [place], [place] [phone], Ext. [phone] Second and Fourth Thursday of each month, opens at 6:30 AM; Clinic ends at 9 AM.  Patients are seen on a first-come first-served basis, and a limited number are seen during each clinic.   [facility]  [address] Hillard Danker [place], [place] [phone]   Eligibility Requirements You must have lived in [place], [place], or [place] counties for at least the last three months.   You cannot be eligible for state or federal sponsored [employer], including [employer], [place], or [employer].   You generally cannot be eligible for healthcare insurance through your employer.    How to apply: Eligibility screenings are held every Tuesday and Wednesday afternoon from 1:00 pm until 4:00 pm. You do not need an appointment for the interview!  [facility] [address], [place], [place]   [place]  [place] Department  [place]  [phone]    Behavioral Health Resources in the Community: Intensive Outpatient Programs Organization         Address  Phone  Notes  [facility] [place]. [address] [place], [place] [phone]   [facility] Outpatient [address] [place], [place]   ADS: Alcohol [facility] Drug S[facility] [address], [place], [place]   [place] [address],  [place], [place] or [phone]   Substance Abuse Resources  Organization         Address  Phone  Notes  Alcohol and Drug Services  [place]  [phone]   The [place]  [phone]   Chinita Pester  [phone]   Residential & Outpatient Substance Abuse Program  [phone]   Psychological Services Organization         Address  Phone  Notes  [facility] [place]  [place]  [phone]   [place] [address], [place] or [phone]    Mobile Crisis Teams Organization         Address  Phone  Notes  [facility], Mobile Crisis Care Unit  [phone]   [facility] Psychotherapeutic [facility]  [address] [place], [place]   [place] Levels [address], [place] [phone]    Self-Help/Support Groups Organization         Address  Phone             Notes  [place]. of [place] - variety of support groups  [place] Call for more information  Narcotics Anonymous (NA), [facility] [address], [employer] [place]  2 meetings at this location   Special educational needs teacher         Address  Phone  Notes  ASAP Residential Treatment [place],    [place]  [phone]   [facility]  [address] [place] 751025, [place], [place]   [place], [place] [phone] Admissions: 8am-3pm M-F  Incentives Substance Christian [address] [place] [address],    [place], [place] [phone]   [facility] [address] [place], [place], [place]   [facility] [address]  [place], [place]   Insight Programs - Intensive Outpatient [address] Kristeen [address], [place], [place]   [facility] [address].,  [place], [place] [phone] or [phone]   [facility] ([facility]) [address], [place], [place] Accepts Medicaid  [facility] [place] [address],  [place] [phone] Substance Abuse/Addiction Treatment   [facility] Organization         Address  Phone  Notes  [facility]  [phone]   Domenic Schwab, PhD [address] Arlis Porta [place], [place]   [phone] or [phone]   [place], [place] [phone]   [facility] [address], [place], [place] [phone] Insurance/Medicaid/sponsorship through [facility] and Families [address], [address] [place]                                    [place], [place] [phone] [place] [address], [place] [phone]    Dr. Adele Schilder  [phone]   [facility] Dept. 1) [address],  2) [place] 3)  [address], [place] [phone]  [phone]   [place] [phone] or [phone] (After Hours)

## 2013-12-26 NOTE — ED Provider Notes (Signed)
CSN: [id number]     Arrival date & time 12/26/13  3817 History   First MD Initiated Contact with Patient 12/26/13 0957     Chief Complaint  Patient presents with  . Abdominal Pain     (Consider location/radiation/quality/duration/timing/severity/associated sxs/prior Treatment) Patient is a 38 y.o. female presenting with abdominal pain. The history is provided by the patient.  Abdominal Pain Pain location:  RUQ, LUQ and epigastric Pain quality: squeezing   Pain radiates to:  Does not radiate Pain severity:  Moderate Onset quality:  Sudden Timing:  Constant Progression:  Unchanged Chronicity:  New Context: eating   Associated symptoms: nausea and vomiting   Associated symptoms: no cough, no diarrhea, no fever and no shortness of breath     Past Medical History  Diagnosis Date  . No pertinent past medical history   . Obese    Past Surgical History  Procedure Laterality Date  . No past surgeries     Family History  Problem Relation Age of Onset  . Anesthesia problems Neg Hx   . Hypotension Neg Hx   . Malignant hyperthermia Neg Hx   . Pseudochol deficiency Neg Hx    History  Substance Use Topics  . Smoking status: Never Smoker   . Smokeless tobacco: Never Used  . Alcohol Use: No   OB History   Grav Para Term Preterm Abortions TAB SAB Ect Mult Living   6 4 4  0 2 0 2 0 0 4     Review of Systems  Constitutional: Negative for fever.  Respiratory: Negative for cough and shortness of breath.   Gastrointestinal: Positive for nausea, vomiting and abdominal pain. Negative for diarrhea.  All other systems reviewed and are negative.     Allergies  Review of patient's allergies indicates no known allergies.  Home Medications   Prior to Admission medications   Not on File   BP 121/47  Pulse 70  Temp(Src) 97.8 F (36.6 C) (Oral)  Resp 20  SpO2 98%  LMP 09/25/2013 Physical Exam  Nursing note and vitals reviewed. Constitutional: She is oriented to person,  place, and time. She appears well-developed and well-nourished. No distress.  HENT:  Head: Normocephalic and atraumatic.  Eyes: EOM are normal. Pupils are equal, round, and reactive to light.  Neck: Normal range of motion. Neck supple.  Cardiovascular: Normal rate and regular rhythm.  Exam reveals no friction rub.   No murmur heard. Pulmonary/Chest: Effort normal and breath sounds normal. No respiratory distress. She has no wheezes. She has no rales.  Abdominal: Soft. She exhibits no distension. There is tenderness (RUQ, epigastric, LUQ). There is no rebound.  Musculoskeletal: Normal range of motion. She exhibits no edema.  Neurological: She is alert and oriented to person, place, and time.  Skin: She is not diaphoretic.    ED Course  Procedures (including critical care time) Labs Review Labs Reviewed  COMPREHENSIVE METABOLIC PANEL  LIPASE, BLOOD  CBC WITH DIFFERENTIAL  URINALYSIS, ROUTINE W REFLEX MICROSCOPIC  POC URINE PREG, ED    Imaging Review US Abdomen Limited Ruq  12/26/2013   CLINICAL DATA:  Right upper quadrant pain.  EXAM: US ABDOMEN LIMITED - RIGHT UPPER QUADRANT  COMPARISON:  None.  FINDINGS: Gallbladder:  No gallstones or wall thickening visualized. No sonographic Murphy sign noted.  Common bile duct:  Diameter: Normal, 5 mm.  Liver:  Increased echogenicity.  IMPRESSION: 1. Moderate hepatic steatosis. 2. No other explanation for abdominal pain.   Electronically Signed   By:  Jeronimo GreavesKyle  Talbot M.D.   On: 12/26/2013 11:25     EKG Interpretation None      MDM   Final diagnoses:  UTI (lower urinary tract infection)  Biliary colic    65F presents with epigastric pain. Began after drinking coffee this morning. No prior history of this. Associated N/V. No prior surgeries. AFVSS here. On exam, RUQ, epigastric, LUQ pain. States worse over RUQ. Concern for possible biliary colic vs cholecystitis. Will check labs, RUQ US, will give morphine, GI cocktail. Patient's pain feeling  better on re-exam. RUQ US negative for stones, cholecystitis. Likely biliary colic, possible bad gastritis. Will give PPI, vicodin. UA shows UTI, will give Keflex.   Dagmar HaitWilliam Daxten Kovalenko, MD 12/26/13 1309

## 2013-12-29 ENCOUNTER — Inpatient Hospital Stay (HOSPITAL_COMMUNITY): Admission: AD | Admit: 2013-12-29 | Payer: Medicaid Other | Admitting: Obstetrics & Gynecology

## 2013-12-29 ENCOUNTER — Encounter (HOSPITAL_COMMUNITY): Payer: Self-pay | Admitting: Emergency Medicine

## 2013-12-29 ENCOUNTER — Emergency Department (HOSPITAL_COMMUNITY)
Admission: EM | Admit: 2013-12-29 | Discharge: 2013-12-29 | Disposition: A | Discharge status: Home / Self Care | Payer: Medicaid Other | Attending: Emergency Medicine | Admitting: Emergency Medicine

## 2013-12-29 ENCOUNTER — Emergency Department (HOSPITAL_COMMUNITY): Payer: Medicaid Other

## 2013-12-29 DIAGNOSIS — M6281 Muscle weakness (generalized): Secondary | ICD-10-CM | POA: Insufficient documentation

## 2013-12-29 DIAGNOSIS — R42 Dizziness and giddiness: Secondary | ICD-10-CM | POA: Insufficient documentation

## 2013-12-29 DIAGNOSIS — Z79899 Other long term (current) drug therapy: Secondary | ICD-10-CM | POA: Insufficient documentation

## 2013-12-29 DIAGNOSIS — Z792 Long term (current) use of antibiotics: Secondary | ICD-10-CM | POA: Insufficient documentation

## 2013-12-29 DIAGNOSIS — G5620 Lesion of ulnar nerve, unspecified upper limb: Secondary | ICD-10-CM | POA: Insufficient documentation

## 2013-12-29 DIAGNOSIS — G5610 Other lesions of median nerve, unspecified upper limb: Secondary | ICD-10-CM | POA: Insufficient documentation

## 2013-12-29 LAB — COMPREHENSIVE METABOLIC PANEL
ALBUMIN: 3.6 g/dL (ref 3.5–5.2)
ALK PHOS: 73 U/L (ref 39–117)
ALT: 26 U/L (ref 0–35)
AST: 25 U/L (ref 0–37)
BUN: 14 mg/dL (ref 6–23)
CHLORIDE: 101 meq/L (ref 96–112)
CO2: 29 mEq/L (ref 19–32)
CREATININE: 0.56 mg/dL (ref 0.50–1.10)
Calcium: 9.1 mg/dL (ref 8.4–10.5)
GFR calc non Af Amer: >90 mL/min (ref >90)
Glucose, Bld: 90 mg/dL (ref 70–99)
POTASSIUM: 4.1 meq/L (ref 3.7–5.3)
Sodium: 140 mEq/L (ref 137–147)
TOTAL PROTEIN: 7.9 g/dL (ref 6.0–8.3)

## 2013-12-29 LAB — URINALYSIS, ROUTINE W REFLEX MICROSCOPIC
BILIRUBIN URINE: NEGATIVE
GLUCOSE, UA: NEGATIVE mg/dL
HGB URINE DIPSTICK: NEGATIVE
Ketones, ur: NEGATIVE mg/dL
Leukocytes, UA: NEGATIVE
Nitrite: NEGATIVE
PH: 6.5 (ref 5.0–8.0)
Protein, ur: NEGATIVE mg/dL
SPECIFIC GRAVITY, URINE: 1.025 (ref 1.005–1.030)
Urobilinogen, UA: 0.2 mg/dL (ref 0.0–1.0)

## 2013-12-29 LAB — CBC
HEMATOCRIT: 34.7 % — AB (ref 36.0–46.0)
HEMOGLOBIN: 11.1 g/dL — AB (ref 12.0–15.0)
MCH: 26.2 pg (ref 26.0–34.0)
MCHC: 32 g/dL (ref 30.0–36.0)
MCV: 82 fL (ref 78.0–100.0)
Platelets: 280 10*3/uL (ref 150–400)
RBC: 4.23 MIL/uL (ref 3.87–5.11)
RDW: 15.3 % (ref 11.5–15.5)
WBC: 6.3 10*3/uL (ref 4.0–10.5)

## 2013-12-29 LAB — I-STAT CHEM 8, ED
BUN: 13 mg/dL (ref 6–23)
Calcium, Ion: 1.22 mmol/L (ref 1.12–1.23)
Chloride: 98 mEq/L (ref 96–112)
Creatinine, Ser: 0.7 mg/dL (ref 0.50–1.10)
GLUCOSE: 90 mg/dL (ref 70–99)
HEMATOCRIT: 37 % (ref 36.0–46.0)
HEMOGLOBIN: 12.6 g/dL (ref 12.0–15.0)
POTASSIUM: 4 meq/L (ref 3.7–5.3)
SODIUM: 144 meq/L (ref 137–147)
TCO2: 28 mmol/L (ref 0–100)

## 2013-12-29 LAB — I-STAT TROPONIN, ED: TROPONIN I, POC: 0 ng/mL (ref 0.00–0.08)

## 2013-12-29 LAB — RAPID URINE DRUG SCREEN, HOSP PERFORMED
Amphetamines: NOT DETECTED
Barbiturates: POSITIVE — AB
Benzodiazepines: NOT DETECTED
COCAINE: NOT DETECTED
Opiates: NOT DETECTED
Tetrahydrocannabinol: NOT DETECTED

## 2013-12-29 LAB — DIFFERENTIAL
BASOS PCT: 0 % (ref 0–1)
Basophils Absolute: 0 10*3/uL (ref 0.0–0.1)
EOS ABS: 0.1 10*3/uL (ref 0.0–0.7)
Eosinophils Relative: 2 % (ref 0–5)
LYMPHS ABS: 2.4 10*3/uL (ref 0.7–4.0)
Lymphocytes Relative: 38 % (ref 12–46)
MONOS PCT: 7 % (ref 3–12)
Monocytes Absolute: 0.4 10*3/uL (ref 0.1–1.0)
Neutro Abs: 3.4 10*3/uL (ref 1.7–7.7)
Neutrophils Relative %: 53 % (ref 43–77)

## 2013-12-29 LAB — PROTIME-INR
INR: 0.99 (ref 0.00–1.49)
Prothrombin Time: 12.9 seconds (ref 11.6–15.2)

## 2013-12-29 LAB — APTT: aPTT: 29 seconds (ref 24–37)

## 2013-12-29 LAB — PREGNANCY, URINE: PREG TEST UR: NEGATIVE

## 2013-12-29 LAB — ETHANOL

## 2013-12-29 MED ORDER — GABAPENTIN 100 MG PO CAPS
100.0000 mg | ORAL_CAPSULE | Freq: Two times a day (BID) | ORAL | Status: DC
  Filled 2013-12-29: qty 1

## 2013-12-29 MED ORDER — GABAPENTIN 100 MG PO CAPS: 100.0000 mg | ORAL_CAPSULE | Freq: Three times a day (TID) | ORAL | Status: DC

## 2013-12-29 NOTE — Consult Note (Signed)
NEURO HOSPITALIST CONSULT NOTE    Reason for Consult: right hand paresthesias-dysesthesias with poor hand grip  HPI:                                                                                                                                          Shelley Bradley is an 38 y.o. female with no pertinent past medical history, comes in with the above mentioned complains. Mrs. Shelley Bradley stated that for the past 3 or 4 days she has been having intermittent, increasing numb sensation associated with a burning discomfort that gradually started in her right hand fingers and then moved all the way up to her elbow. Then, today at work ( she works as a Training and development officer at [employer]) she noticed poor right hand grip and became concerned about a possible stroke. Shaking her right hand provides temporary improvement of the symptoms. Denies swelling, trauma, discoloration of the right hand. No neck symptoms or HA. No vertigo, double vision, difficulty swallowing, slurred speech, language or vision impairment. Importantly, she indicated that for few months she has been experiencing numbness and " lack of sensation with burning discomfort" of the right thumb. CT brain today was unremarkable.  Past Medical History  Diagnosis Date  . No pertinent past medical history   . Obese     Past Surgical History  Procedure Laterality Date  . No past surgeries      Family History  Problem Relation Age of Onset  . Anesthesia problems Neg Hx   . Hypotension Neg Hx   . Malignant hyperthermia Neg Hx   . Pseudochol deficiency Neg Hx     Social History:  reports that she has never smoked. She has never used smokeless tobacco. She reports that she does not drink alcohol or use illicit drugs.  No Known Allergies  MEDICATIONS:                                                                                                                     I have reviewed the patient's current  medications.   ROS:  History obtained from the patient  General ROS: negative for - chills, fatigue, fever, night sweats, or weight loss Psychological ROS: negative for - behavioral disorder, hallucinations, memory difficulties, mood swings or suicidal ideation Ophthalmic ROS: negative for - blurry vision, double vision, eye pain or loss of vision ENT ROS: negative for - epistaxis, nasal discharge, oral lesions, sore throat, tinnitus or vertigo Allergy and Immunology ROS: negative for - hives or itchy/watery eyes Hematological and Lymphatic ROS: negative for - bleeding problems, bruising or swollen lymph nodes Endocrine ROS: negative for - galactorrhea, hair pattern changes, polydipsia/polyuria or temperature intolerance Respiratory ROS: negative for - cough, hemoptysis, shortness of breath or wheezing Cardiovascular ROS: negative for - chest pain, dyspnea on exertion, edema or irregular heartbeat Gastrointestinal ROS: negative for - abdominal pain, diarrhea, hematemesis, nausea/vomiting or stool incontinence Genito-Urinary ROS: negative for - dysuria, hematuria, incontinence or urinary frequency/urgency Musculoskeletal ROS: negative for - joint swelling Neurological ROS: as noted in HPI Dermatological ROS: negative for rash and skin lesion changes  Physical exam: pleasant female in no apparent distress. Blood pressure 122/80, pulse 72, temperature 98.5 F (36.9 C), temperature source Oral, resp. rate 24, last menstrual period 09/25/2013, SpO2 98.00%, unknown if currently breastfeeding. Head: normocephalic. Neck: supple, no bruits, no JVD. Cardiac: no murmurs. Lungs: clear. Abdomen: soft, no tender, no mass. Extremities: no edema. CV: pulses palpable throughout  Neurologic Examination:                                                                                                       Mental Status: Alert, oriented, thought content appropriate.  Speech fluent without evidence of aphasia.  Able to follow 3 step commands without difficulty. Cranial Nerves: II: Discs flat bilaterally; Visual fields grossly normal, pupils equal, round, reactive to light and accommodation III,IV, VI: ptosis not present, extra-ocular motions intact bilaterally V,VII: smile symmetric, facial light touch sensation normal bilaterally VIII: hearing normal bilaterally IX,X: gag reflex present XI: bilateral shoulder shrug XII: midline tongue extension without atrophy or fasciculations Motor: 5/5 left arm and leg and 5/5 proximal muscles right UE. 5/5 RLE. There is pain involved on testing of muscle strength right hand, but it appears that she may have mild weakness right opponent pollicis brevis and abductors right thumb.   Tone and bulk:normal tone throughout; no atrophy noted Sensory: Pinprick and light touch impaired dorsal aspect fingers right hand. Deep Tendon Reflexes:  1+ all over Plantars: Right: downgoing   Left: downgoing Cerebellar: normal finger-to-nose,  normal heel-to-shin test Gait: No tested    No results found for this basename: cbc, bmp, coags, chol, tri, ldl, hga1c    Results for orders placed during the hospital encounter of 12/29/13 (from the past 48 hour(s))  URINE RAPID DRUG SCREEN (HOSP PERFORMED)     Status: Abnormal   Collection Time    12/29/13  4:00 PM      Result Value Ref Range   Opiates NONE DETECTED  NONE DETECTED   Cocaine NONE DETECTED  NONE DETECTED   Benzodiazepines NONE DETECTED  NONE DETECTED   Amphetamines   NONE DETECTED  NONE DETECTED   Tetrahydrocannabinol NONE DETECTED  NONE DETECTED   Barbiturates POSITIVE (*) NONE DETECTED   Comment:            DRUG SCREEN FOR MEDICAL PURPOSES     ONLY.  IF CONFIRMATION IS NEEDED     FOR ANY PURPOSE, NOTIFY LAB     WITHIN 5 DAYS.                LOWEST DETECTABLE LIMITS      FOR URINE DRUG SCREEN     Drug Class       Cutoff (ng/mL)     Amphetamine      1000     Barbiturate      200     Benzodiazepine   200     Tricyclics       300     Opiates          300     Cocaine          300     THC              50  URINALYSIS, ROUTINE W REFLEX MICROSCOPIC     Status: None   Collection Time    12/29/13  4:00 PM      Result Value Ref Range   Color, Urine YELLOW  YELLOW   APPearance CLEAR  CLEAR   Specific Gravity, Urine 1.025  1.005 - 1.030   pH 6.5  5.0 - 8.0   Glucose, UA NEGATIVE  NEGATIVE mg/dL   Hgb urine dipstick NEGATIVE  NEGATIVE   Bilirubin Urine NEGATIVE  NEGATIVE   Ketones, ur NEGATIVE  NEGATIVE mg/dL   Protein, ur NEGATIVE  NEGATIVE mg/dL   Urobilinogen, UA 0.2  0.0 - 1.0 mg/dL   Nitrite NEGATIVE  NEGATIVE   Leukocytes, UA NEGATIVE  NEGATIVE   Comment: MICROSCOPIC NOT DONE ON URINES WITH NEGATIVE PROTEIN, BLOOD, LEUKOCYTES, NITRITE, OR GLUCOSE <1000 mg/dL.  ETHANOL     Status: None   Collection Time    12/29/13  4:13 PM      Result Value Ref Range   Alcohol, Ethyl (B) <11  0 - 11 mg/dL   Comment:            LOWEST DETECTABLE LIMIT FOR     SERUM ALCOHOL IS 11 mg/dL     FOR MEDICAL PURPOSES ONLY  PROTIME-INR     Status: None   Collection Time    12/29/13  4:13 PM      Result Value Ref Range   Prothrombin Time 12.9  11.6 - 15.2 seconds   INR 0.99  0.00 - 1.49  APTT     Status: None   Collection Time    12/29/13  4:13 PM      Result Value Ref Range   aPTT 29  24 - 37 seconds  CBC     Status: Abnormal   Collection Time    12/29/13  4:13 PM      Result Value Ref Range   WBC 6.3  4.0 - 10.5 K/uL   RBC 4.23  3.87 - 5.11 MIL/uL   Hemoglobin 11.1 (*) 12.0 - 15.0 g/dL   HCT 34.7 (*) 36.0 - 46.0 %   MCV 82.0  78.0 - 100.0 fL   MCH 26.2  26.0 - 34.0 pg   MCHC 32.0  30.0 - 36.0 g/dL   RDW 15.3  11.5 - 15.5 %   Platelets   280  150 - 400 K/uL  DIFFERENTIAL     Status: None   Collection Time    12/29/13  4:13 PM      Result Value Ref Range    Neutrophils Relative % 53  43 - 77 %   Neutro Abs 3.4  1.7 - 7.7 K/uL   Lymphocytes Relative 38  12 - 46 %   Lymphs Abs 2.4  0.7 - 4.0 K/uL   Monocytes Relative 7  3 - 12 %   Monocytes Absolute 0.4  0.1 - 1.0 K/uL   Eosinophils Relative 2  0 - 5 %   Eosinophils Absolute 0.1  0.0 - 0.7 K/uL   Basophils Relative 0  0 - 1 %   Basophils Absolute 0.0  0.0 - 0.1 K/uL  COMPREHENSIVE METABOLIC PANEL     Status: Abnormal   Collection Time    12/29/13  4:13 PM      Result Value Ref Range   Sodium 140  137 - 147 mEq/L   Potassium 4.1  3.7 - 5.3 mEq/L   Chloride 101  96 - 112 mEq/L   CO2 29  19 - 32 mEq/L   Glucose, Bld 90  70 - 99 mg/dL   BUN 14  6 - 23 mg/dL   Creatinine, Ser 0.56  0.50 - 1.10 mg/dL   Calcium 9.1  8.4 - 10.5 mg/dL   Total Protein 7.9  6.0 - 8.3 g/dL   Albumin 3.6  3.5 - 5.2 g/dL   AST 25  0 - 37 U/L   ALT 26  0 - 35 U/L   Alkaline Phosphatase 73  39 - 117 U/L   Total Bilirubin <0.2 (*) 0.3 - 1.2 mg/dL   GFR calc non Af Amer >90  >90 mL/min   GFR calc Af Amer >90  >90 mL/min   Comment: (NOTE)     The eGFR has been calculated using the CKD EPI equation.     This calculation has not been validated in all clinical situations.     eGFR's persistently <90 mL/min signify possible Chronic Kidney     Disease.  Randolm Idol, ED     Status: None   Collection Time    12/29/13  4:29 PM      Result Value Ref Range   Troponin i, poc 0.00  0.00 - 0.08 ng/mL   Comment 3            Comment: Due to the release kinetics of cTnI,     a negative result within the first hours     of the onset of symptoms does not rule out     myocardial infarction with certainty.     If myocardial infarction is still suspected,     repeat the test at appropriate intervals.  I-STAT CHEM 8, ED     Status: None   Collection Time    12/29/13  4:31 PM      Result Value Ref Range   Sodium 144  137 - 147 mEq/L   Potassium 4.0  3.7 - 5.3 mEq/L   Chloride 98  96 - 112 mEq/L   BUN 13  6 - 23 mg/dL    Creatinine, Ser 0.70  0.50 - 1.10 mg/dL   Glucose, Bld 90  70 - 99 mg/dL   Calcium, Ion 1.22  1.12 - 1.23 mmol/L   TCO2 28  0 - 100 mmol/L   Hemoglobin 12.6  12.0 - 15.0 g/dL  HCT 37.0  36.0 - 46.0 %    Ct Head Wo Contrast  12/29/2013   CLINICAL DATA:  Right hand numbness  EXAM: CT HEAD WITHOUT CONTRAST  TECHNIQUE: Contiguous axial images were obtained from the base of the skull through the vertex without intravenous contrast.  COMPARISON:  None.  FINDINGS: No acute intracranial hemorrhage. No focal mass lesion. No CT evidence of acute infarction. No midline shift or mass effect. No hydrocephalus. Basilar cisterns are patent. Paranasal sinuses and mastoid air cells are clear.  IMPRESSION: Normal head CT.   Electronically Signed   By: Stewart  Edmunds M.D.   On: 12/29/2013 16:31   Assessment/Plan: 37 y/o with complains of intermittent, increasing right hand paresthesias-dysesthesias with poor right hand grip. 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 CT brain performed in the ED is negative. Recommend:  1) NCS/EMG as outpatient ( needs follow up with neurology in 3-4 weeks). 2) Orthopedic splint to be wear at night. 3) Trial of gabapentin 300 mg at night with subsequent dose adjustment in the outpatient setting.   Osvaldo Camilo, MD 12/29/2013, 7:35 PM [facility] 

## 2013-12-29 NOTE — ED Notes (Signed)
Pt states that she has had rt arm numbness and feels like it is "alseep" for 3-4 days. No reported injury. Pt grip is weaker in rt arm. No drift present in arm. States that she wants to squeeze with her had but she cant. No other neuro deficits noted.

## 2013-12-29 NOTE — Discharge Instructions (Signed)
Dolor neuroptico (Neuropathic Pain) A menudo creemos que el dolor tiene una causa fsica. Si eliminamos la causa, el dolor debera irse. Los nervios en s mismos tambin pueden Programmer, multimediacausar dolor. Esto se denomina dolor neuroptico, que implica una anormalidad del nervio. Puede ser difcil para los pacientes que lo padecen y para los profesionales que los asisten. El dolor normalmente se describe como agudo (de corta duracin) o crnico (de larga duracin). El dolor agudo se relaciona con las sensaciones fsicas que puede provocar una lesin. Puede durar unos pocos segundos o muchas semanas, pero a menudo desaparece cuando la lesin se cura. El dolor crnico dura ms que el tiempo normal de curacin. En el dolor neuroptico, las fibras nerviosas pueden lesionarse o daarse. Entonces envan seales incorrectas a otros centros de Engineer, miningdolor. El dolor que se siente es real, pero la causa no es fcil de Clinical research associateencontrar.  CAUSAS El dolor crnico puede ser resultado de enfermedades como la diabetes y el herpes (una infeccin relacionada con la varicela), o debido a un traumatismo, una ciruga o una amputacin. Tambin puede ocurrir sin ninguna enfermedad o lesin conocida. Los nervios envan mensajes de dolor, an cuando no haya una causa identificable de tales mensajes.   Otras causas comunes de la neuropata incluyen diabetes, sndrome del Clinical research associatemiembro fantasma, o Sndrome de [employer] (SDR).  Como ocurre en todas las formas de dolor de espalda, si la neuropata no se trata correctamente, puede existir una serie de problemas asociados que pueden llevar a una espiral negativa para el paciente. Estos problemas incluyen depresin, insomnio, sensacin de miedo y [place]ansiedad, Environmental managerinteraccin social reducida e incapacidad para realizar actividades cotidianas o trabajar.  El ejemplo ms dramtico y misterioso de Engineer, miningdolor neuroptico es el denominado "sndrome del miembro fantasma". Esto ocurre cuando se amputa un brazo o una pierna debido a  una enfermedad o lesin. El cerebro contina recibiendo mensajes de los nervios que originalmente transportaban los impulsos del [place]miembro ausente. Estos nervios envan seales incorrectas y Investment banker, corporateproducen dolor.  Es comn que el dolor neuroptico no tenga una causa. Responde pobremente al tratamiento para el dolor. El dolor neuroptico puede ocurrir luego de:  Herpes (Infeccin del virus Herpes Zoster).  Una sensacin de Tenneco Incquemazn duradera en la piel, causada comnmente por una lesin en el nervio perifrico.  Neuropata perifrica que es un dao generalizado en los nervios, a menudo causado por la diabetes o el alcoholismo.  Dolor del miembro fantasma luego de una amputacin.  Problemas en los nervios faciales (neuralgia del trigmino).  Esclerosis mltiple.  Distrofia Simptica Refleja.  Dolor que Medical illustratorprovoca el cncer y la quimioterapia.  Neuropata por atrapamiento, cuando el nervio sufre presin como en el sndrome del tnel carpiano.  Problemas de la espalda, la pierna y la cadera (citica).  Ciruga de la espalda o la espina dorsal.  Infeccin de HIV o SIDA, en donde los nervios se infectan con el virus. El profesional que le asiste le explicar los puntos de esta lista que pueden estar referidos a usted. SNTOMAS Las caractersticas del dolor neuroptico son:   Es un dolor intenso, agudo, similar a Risk analystun shock elctrico, punzante, como si le clavaran un cuchillo.  Sensacin de pinchazos.  Sensacin profunda de quemazn, fro o dolor.  Debilidad persistente, hormigueo o debilidad.  La aparicion de Chesapeake Energydolor resultante de un toque suave u otro estmulo que normalmente no causara dolor.  Una mayor sensibilidad hacia cosas que normalmente causan dolor (un pinchazo). Es comn que exista un dolor persistente a partir de estmulos no  dolorosos como un toque [place]. El dolor puede persistir por meses o aos luego de que se hayan curado los tejidos daados. Cuando esto ocurre, las seales de  dolor ya no disparan la alarma ante agresiones presentes o prximas. El sistema de alarma en s no est funcionando correctamente. Dolor neuroptico Data processing manager de mejorar con [employer]. Para algunas personas, puede resultar en una incapacidad grave. Es importante saber que an un traumatismo grave en un miembro puede no producir una adecuada respuesta de proteccin Equities trader.Algunas quemaduras, cortes y otras lesiones pueden pasar inadvertidas. Sin el tratamiento [place], estas lesiones pueden infectarse o producir discapacidad. Considere seriamente cualquier lesin y consulte a su mdico para recibir TEFL teacher. DIAGNSTICO Cuando tiene dolor sin causa conocida, el profesional que lo asiste Mining engineer preguntas especficas:   Tiene otras enfermedades, como diabetes, herpes, esclerosis mltiple, o infeccin de HIV?  Cmo describira el dolor? (El dolor neuroptico a menudo se describe como punzante, lancinante, ardiente o abrasador.)  Empeora el dolor en algn momento del da? (El dolor neuroptico generalmente empeora por la noche.)  Siente que el dolor sigue determinados trayectos fsicos?  El dolor proviene de una zona que tiene nervios lesionados o ausentes? Un ejemplo sera el dolor del [employer].  El dolor se dispara por estmulos pequeos tales como el roce con las sbanas por las noches? Estas preguntas ayudan a definir el tipo de [employer]. Una vez que el profesional que lo asiste sepa lo que est ocurriendo podr Microbiologist. Los antiespasmdicos, las drogas antidepresivas, y distintos analgsicos parecen funcionar en [employer]. Si est involucrada alguna otra enfermedad, como la diabetes, un tratamiento mejor de esa enfermedad puede Engineer, materials neuroptico.  TRATAMIENTO El dolor neuroptico a menudo es de larga duracin y tiende a no responder al tratamiento de medicamentos para el dolor de tipo narctico. Puede  responder bien a otras drogas tales como anticonvulsivos y antidepresivos. Normalmente, los problemas neuropticos no se van nunca del todo, pero es posible lograr una mejora parcial con el tratamiento adecuado. Los profesionales que lo asisten tienen muchos medicamentos disponibles para tratar su problema. No se desanime si no obtiene alivio inmediato. En ocasiones deben probarse distintos medicamentos o combinaciones de ellos antes de que [employer]. Asegrese de ver al profesional que lo asiste si siente dolor que no parece provenir de ningn lado y no se va. La ayuda est a su alcance.  SOLICITE ATENCIN MDICA DE INMEDIATO SI:   Hay un cambio repentino en la calidad del dolor, especialmente si observa el cambio slo en un lado del cuerpo.  Nota modificaciones en la piel, como enrojecimiento, cambios en el color a negro o prpura, hinchazn o una lcera.  No puede mover el miembro afectado. Document Released: 10/20/2007 Document Revised: 10/05/2011 [facility] Patient Information 2014 [place], [place].

## 2013-12-29 NOTE — ED Provider Notes (Signed)
CSN: [id number]     Arrival date & time 12/29/13  1031 History   First MD Initiated Contact with Patient 12/29/13 1440     Chief Complaint  Patient presents with  . Numbness     (Consider location/radiation/quality/duration/timing/severity/associated sxs/prior Treatment) HPI Comments: The patient is an otherwise healthy 38 year old female who presents today with 2 days of worsening right arm numbness. She states it feels like it is asleep. She has not injured this area. She states it "just doesn't work". She's never had anything like this in the past. She denies any other symptoms. No fever, chills, nausea, vomiting, headache, chest pain, shortness of breath, diaphoresis. The patient has not taken any medications to improve her symptoms.  The history is provided by the patient. No language interpreter was used.    Past Medical History  Diagnosis Date  . No pertinent past medical history   . Obese    Past Surgical History  Procedure Laterality Date  . No past surgeries     Family History  Problem Relation Age of Onset  . Anesthesia problems Neg Hx   . Hypotension Neg Hx   . Malignant hyperthermia Neg Hx   . Pseudochol deficiency Neg Hx    History  Substance Use Topics  . Smoking status: Never Smoker   . Smokeless tobacco: Never Used  . Alcohol Use: No   OB History   Grav Para Term Preterm Abortions TAB SAB Ect Mult Living   6 4 4  0 2 0 2 0 0 4     Review of Systems  Constitutional: Negative for fever and chills.  Respiratory: Negative for shortness of breath.   Cardiovascular: Negative for chest pain.  Gastrointestinal: Negative for nausea, vomiting and abdominal pain.  Neurological: Positive for dizziness, weakness and numbness. Negative for headaches.  All other systems reviewed and are negative.     Allergies  Review of patient's allergies indicates no known allergies.  Home Medications   Prior to Admission medications   Medication Sig Start Date End Date  Taking? Authorizing Provider  cephALEXin (KEFLEX) 500 MG capsule Take 1 capsule (500 mg total) by mouth 4 (four) times daily. 12/26/13  Yes Dagmar Hait, MD  HYDROcodone-acetaminophen (NORCO/VICODIN) 5-325 MG per tablet Take 1 tablet by mouth every 6 (six) hours as needed for moderate pain. 12/26/13  Yes Dagmar Hait, MD  pantoprazole (PROTONIX) 20 MG tablet Take 1 tablet (20 mg total) by mouth daily. 12/26/13  Yes Dagmar Hait, MD   BP 122/80  Pulse 72  Temp(Src) 98.5 F (36.9 C) (Oral)  Resp 24  SpO2 98%  LMP 09/25/2013 Physical Exam  Nursing note and vitals reviewed. Constitutional: She is oriented to person, place, and time. She appears well-developed and well-nourished. She does not appear ill. No distress.  Morbidly obese.   HENT:  Head: Normocephalic and atraumatic.  Right Ear: External ear normal.  Left Ear: External ear normal.  Nose: Nose normal.  Mouth/Throat: Oropharynx is clear and moist.  Eyes: Conjunctivae are normal.  Neck: Normal range of motion.  Cardiovascular: Normal rate, regular rhythm and normal heart sounds.   Pulmonary/Chest: Effort normal and breath sounds normal. No stridor. No respiratory distress. She has no wheezes. She has no rales.  Abdominal: Soft. She exhibits no distension.  Musculoskeletal: Normal range of motion.  Neurological: She is alert and oriented to person, place, and time. She has normal strength. Gait normal. GCS eye subscore is 4. GCS verbal subscore is 5. GCS  motor subscore is 6.  Grip strength 4/5 on right, 5/5 on left. Unable to complete rapid alternating movements. Unable to differentiate between sharp and dull at fingertips.  Finger nose finger normal.   Skin: Skin is warm and dry. She is not diaphoretic. No erythema.  Psychiatric: She has a normal mood and affect. Her behavior is normal.    ED Course  Procedures (including critical care time) Labs Review Labs Reviewed  CBC - Abnormal; Notable for the  following:    Hemoglobin 11.1 (*)    HCT 34.7 (*)    All other components within normal limits  COMPREHENSIVE METABOLIC PANEL - Abnormal; Notable for the following:    Total Bilirubin <0.2 (*)    All other components within normal limits  URINE RAPID DRUG SCREEN (HOSP PERFORMED) - Abnormal; Notable for the following:    Barbiturates POSITIVE (*)    All other components within normal limits  ETHANOL  PROTIME-INR  APTT  DIFFERENTIAL  URINALYSIS, ROUTINE W REFLEX MICROSCOPIC  PREGNANCY, URINE  I-STAT CHEM 8, ED  I-STAT TROPOININ, ED  I-STAT TROPOININ, ED    Imaging Review Ct Head Wo Contrast  12/29/2013   CLINICAL DATA:  Right hand numbness  EXAM: CT HEAD WITHOUT CONTRAST  TECHNIQUE: Contiguous axial images were obtained from the base of the skull through the vertex without intravenous contrast.  COMPARISON:  None.  FINDINGS: No acute intracranial hemorrhage. No focal mass lesion. No CT evidence of acute infarction. No midline shift or mass effect. No hydrocephalus. Basilar cisterns are patent. Paranasal sinuses and mastoid air cells are clear.  IMPRESSION: Normal head CT.   Electronically Signed   By: Genevive BiStewart  Edmunds M.D.   On: 12/29/2013 16:31     EKG Interpretation   Date/Time:  Friday December 29 2013 14:30:09 EDT Ventricular Rate:  79 PR Interval:  145 QRS Duration: 79 QT Interval:  362 QTC Calculation: 415 R Axis:   60 Text Interpretation:  Sinus rhythm Low voltage, precordial leads  Borderline T abnormalities, diffuse leads No significant change since last  tracing Confirmed by [facility]  MD, MARTHA [phone]([id number]) on 12/29/2013 4:32:41 PM      MDM   Final diagnoses:  Median neuropathy  Ulnar neuropathy   Patient presents to ED for worsening numbness in her right hand. CT head negative. Labs unremarkable. Neurology evaluated patient and recommend orthopedic wrist splint at night and trial of gabapentin. Will given neurology follow up. Return instructions given. Vital signs stable  for discharge. Discussed case with Dr. Karma GanjaLinker who agrees with plan. Patient / Family / Caregiver informed of clinical course, understand medical decision-making process, and agree with plan.   Mora BellmanHannah S Tekisha Darcey, PA-C 12/30/13 1131

## 2013-12-29 NOTE — ED Notes (Signed)
pblt at bedside 

## 2013-12-29 NOTE — ED Notes (Signed)
Pt reassessed. Apologized to pt for wait. Pt continues to have weaker grip in right hand, but no drift present.

## 2013-12-29 NOTE — Progress Notes (Signed)
Orthopedic Tech Progress Note Patient Details:  Shelley Bradley Dec 11, 1975 [id number]  Ortho Devices Type of Ortho Device: Velcro wrist forearm splint Ortho Device/Splint Interventions: Ordered;Application;Adjustment   Shelley Bradley 12/29/2013, 8:13 PM

## 2013-12-30 NOTE — ED Provider Notes (Signed)
Medical screening examination/treatment/procedure(s) were performed by non-physician practitioner and as supervising physician I was immediately available for consultation/collaboration.   EKG Interpretation   Date/Time:  Friday December 29 2013 14:30:09 EDT Ventricular Rate:  79 PR Interval:  145 QRS Duration: 79 QT Interval:  362 QTC Calculation: 415 R Axis:   60 Text Interpretation:  Sinus rhythm Low voltage, precordial leads  Borderline T abnormalities, diffuse leads No significant change since last  tracing Confirmed by Karma Ganja  MD, Windle Huebert [phone]) on 12/29/2013 4:32:41 PM       Ethelda Chick, MD 12/30/13 1134

## 2014-04-04 ENCOUNTER — Inpatient Hospital Stay (HOSPITAL_COMMUNITY): Payer: Medicaid Other

## 2014-04-04 ENCOUNTER — Inpatient Hospital Stay (HOSPITAL_COMMUNITY)
Admission: AD | Admit: 2014-04-04 | Discharge: 2014-04-04 | Disposition: A | Discharge status: Home / Self Care | Payer: Medicaid Other | Source: Ambulatory Visit | Attending: Obstetrics & Gynecology | Admitting: Obstetrics & Gynecology

## 2014-04-04 ENCOUNTER — Encounter (HOSPITAL_COMMUNITY): Payer: Self-pay | Admitting: *Deleted

## 2014-04-04 DIAGNOSIS — N83201 Unspecified ovarian cyst, right side: Secondary | ICD-10-CM

## 2014-04-04 DIAGNOSIS — N949 Unspecified condition associated with female genital organs and menstrual cycle: Secondary | ICD-10-CM | POA: Insufficient documentation

## 2014-04-04 DIAGNOSIS — N83209 Unspecified ovarian cyst, unspecified side: Secondary | ICD-10-CM

## 2014-04-04 LAB — CBC
HEMATOCRIT: 37.4 % (ref 36.0–46.0)
HEMOGLOBIN: 12 g/dL (ref 12.0–15.0)
MCH: 26.7 pg (ref 26.0–34.0)
MCHC: 32.1 g/dL (ref 30.0–36.0)
MCV: 83.3 fL (ref 78.0–100.0)
Platelets: 251 10*3/uL (ref 150–400)
RBC: 4.49 MIL/uL (ref 3.87–5.11)
RDW: 14.8 % (ref 11.5–15.5)
WBC: 7.8 10*3/uL (ref 4.0–10.5)

## 2014-04-04 LAB — URINALYSIS, ROUTINE W REFLEX MICROSCOPIC
Bilirubin Urine: NEGATIVE
Glucose, UA: NEGATIVE mg/dL
Ketones, ur: NEGATIVE mg/dL
Nitrite: NEGATIVE
Protein, ur: NEGATIVE mg/dL
Specific Gravity, Urine: 1.02 (ref 1.005–1.030)
Urobilinogen, UA: 0.2 mg/dL (ref 0.0–1.0)
pH: 6 (ref 5.0–8.0)

## 2014-04-04 LAB — WET PREP, GENITAL
Clue Cells Wet Prep HPF POC: NONE SEEN
Trich, Wet Prep: NONE SEEN
Yeast Wet Prep HPF POC: NONE SEEN

## 2014-04-04 LAB — URINE MICROSCOPIC-ADD ON

## 2014-04-04 LAB — POCT PREGNANCY, URINE: Preg Test, Ur: NEGATIVE

## 2014-04-04 MED ORDER — KETOROLAC TROMETHAMINE 60 MG/2ML IM SOLN
60.0000 mg | Freq: Once | INTRAMUSCULAR | Status: AC
  Administered 2014-04-04: 60 mg via INTRAMUSCULAR
  Filled 2014-04-04: qty 2

## 2014-04-04 NOTE — MAU Note (Signed)
Pt states she needs an ultrasound to see if she is pregnant because she doesn't have periods and she has not had 1 in 7 yrs but has been pregnant  55yrs  and 1 yrs ago. Pt states she has have vomiting, diarrhea, pt states she is having burning when she urinates

## 2014-04-04 NOTE — Discharge Instructions (Signed)
Quiste ovrico (Ovarian Cyst) Un quiste ovrico es una bolsa llena de lquido que se forma en el ovario. Los ovarios son los rganos pequeos que producen vulos en las mujeres. Se pueden formar varios tipos de quistes en los ovarios. La mayora no son cancerosos. Muchos de ellos no causan problemas y con frecuencia desaparecen solos. Algunos pueden provocar sntomas y requerir tratamiento. Los tipos ms comunes de quistes ovricos son los siguientes:  Quistes funcionales: estos quistes pueden aparecer todos los meses durante el ciclo menstrual. Esto es normal. Estos quistes suelen desaparecer con el prximo ciclo menstrual si la mujer no queda embarazada. En general, los quistes funcionales no tienen sntomas.  Endometriomas: estos quistes se forman a partir del tejido que recubre el tero. Tambin se denominan "quistes de chocolate" porque se llenan de sangre que se vuelve marrn. Este tipo de quiste puede provocar dolor en la zona inferior del abdomen durante la relacin sexual y con el perodo menstrual.  Cistoadenomas: este tipo se desarrolla a partir de las clulas que se ubican en el exterior del ovario. Estos quistes pueden ser muy grandes y causar dolor en la zona inferior del abdomen y durante la relacin sexual. Este tipo de quiste puede girar sobre s mismo, cortar el suministro de sangre y causar un dolor intenso. Tambin se puede romper con facilidad y provocar mucho dolor.  Quistes dermoides: este tipo de quiste a veces se encuentra en ambos ovarios. Estos quistes pueden contener diferentes tipos de tejidos del organismo, como piel, dientes, pelo o cartlago. Generalmente no tienen sntomas, a menos que sean muy grandes.  Quistes tecalutenicos: aparecen cuando se produce demasiada cantidad de cierta hormona (gonadotropina corinica humana) que estimula en exceso al ovario para que produzca vulos. Esto es ms frecuente despus de procedimientos que ayudan a la concepcin de un beb  (fertilizacin in vitro). CAUSAS   Los medicamentos para la fertilidad pueden provocar una afeccin mediante la cual se forman mltiples quistes de gran tamao en los ovarios. Esta se denomina sndrome de hiperestimulacin ovrica.  El sndrome del ovario poliqustico es una afeccin que puede causar desequilibrios hormonales, los cuales pueden dar como resultado quistes ovricos no funcionales. SIGNOS Y SNTOMAS  Muchos quistes ovricos no causan sntomas. Si se presentan sntomas, stos pueden ser:  Dolor o molestias en la pelvis.  Dolor en la parte baja del abdomen.  Dolor durante las relaciones sexuales.  Aumento del permetro abdominal (hinchazn).  Perodos menstruales anormales.  Aumento del dolor en los perodos menstruales.  Cese de los perodos menstruales sin estar embarazada. DIAGNSTICO  Estos quistes se descubren comnmente durante un examen de rutina o una exploracin ginecolgica anual. Es posible que se ordenen otros estudios para obtener ms informacin sobre el quiste. Estos estudios pueden ser:  Ecografas.  Radiografas de la pelvis.  Tomografa computada.  Resonancia magntica.  Anlisis de sangre. TRATAMIENTO  Muchos de los quistes ovricos desaparecen por s solos, sin tratamiento. Es probable que el mdico quiera controlar el quiste regularmente durante 2 o 3meses para ver si se produce algn cambio. En el caso de las mujeres en la menopausia, es particularmente importante controlar de cerca al quiste ya que el ndice de cncer de ovario en las mujeres menopusicas es ms alto. Cuando se requiere tratamiento, este puede incluir cualquiera de los siguientes:  Un procedimiento para drenar el quiste (aspiracin). Esto se puede realizar mediante el uso de una aguja grande y una ecografa. Tambin se puede hacer a travs de un procedimiento laparoscpico, En   este procedimiento, se inserta un tubo delgado que emite luz y que tiene una pequea cmara en un  extremo (laparoscopio) a travs de una pequea incisin.  Ciruga para extirpar el quiste completo. Esto se puede realizar mediante una ciruga laparoscpica o una ciruga abierta, la cual implica realizar una incisin ms grande en la parte inferior del abdomen.  Tratamiento hormonal o pldoras anticonceptivas. Estos mtodos a veces se usan para ayudar a disolver un quiste. INSTRUCCIONES PARA EL CUIDADO EN EL HOGAR   Tome solo medicamentos de venta libre o recetados, segn las indicaciones del mdico.  Concurra a las consultas de control con su mdico segn las indicaciones.  Hgase exmenes plvicos regulares y pruebas de Papanicolaou. SOLICITE ATENCIN MDICA SI:   Los perodos se atrasan, son irregulares, dolorosos o cesan.  El dolor plvico o abdominal no desaparece.  El abdomen se agranda o se hincha.  Siente presin en la vejiga o no puede vaciarla completamente.  Siente dolor durante las relaciones sexuales.  Tiene una sensacin de hinchazn, presin o molestias en el estmago.  Pierde peso sin razn aparente.  Siente un malestar generalizado.  Est estreida.  Pierde el apetito.  Le aparece acn.  Nota un aumento del vello corporal y facial.  Aumenta de peso sin hacer modificaciones en su actividad fsica y en su dieta habitual.  Sospecha que est embarazada. SOLICITE ATENCIN MDICA DE INMEDIATO SI:   Siente cada vez ms dolor abdominal.  Tiene malestar estomacal (nuseas) y vomita.  Tiene fiebre que se presenta de manera repentina.  Siente dolor abdominal al defecar.  Sus perodos menstruales son ms abundantes que lo habitual. ASEGRESE DE QUE:   Comprende estas instrucciones.  Controlar su afeccin.  Recibir ayuda de inmediato si no mejora o si empeora. Document Released: 04/22/2005 Document Revised: 07/18/2013 [employer] Patient Information 2015 [employer]. This information is not intended to replace advice given to you by your health  care provider. Make sure you discuss any questions you have with your health care provider.  

## 2014-04-04 NOTE — Progress Notes (Signed)
I assisted the midwife to explain the result from the ultrasound, and discharges instructions. By Orlan Leavens, Spanish Interpreter

## 2014-04-04 NOTE — MAU Provider Note (Signed)
History     CSN: [id number]  Arrival date and time: 04/04/14 1191   First Provider Initiated Contact with Patient 04/04/14 2053      Chief Complaint  Patient presents with  . Pelvic Pain   HPI  Shelley Bradley is a 38 y.o. [id number] who presents today with pelvic and vaginal pain x 4 days. She has not taken anything for the pain at this time. She states that she has not seen Dr. Gaynell Face in "a long time", and did not call his office for an appointment for this concern. She denies any vaginal discharge.   Past Medical History  Diagnosis Date  . No pertinent past medical history   . Obese     Past Surgical History  Procedure Laterality Date  . No past surgeries      Family History  Problem Relation Age of Onset  . Anesthesia problems Neg Hx   . Hypotension Neg Hx   . Malignant hyperthermia Neg Hx   . Pseudochol deficiency Neg Hx     History  Substance Use Topics  . Smoking status: Never Smoker   . Smokeless tobacco: Never Used  . Alcohol Use: No    Allergies: No Known Allergies  Prescriptions prior to admission  Medication Sig Dispense Refill  . cephALEXin (KEFLEX) 500 MG capsule Take 1 capsule (500 mg total) by mouth 4 (four) times daily.  28 capsule  0  . gabapentin (NEURONTIN) 100 MG capsule Take 1 capsule (100 mg total) by mouth 3 (three) times daily.  30 capsule  0  . HYDROcodone-acetaminophen (NORCO/VICODIN) 5-325 MG per tablet Take 1 tablet by mouth every 6 (six) hours as needed for moderate pain.  20 tablet  0  . pantoprazole (PROTONIX) 20 MG tablet Take 1 tablet (20 mg total) by mouth daily.  30 tablet  0    ROS Physical Exam   Blood pressure 129/70, pulse 96, temperature 99.5 F (37.5 C), temperature source Oral, resp. rate 20, height  (1.575 m), weight 173.728 kg (383 lb), SpO2 99.00%, unknown if currently breastfeeding.  Physical Exam  Nursing note and vitals reviewed. Constitutional: She is oriented to person, place, and time. She  appears well-developed and well-nourished. No distress.  Cardiovascular: Normal rate.   Respiratory: Effort normal.  GI: Soft. There is no tenderness.  Genitourinary:   External: no lesion Vagina: small amount of white discharge Cervix: pink, smooth, no CMT Uterus: mobile, but otherwise unable to assess size 2/2 body habitus  Adnexa: unable to assess 2/2 body habitus    Neurological: She is alert and oriented to person, place, and time.  Skin: Skin is warm and dry.  Psychiatric: She has a normal mood and affect.    MAU Course  Procedures Results for orders placed during the hospital encounter of 04/04/14 (from the past 24 hour(s))  URINALYSIS, ROUTINE W REFLEX MICROSCOPIC     Status: Abnormal   Collection Time    04/04/14  8:00 PM      Result Value Ref Range   Color, Urine YELLOW  YELLOW   APPearance CLEAR  CLEAR   Specific Gravity, Urine 1.020  1.005 - 1.030   pH 6.0  5.0 - 8.0   Glucose, UA NEGATIVE  NEGATIVE mg/dL   Hgb urine dipstick SMALL (*) NEGATIVE   Bilirubin Urine NEGATIVE  NEGATIVE   Ketones, ur NEGATIVE  NEGATIVE mg/dL   Protein, ur NEGATIVE  NEGATIVE mg/dL   Urobilinogen, UA 0.2  0.0 - 1.0 mg/dL  Nitrite NEGATIVE  NEGATIVE   Leukocytes, UA SMALL (*) NEGATIVE  URINE MICROSCOPIC-ADD ON     Status: None   Collection Time    04/04/14  8:00 PM      Result Value Ref Range   Squamous Epithelial / LPF RARE  RARE   WBC, UA 3-6  <3 WBC/hpf   RBC / HPF 0-2  <3 RBC/hpf   Bacteria, UA RARE  RARE  POCT PREGNANCY, URINE     Status: None   Collection Time    04/04/14  8:32 PM      Result Value Ref Range   Preg Test, Ur NEGATIVE  NEGATIVE  WET PREP, GENITAL     Status: Abnormal   Collection Time    04/04/14  9:00 PM      Result Value Ref Range   Yeast Wet Prep HPF POC NONE SEEN  NONE SEEN   Trich, Wet Prep NONE SEEN  NONE SEEN   Clue Cells Wet Prep HPF POC NONE SEEN  NONE SEEN   WBC, Wet Prep HPF POC MODERATE (*) NONE SEEN  CBC     Status: None   Collection Time     04/04/14  9:25 PM      Result Value Ref Range   WBC 7.8  4.0 - 10.5 K/uL   RBC 4.49  3.87 - 5.11 MIL/uL   Hemoglobin 12.0  12.0 - 15.0 g/dL   HCT 04.5  40.9 - 81.1 %   MCV 83.3  78.0 - 100.0 fL   MCH 26.7  26.0 - 34.0 pg   MCHC 32.1  30.0 - 36.0 g/dL   RDW 91.4  78.2 - 95.6 %   Platelets 251  150 - 400 K/uL   US Transvaginal Non-ob  04/04/2014   CLINICAL DATA:  Pelvic pain.  Morbid obesity.  EXAM: TRANSABDOMINAL AND TRANSVAGINAL ULTRASOUND OF PELVIS  TECHNIQUE: Both transabdominal and transvaginal ultrasound examinations of the pelvis were performed. Transabdominal technique was performed for global imaging of the pelvis including uterus, ovaries, adnexal regions, and pelvic cul-de-sac. It was necessary to proceed with endovaginal exam following the transabdominal exam to visualize the endometrium and ovaries.  COMPARISON:  None  FINDINGS: Uterus  Measurements: 10.2 x 4.6 x 4.8 cm, anteverted. No fibroids or other mass visualized. Small nabothian cysts in the cervix.  Endometrium  Thickness: 12.6 mm.  No focal abnormality visualized.  Right ovary  Limited visualization on the right ovary on transabdominal views only. There appears to be a right ovarian cyst measuring about 5.3 cm maximal diameter. As visualized, this appears to be a simple cyst.  Left ovary  Left ovary is not visualized.  Other findings  No free pelvic fluid collections.  IMPRESSION: 5.3 cm right ovarian cyst is almost certainly benign. Based on size, consider follow-up in 1 year. This recommendation follows the consensus statement: Management of Asymptomatic Ovarian and Other Adnexal Cysts Imaged at [place]: Society of Radiologists in Ultrasound Consensus Conference Statement. Radiology 2010; [phone].   Electronically Signed   By: Burman Nieves M.D.   On: 04/04/2014 22:34   US Pelvis Complete  04/04/2014   CLINICAL DATA:  Pelvic pain.  Morbid obesity.  EXAM: TRANSABDOMINAL AND TRANSVAGINAL ULTRASOUND OF PELVIS  TECHNIQUE: Both  transabdominal and transvaginal ultrasound examinations of the pelvis were performed. Transabdominal technique was performed for global imaging of the pelvis including uterus, ovaries, adnexal regions, and pelvic cul-de-sac. It was necessary to proceed with endovaginal exam following the transabdominal exam to visualize  the endometrium and ovaries.  COMPARISON:  None  FINDINGS: Uterus  Measurements: 10.2 x 4.6 x 4.8 cm, anteverted. No fibroids or other mass visualized. Small nabothian cysts in the cervix.  Endometrium  Thickness: 12.6 mm.  No focal abnormality visualized.  Right ovary  Limited visualization on the right ovary on transabdominal views only. There appears to be a right ovarian cyst measuring about 5.3 cm maximal diameter. As visualized, this appears to be a simple cyst.  Left ovary  Left ovary is not visualized.  Other findings  No free pelvic fluid collections.  IMPRESSION: 5.3 cm right ovarian cyst is almost certainly benign. Based on size, consider follow-up in 1 year. This recommendation follows the consensus statement: Management of Asymptomatic Ovarian and Other Adnexal Cysts Imaged at [place]: Society of Radiologists in Ultrasound Consensus Conference Statement. Radiology 2010; [phone].   Electronically Signed   By: Burman Nieves M.D.   On: 04/04/2014 22:34   2314: Patient reports that her pain has resolved with toradol.  Offered a clinic appointment for GYN care or to see Dr. Gaynell Face, patient would like to go back to Dr. Gaynell Face.  Assessment and Plan   1. Cyst of right ovary    Comfort measures reviewed Return to MAU as needed  Follow-up Information   Follow up with MARSHALL,BERNARD A, MD In 3 months.   Specialty:  Obstetrics and Gynecology   Contact information:   [address] [place] [address] [phone]        Tawnya Crook 04/04/2014, 9:03 PM

## 2014-04-04 NOTE — MAU Note (Signed)
Pt reports lower abd pain x 4 days, vaginal pain x 4 days. Pain with urination and fever.

## 2014-04-05 LAB — GC/CHLAMYDIA PROBE AMP
CT Probe RNA: NEGATIVE
GC Probe RNA: NEGATIVE

## 2014-04-05 LAB — HIV ANTIBODY (ROUTINE TESTING W REFLEX): HIV 1&2 Ab, 4th Generation: NONREACTIVE

## 2014-04-06 LAB — URINE CULTURE

## 2014-04-09 NOTE — MAU Provider Note (Signed)
Attestation of Attending Supervision of Advanced Practitioner: Evaluation and management procedures were performed by the PA/NP/CNM/OB Fellow under my supervision/collaboration. Chart reviewed and agree with management and plan.  Desiderio Dolata V 04/09/2014 6:33 PM   

## 2014-05-28 ENCOUNTER — Encounter (HOSPITAL_COMMUNITY): Payer: Self-pay | Admitting: *Deleted

## 2014-10-06 ENCOUNTER — Encounter (HOSPITAL_COMMUNITY): Payer: Self-pay | Admitting: *Deleted

## 2014-10-06 ENCOUNTER — Inpatient Hospital Stay (HOSPITAL_COMMUNITY)
Admission: AD | Admit: 2014-10-06 | Discharge: 2014-10-06 | Disposition: A | Discharge status: Home / Self Care | Payer: Self-pay | Source: Ambulatory Visit | Attending: Obstetrics | Admitting: Obstetrics

## 2014-10-06 DIAGNOSIS — N939 Abnormal uterine and vaginal bleeding, unspecified: Secondary | ICD-10-CM

## 2014-10-06 DIAGNOSIS — D696 Thrombocytopenia, unspecified: Secondary | ICD-10-CM

## 2014-10-06 DIAGNOSIS — R1084 Generalized abdominal pain: Secondary | ICD-10-CM | POA: Insufficient documentation

## 2014-10-06 LAB — URINALYSIS, ROUTINE W REFLEX MICROSCOPIC
BILIRUBIN URINE: NEGATIVE
Glucose, UA: NEGATIVE mg/dL
Ketones, ur: NEGATIVE mg/dL
Nitrite: NEGATIVE
PH: 7 (ref 5.0–8.0)
PROTEIN: NEGATIVE mg/dL
Specific Gravity, Urine: 1.015 (ref 1.005–1.030)
UROBILINOGEN UA: 0.2 mg/dL (ref 0.0–1.0)

## 2014-10-06 LAB — COMPREHENSIVE METABOLIC PANEL
ALT: 21 U/L (ref 0–35)
AST: 34 U/L (ref 0–37)
Albumin: 3.5 g/dL (ref 3.5–5.2)
Alkaline Phosphatase: 74 U/L (ref 39–117)
Anion gap: 6 (ref 5–15)
BILIRUBIN TOTAL: 0.9 mg/dL (ref 0.3–1.2)
BUN: 12 mg/dL (ref 6–23)
CHLORIDE: 103 mmol/L (ref 96–112)
CO2: 29 mmol/L (ref 19–32)
Calcium: 8.7 mg/dL (ref 8.4–10.5)
Creatinine, Ser: 0.62 mg/dL (ref 0.50–1.10)
GFR calc Af Amer: >90 mL/min (ref >90)
GFR calc non Af Amer: >90 mL/min (ref >90)
Glucose, Bld: 97 mg/dL (ref 70–99)
POTASSIUM: 5.1 mmol/L (ref 3.5–5.1)
Sodium: 138 mmol/L (ref 135–145)
TOTAL PROTEIN: 7.9 g/dL (ref 6.0–8.3)

## 2014-10-06 LAB — CBC
HCT: 36.3 % (ref 36.0–46.0)
HEMOGLOBIN: 11.4 g/dL — AB (ref 12.0–15.0)
MCH: 26 pg (ref 26.0–34.0)
MCHC: 31.4 g/dL (ref 30.0–36.0)
MCV: 82.7 fL (ref 78.0–100.0)
PLATELETS: 42 10*3/uL — AB (ref 150–400)
RBC: 4.39 MIL/uL (ref 3.87–5.11)
RDW: 14.9 % (ref 11.5–15.5)
WBC: 4.9 10*3/uL (ref 4.0–10.5)

## 2014-10-06 LAB — WET PREP, GENITAL
CLUE CELLS WET PREP: NONE SEEN
Trich, Wet Prep: NONE SEEN
Yeast Wet Prep HPF POC: NONE SEEN

## 2014-10-06 LAB — URINE MICROSCOPIC-ADD ON

## 2014-10-06 LAB — HCG, QUANTITATIVE, PREGNANCY

## 2014-10-06 LAB — POCT PREGNANCY, URINE: Preg Test, Ur: NEGATIVE

## 2014-10-06 MED ORDER — MEDROXYPROGESTERONE ACETATE 10 MG PO TABS: 10.0000 mg | ORAL_TABLET | Freq: Every day | ORAL | Status: DC

## 2014-10-06 NOTE — MAU Provider Note (Signed)
History     CSN: [id number]  Arrival date and time: 10/06/14 [id number]   First Provider Initiated Contact with Patient 10/06/14 1850      Chief Complaint  Patient presents with  . Vaginal Bleeding   HPI Shelley Bradley 39 y.o. Interpreter in the room for interview and exam.  Client comes to MAU with lower abdominal cramping and reports daily vaginal bleeding since November.  Has been heavier at times but reports using 10-12 pads a day including today.   Also indicates that with 2 previous pregnancies, her urine pregnancy tests were negative.  She has a one year old and a 37 year old.  She desires another pregnancy.  She does not use contraception.  OB History    Gravida Para Term Preterm AB TAB SAB Ectopic Multiple Living   6 4 4  0 2 0 2 0 0 4      Past Medical History  Diagnosis Date  . No pertinent past medical history   . Obese     Past Surgical History  Procedure Laterality Date  . No past surgeries      Family History  Problem Relation Age of Onset  . Anesthesia problems Neg Hx   . Hypotension Neg Hx   . Malignant hyperthermia Neg Hx   . Pseudochol deficiency Neg Hx     History  Substance Use Topics  . Smoking status: Never Smoker   . Smokeless tobacco: Never Used  . Alcohol Use: No    Allergies: No Known Allergies  Prescriptions prior to admission  Medication Sig Dispense Refill Last Dose  . ibuprofen (ADVIL,MOTRIN) 200 MG tablet Take 400 mg by mouth every 6 (six) hours as needed for moderate pain.   04/04/2014 at Unknown time    Review of Systems  Constitutional: Negative for fever.  Gastrointestinal: Positive for abdominal pain. Negative for nausea, vomiting and diarrhea.  Genitourinary:       No vaginal discharge. No vaginal bleeding. No dysuria.   Physical Exam   Height 5\' 1"  (1.549 m), weight 398 lb 4 oz (180.645 kg), last menstrual period 09/19/2014, unknown if currently breastfeeding.  Physical Exam  Nursing note and vitals  reviewed. Constitutional: She is oriented to person, place, and time. She appears well-developed and well-nourished.  Morbidly obese  HENT:  Head: Normocephalic.  Eyes: EOM are normal.  Neck: Neck supple.  GI: Soft. There is tenderness. There is no rebound and no guarding.  Tender in all quadrants including upper quadrants.  Reports feeling movement in her abdomen after palpation.  Genitourinary:  Speculum exam: Vulva  - dampness noted on vulva and upper thighs Vagina - Small amount of thin transparent red discharge Cervix - No contact bleeding, bloody mucus only, no active bleeding from cervix Bimanual exam: Cervix closed Uterus - exam limited by habitus, no marked tenderness Adnexa- exam limited by habitus, no marked tenderness  GC/Chlam, wet prep done Chaperone present for exam.  Musculoskeletal: Normal range of motion.  Neurological: She is alert and oriented to person, place, and time.  Skin: Skin is warm and dry.  Psychiatric: She has a normal mood and affect.    MAU Course  Procedures  MDM Care assumed by Telford NabN. Romey Cohea, CNM at 2000.  Awaiting lab results.    Assessment and Plan    BURLESON,TERRI 10/06/2014, 7:07 PM   Results for orders placed or performed during the hospital encounter of 10/06/14 (from the past 24 hour(s))  Urinalysis, Routine w reflex microscopic  Status: Abnormal   Collection Time: 10/06/14  6:33 PM  Result Value Ref Range   Color, Urine YELLOW YELLOW   APPearance CLOUDY (A) CLEAR   Specific Gravity, Urine 1.015 1.005 - 1.030   pH 7.0 5.0 - 8.0   Glucose, UA NEGATIVE NEGATIVE mg/dL   Hgb urine dipstick LARGE (A) NEGATIVE   Bilirubin Urine NEGATIVE NEGATIVE   Ketones, ur NEGATIVE NEGATIVE mg/dL   Protein, ur NEGATIVE NEGATIVE mg/dL   Urobilinogen, UA 0.2 0.0 - 1.0 mg/dL   Nitrite NEGATIVE NEGATIVE   Leukocytes, UA TRACE (A) NEGATIVE  Urine microscopic-add on     Status: Abnormal   Collection Time: 10/06/14  6:33 PM  Result Value Ref  Range   WBC, UA 0-2 <3 WBC/hpf   RBC / HPF 11-20 <3 RBC/hpf   Bacteria, UA FEW (A) RARE  Pregnancy, urine POC     Status: None   Collection Time: 10/06/14  6:41 PM  Result Value Ref Range   Preg Test, Ur NEGATIVE NEGATIVE  Wet prep, genital     Status: Abnormal   Collection Time: 10/06/14  7:05 PM  Result Value Ref Range   Yeast Wet Prep HPF POC NONE SEEN NONE SEEN   Trich, Wet Prep NONE SEEN NONE SEEN   Clue Cells Wet Prep HPF POC NONE SEEN NONE SEEN   WBC, Wet Prep HPF POC FEW (A) NONE SEEN  CBC     Status: Abnormal   Collection Time: 10/06/14  7:25 PM  Result Value Ref Range   WBC 4.9 4.0 - 10.5 K/uL   RBC 4.39 3.87 - 5.11 MIL/uL   Hemoglobin 11.4 (L) 12.0 - 15.0 g/dL   HCT 16.1 09.6 - 04.5 %   MCV 82.7 78.0 - 100.0 fL   MCH 26.0 26.0 - 34.0 pg   MCHC 31.4 30.0 - 36.0 g/dL   RDW 40.9 81.1 - 91.4 %   Platelets 42 (L) 150 - 400 K/uL  Comprehensive metabolic panel     Status: None   Collection Time: 10/06/14  7:25 PM  Result Value Ref Range   Sodium 138 135 - 145 mmol/L   Potassium 5.1 3.5 - 5.1 mmol/L   Chloride 103 96 - 112 mmol/L   CO2 29 19 - 32 mmol/L   Glucose, Bld 97 70 - 99 mg/dL   BUN 12 6 - 23 mg/dL   Creatinine, Ser 7.82 0.50 - 1.10 mg/dL   Calcium 8.7 8.4 - 95.6 mg/dL   Total Protein 7.9 6.0 - 8.3 g/dL   Albumin 3.5 3.5 - 5.2 g/dL   AST 34 0 - 37 U/L   ALT 21 0 - 35 U/L   Alkaline Phosphatase 74 39 - 117 U/L   Total Bilirubin 0.9 0.3 - 1.2 mg/dL   GFR calc non Af Amer >90 >90 mL/min   GFR calc Af Amer >90 >90 mL/min   Anion gap 6 5 - 15  hCG, quantitative, pregnancy     Status: None   Collection Time: 10/06/14  7:25 PM  Result Value Ref Range   hCG, Beta Chain, Quant, S <1 <5 mIU/mL   A/P: 1. Abnormal vaginal bleeding   2. Thrombocytopenia    - Pt reports ongoing bleeding x months, hgb 11.4 today, no active bleeding on exam. Pt requests something to stop bleeding, rx given for Provera 10 mg daily, advised outpatient f/u w/ Dr. Gaynell Face for ongoing  eval/mgmt of abnl bleeding  - Thrombocytopenia noted on CBC - advised pt  to see f/u w/ PCP ASAP, resources provided  - Generalized abdominal pain w/ nonspecific exam, does not appear to be GYN in origin, WBC WNL, pt advised to address this with PCP as well or f/u w/ Urgent Care or general ED w/ worsening symptoms    Medication List    STOP taking these medications        ibuprofen 200 MG tablet  Commonly known as:  ADVIL,MOTRIN      TAKE these medications        medroxyPROGESTERone 10 MG tablet  Commonly known as:  PROVERA  Take 1 tablet (10 mg total) by mouth daily.            Follow-up Information    Follow up with Primary Care Provider.   Why:  to follow up regarding your lab results

## 2014-10-06 NOTE — MAU Note (Signed)
Pt stated via Benita, spanish interpreter, that she was feeling light headed when walking in to triage. Pt directly to bed.

## 2014-10-06 NOTE — MAU Note (Signed)
Pt stated she started having vaginal bleeding about 15 days ago. C/O increased pain and cramping and bleeding. Pt stated she had large blob come out 15 days ago and the bleeding has not stopped.

## 2014-10-06 NOTE — Discharge Instructions (Signed)
Sangrado uterino anormal (Abnormal Uterine Bleeding) El sangrado uterino anormal puede afectar a las mujeres que estn en diversas etapas de la vida, desde adolescentes, mujeres frtiles y mujeres embarazadas, hasta mujeres que han llegado a la menopausia. Hay diversas clases de sangrado uterino que se consideran anormales, entre ellas:  Prdidas de sangre o hemorragias entre los perodos.  Hemorragias luego de mantener relaciones sexuales.  Sangrado abundante o ms que lo habitual.  Perodos que duran ms que lo normal.  Sangrado luego de la menopausia. Muchos casos de sangrado uterino anormal son leves y simples de tratar, mientras que otros son ms graves. El mdico debe evaluar cualquier clase de sangrado anormal. El tratamiento depender de la causa del sangrado. INSTRUCCIONES PARA EL CUIDADO EN EL HOGAR Controle su afeccin para ver si hay cambios. Las siguientes indicaciones ayudarn a aliviar cualquier molestia que pueda sentir:  Evite las duchas vaginales y el uso de tampones segn las indicaciones del mdico.  Cmbiese las compresas con frecuencia. Deber hacerse exmenes plvicos regulares y pruebas de Papanicolaou. Cumpla con todas las visitas de control y exmanes diagnsticos, segn le indique su mdico.  SOLICITE ATENCIN MDICA SI:   El sangrado dura ms de 1 semana.  Se siente mareada por momentos. SOLICITE ATENCIN MDICA DE INMEDIATO SI:   Se desmaya.  Debe cambiarse la compresa cada 15 a 30 minutos.  Siente dolor abdominal.  Tiene fiebre.  Se siente dbil o presenta sudoracin.  Elimina cogulos grandes por la vagina.  Comienza a sentir nuseas y vomita. ASEGRESE DE QUE:   Comprende estas instrucciones.  Controlar su afeccin.  Recibir ayuda de inmediato si no mejora o si empeora. Document Released: 07/13/2005 Document Revised: 07/18/2013 ExitCare Patient Information 2015 [employer]. This information is not intended to replace advice given  to you by your health care provider. Make sure you discuss any questions you have with your health care provider.  

## 2014-10-07 LAB — RPR: RPR Ser Ql: NONREACTIVE

## 2014-10-07 LAB — HIV ANTIBODY (ROUTINE TESTING W REFLEX): HIV SCREEN 4TH GENERATION: NONREACTIVE

## 2014-10-07 NOTE — Progress Notes (Signed)
I assisted RN with interpretation of triage questions.  Spanish Interpreter

## 2014-10-07 NOTE — Progress Notes (Signed)
Assisted RN with interpretation of discharge instructions.  °Spanish Interpreter  °

## 2014-10-07 NOTE — Progress Notes (Signed)
Assisted Admissions with interpretation of registration with patient.  Spanish Interpreter

## 2014-10-08 LAB — GC/CHLAMYDIA PROBE AMP (~~LOC~~) NOT AT ARMC
CHLAMYDIA, DNA PROBE: NEGATIVE
Neisseria Gonorrhea: NEGATIVE

## 2014-10-25 ENCOUNTER — Inpatient Hospital Stay (HOSPITAL_COMMUNITY)
Admission: AD | Admit: 2014-10-25 | Discharge: 2014-10-25 | Disposition: A | Discharge status: Home / Self Care | Payer: Self-pay | Source: Ambulatory Visit | Attending: Obstetrics | Admitting: Obstetrics

## 2014-10-25 ENCOUNTER — Encounter (HOSPITAL_COMMUNITY): Payer: Self-pay | Admitting: *Deleted

## 2014-10-25 ENCOUNTER — Ambulatory Visit (INDEPENDENT_AMBULATORY_CARE_PROVIDER_SITE_OTHER): Payer: Self-pay | Admitting: Emergency Medicine

## 2014-10-25 ENCOUNTER — Inpatient Hospital Stay (HOSPITAL_COMMUNITY): Payer: Self-pay

## 2014-10-25 VITALS — HR 90 | Temp 98.4°F | Resp 20 | Ht 62.5 in | Wt >= 6400 oz

## 2014-10-25 DIAGNOSIS — N8320 Unspecified ovarian cysts: Secondary | ICD-10-CM | POA: Insufficient documentation

## 2014-10-25 DIAGNOSIS — E669 Obesity, unspecified: Secondary | ICD-10-CM | POA: Insufficient documentation

## 2014-10-25 DIAGNOSIS — D500 Iron deficiency anemia secondary to blood loss (chronic): Secondary | ICD-10-CM | POA: Insufficient documentation

## 2014-10-25 DIAGNOSIS — N939 Abnormal uterine and vaginal bleeding, unspecified: Secondary | ICD-10-CM | POA: Insufficient documentation

## 2014-10-25 DIAGNOSIS — N83201 Unspecified ovarian cyst, right side: Secondary | ICD-10-CM

## 2014-10-25 DIAGNOSIS — N390 Urinary tract infection, site not specified: Secondary | ICD-10-CM | POA: Insufficient documentation

## 2014-10-25 LAB — CBC
HCT: 34.6 % — ABNORMAL LOW (ref 36.0–46.0)
Hemoglobin: 10.7 g/dL — ABNORMAL LOW (ref 12.0–15.0)
MCH: 25.5 pg — AB (ref 26.0–34.0)
MCHC: 30.9 g/dL (ref 30.0–36.0)
MCV: 82.4 fL (ref 78.0–100.0)
Platelets: 269 10*3/uL (ref 150–400)
RBC: 4.2 MIL/uL (ref 3.87–5.11)
RDW: 15.3 % (ref 11.5–15.5)
WBC: 7 10*3/uL (ref 4.0–10.5)

## 2014-10-25 LAB — URINALYSIS, ROUTINE W REFLEX MICROSCOPIC
Bilirubin Urine: NEGATIVE
Glucose, UA: 250 mg/dL — AB
KETONES UR: 15 mg/dL — AB
Nitrite: POSITIVE — AB
Protein, ur: >300 mg/dL — AB
Specific Gravity, Urine: 1.025 (ref 1.005–1.030)
UROBILINOGEN UA: 2 mg/dL — AB (ref 0.0–1.0)
pH: 5 (ref 5.0–8.0)

## 2014-10-25 LAB — URINE MICROSCOPIC-ADD ON

## 2014-10-25 LAB — POCT PREGNANCY, URINE: Preg Test, Ur: NEGATIVE

## 2014-10-25 MED ORDER — KETOROLAC TROMETHAMINE 60 MG/2ML IM SOLN
60.0000 mg | INTRAMUSCULAR | Status: AC
  Administered 2014-10-25: 60 mg via INTRAMUSCULAR
  Filled 2014-10-25: qty 2

## 2014-10-25 MED ORDER — MEGESTROL ACETATE 40 MG PO TABS
40.0000 mg | ORAL_TABLET | Freq: Once | ORAL | Status: AC
  Administered 2014-10-25: 40 mg via ORAL
  Filled 2014-10-25: qty 1

## 2014-10-25 MED ORDER — OXYCODONE-ACETAMINOPHEN 5-325 MG PO TABS
2.0000 | ORAL_TABLET | ORAL | Status: AC
  Administered 2014-10-25: 2 via ORAL
  Filled 2014-10-25: qty 2

## 2014-10-25 MED ORDER — OXYCODONE-ACETAMINOPHEN 5-325 MG PO TABS
2.0000 | ORAL_TABLET | ORAL | Status: AC
Start: 2014-10-25 — End: 2014-10-25
  Administered 2014-10-25: 2 via ORAL
  Filled 2014-10-25: qty 2

## 2014-10-25 MED ORDER — MEGESTROL ACETATE 20 MG PO TABS: 20.0000 mg | ORAL_TABLET | ORAL | Status: DC

## 2014-10-25 MED ORDER — OXYCODONE-ACETAMINOPHEN 5-325 MG PO TABS: 1.0000 | ORAL_TABLET | Freq: Four times a day (QID) | ORAL | Status: DC | PRN

## 2014-10-25 NOTE — MAU Note (Signed)
C/o bleeding for 3 months and c/o abdominal pain for a week; saw family doctor- Thornton PapasJeffrey Anderson;

## 2014-10-25 NOTE — Patient Instructions (Signed)
  Laurena BeringVaya se a [facility] a[facility]  Sangrado uterino anormal (Abnormal Uterine Bleeding) El sangrado uterino anormal puede afectar a las mujeres que estn en diversas etapas de la vida, desde adolescentes, mujeres frtiles y Probation officermujeres embarazadas, hasta mujeres que han llegado a la menopausia. Hay diversas clases de sangrado uterino que se consideran anormales, entre ellas:  Prdidas de sangre o Nationwide Mutual Insurancehemorragias entre los perodos.  Hemorragias luego de Sales promotion account executivemantener relaciones sexuales.  Sangrado abundante o ms que lo habitual.  Perodos que duran ms que lo normal.  Sangrado luego de la menopausia. Muchos casos de sangrado uterino anormal son leves y simples de tratar, mientras que otros son ms graves. El mdico debe evaluar cualquier clase de sangrado anormal. El tratamiento depender de la causa del sangrado. INSTRUCCIONES PARA EL CUIDADO EN EL HOGAR Controle su afeccin para ver si hay cambios. Las siguientes indicaciones ayudarn a Architectural technologistaliviar cualquier molestia que pueda sentir:  Evite las duchas vaginales y el uso de tampones segn las indicaciones del mdico.  Cmbiese las compresas con frecuencia. Deber hacerse exmenes plvicos regulares y pruebas de Papanicolaou. Cumpla con todas las visitas de control y Ciscoexmanes diagnsticos, segn le indique su mdico.  SOLICITE ATENCIN MDICA SI:   El sangrado dura ms de 1 semana.  Se siente mareada por momentos. SOLICITE ATENCIN MDICA DE INMEDIATO SI:   Se desmaya.  Debe cambiarse la compresa cada 15 a 30 minutos.  Siente dolor abdominal.  Lance Mussiene fiebre.  Se siente dbil o presenta sudoracin.  Elimina cogulos grandes por la vagina.  Comienza a sentir nuseas y vomita. ASEGRESE DE QUE:   Comprende estas instrucciones.  Controlar su afeccin.  Recibir ayuda de inmediato si no mejora o si empeora. Document Released: 07/13/2005 Document Revised: 07/18/2013 [facility] Patient Information 2015 [employer] UniversityExitCare, [place]LL[place]. This information  is not intended to replace advice given to you by your health care provider. Make sure you discuss any questions you have with your health care provider.

## 2014-10-25 NOTE — MAU Provider Note (Signed)
Chief Complaint: Vaginal Bleeding and Abdominal Pain   First Provider Initiated Contact with Patient 10/25/14 1801      SUBJECTIVE HPI: Shelley Bradley is a 39 y.o. [id number] who presents to maternity admissions reporting on and off vaginal bleeding most days since November.  She often has cramping, but reports onset in last 2 days of severe crampy abdominal pain throughout her entire lower abdomen.  She was seen in MAU 09/28/14 for AUB and treated with Provera, which slowed, but did not stop bleeding.  She reports she desires another pregnancy, but not right now so would be Ok with contraception to manage the bleeding.  She denies vaginal itching/burning, urinary symptoms, h/a, dizziness, n/v, or fever/chills.  She does report some increased fatigue and sensation of tingling in her fingertips occasionally.   5 cm right  Ovarian cyst seen on ultrasound September 2015. She did not follow-up.  Past Medical History  Diagnosis Date  . No pertinent past medical history   . Obese    Past Surgical History  Procedure Laterality Date  . No past surgeries     History   Social History  . Marital Status: Married    Spouse Name: N/A  . Number of Children: N/A  . Years of Education: N/A   Occupational History  . Not on file.   Social History Main Topics  . Smoking status: Never Smoker   . Smokeless tobacco: Never Used  . Alcohol Use: No  . Drug Use: No  . Sexual Activity: Yes   Other Topics Concern  . Not on file   Social History Narrative   No current facility-administered medications on file prior to encounter.   Current Outpatient Prescriptions on File Prior to Encounter  Medication Sig Dispense Refill  . medroxyPROGESTERone (PROVERA) 10 MG tablet Take 1 tablet (10 mg total) by mouth daily. 30 tablet 0   No Known Allergies  Review of Systems  Constitutional: Positive for malaise/fatigue. Negative for fever and chills.  Gastrointestinal: Positive for abdominal pain. Negative  for nausea, vomiting, diarrhea and constipation.  Genitourinary: Negative for dysuria, urgency, frequency, hematuria and flank pain.  Neurological: Positive for tingling. Negative for dizziness and weakness.     OBJECTIVE Blood pressure 137/84, pulse 100, temperature 99.2 F (37.3 C), temperature source Oral, resp. rate 24, last menstrual period 09/19/2014, unknown if currently breastfeeding. GENERAL: Well-developed, well-nourished female in no acute distress.  HEENT: Normocephalic HEART: normal rate RESP: normal effort ABDOMEN: Soft, non-tender EXTREMITIES: Nontender, no edema NEURO: Alert and oriented Pelvic exam: Cervix pink, visually closed, without lesion, scant white creamy discharge, vaginal walls and external genitalia normal Bimanual exam: Cervix 0/long/high, firm, anterior, neg CMT, uterus nontender, nonenlarged, adnexa without tenderness, enlargement, or mass  LAB RESULTS Results for orders placed or performed during the hospital encounter of 10/25/14 (from the past 24 hour(s))  Urinalysis, Routine w reflex microscopic     Status: Abnormal   Collection Time: 10/25/14  4:45 PM  Result Value Ref Range   Color, Urine RED (A) YELLOW   APPearance CLOUDY (A) CLEAR   Specific Gravity, Urine 1.025 1.005 - 1.030   pH 5.0 5.0 - 8.0   Glucose, UA 250 (A) NEGATIVE mg/dL   Hgb urine dipstick LARGE (A) NEGATIVE   Bilirubin Urine NEGATIVE NEGATIVE   Ketones, ur 15 (A) NEGATIVE mg/dL   Protein, ur >454 (A) NEGATIVE mg/dL   Urobilinogen, UA 2.0 (H) 0.0 - 1.0 mg/dL   Nitrite POSITIVE (A) NEGATIVE   Leukocytes, UA  MODERATE (A) NEGATIVE  Urine microscopic-add on     Status: None   Collection Time: 10/25/14  4:45 PM  Result Value Ref Range   Squamous Epithelial / LPF RARE RARE   WBC, UA 0-2 <3 WBC/hpf   RBC / HPF TOO NUMEROUS TO COUNT <3 RBC/hpf   Urine-Other MICROSCOPIC EXAM PERFORMED ON UNCONCENTRATED URINE   Pregnancy, urine POC     Status: None   Collection Time: 10/25/14  5:34  PM  Result Value Ref Range   Preg Test, Ur NEGATIVE NEGATIVE  CBC     Status: Abnormal   Collection Time: 10/25/14  7:43 PM  Result Value Ref Range   WBC 7.0 4.0 - 10.5 K/uL   RBC 4.20 3.87 - 5.11 MIL/uL   Hemoglobin 10.7 (L) 12.0 - 15.0 g/dL   HCT 16.1 (L) 09.6 - 04.5 %   MCV 82.4 78.0 - 100.0 fL   MCH 25.5 (L) 26.0 - 34.0 pg   MCHC 30.9 30.0 - 36.0 g/dL   RDW 40.9 81.1 - 91.4 %   Platelets 269 150 - 400 K/uL    IMAGING US Transvaginal Non-ob  10/25/2014   CLINICAL DATA:  Abnormal uterine bleeding. Pelvic pain. Morbid obesity. LMP 09/19/2014.  EXAM: TRANSABDOMINAL AND TRANSVAGINAL ULTRASOUND OF PELVIS  TECHNIQUE: Both transabdominal and transvaginal ultrasound examinations of the pelvis were performed. Transabdominal technique was performed for global imaging of the pelvis including uterus, ovaries, adnexal regions, and pelvic cul-de-sac. It was necessary to proceed with endovaginal exam following the transabdominal exam to visualize the ovaries, and right adnexal cystic lesion.  COMPARISON:  04/04/2014  FINDINGS: Technically suboptimal exam due to patient habitus/morbid obesity.  Uterus  Measurements: 9.7 x 4.8 x 5.6 cm. No fibroids or other mass visualized.  Endometrium  Thickness: 9 mm.  No focal abnormality visualized.  Right ovary  Measurements: No normal ovary visualized by transabdominal or transvaginal sonography. A simple appearing cystic lesion is seen transabdominally in the right adnexa which measures 7.6 x 5.9 x 5.9 cm. No thickened septations or mural nodules are seen, and this has benign sonographic features although it has increased in size since prior exam when measured 4.9 x 5.3 by 4.6 cm.  Left ovary  Measurements: Not directly visualized by transabdominal or transvaginal sonography, however no adnexal mass identified.  Other findings  No free fluid.  IMPRESSION: 7.6 cm right adnexal cystic lesion which has benign features, but is increased in size since prior study. Benign or  low-grade cystic ovarian neoplasm cannot be excluded. Pelvis MRI without and with contrast is recommended for further evaluation. This recommendation follows the consensus statement: Management of Asymptomatic Ovarian and Other Adnexal Cysts Imaged at [place]: Society of Radiologists in Ultrasound Consensus Conference Statement. Radiology 2010; [phone].  Nonvisualization of left ovary, however no left adnexal mass identified.  No evidence of uterine fibroids. Endometrial thickness measures 9 mm. If bleeding remains unresponsive to hormonal or medical therapy, sonohysterogram should be considered for focal lesion work-up. (Ref: Radiological Reasoning: Algorithmic Workup of Abnormal Vaginal Bleeding with Endovaginal Sonography and Sonohysterography. AJR 2008; [id number]:[id number])   Electronically Signed   By: Myles Rosenthal M.D.   On: 10/25/2014 20:04   US Pelvis Complete  10/25/2014   CLINICAL DATA:  Abnormal uterine bleeding. Pelvic pain. Morbid obesity. LMP 09/19/2014.  EXAM: TRANSABDOMINAL AND TRANSVAGINAL ULTRASOUND OF PELVIS  TECHNIQUE: Both transabdominal and transvaginal ultrasound examinations of the pelvis were performed. Transabdominal technique was performed for global imaging of the pelvis including uterus, ovaries, adnexal  regions, and pelvic cul-de-sac. It was necessary to proceed with endovaginal exam following the transabdominal exam to visualize the ovaries, and right adnexal cystic lesion.  COMPARISON:  04/04/2014  FINDINGS: Technically suboptimal exam due to patient habitus/morbid obesity.  Uterus  Measurements: 9.7 x 4.8 x 5.6 cm. No fibroids or other mass visualized.  Endometrium  Thickness: 9 mm.  No focal abnormality visualized.  Right ovary  Measurements: No normal ovary visualized by transabdominal or transvaginal sonography. A simple appearing cystic lesion is seen transabdominally in the right adnexa which measures 7.6 x 5.9 x 5.9 cm. No thickened septations or mural nodules are seen, and this has  benign sonographic features although it has increased in size since prior exam when measured 4.9 x 5.3 by 4.6 cm.  Left ovary  Measurements: Not directly visualized by transabdominal or transvaginal sonography, however no adnexal mass identified.  Other findings  No free fluid.  IMPRESSION: 7.6 cm right adnexal cystic lesion which has benign features, but is increased in size since prior study. Benign or low-grade cystic ovarian neoplasm cannot be excluded. Pelvis MRI without and with contrast is recommended for further evaluation. This recommendation follows the consensus statement: Management of Asymptomatic Ovarian and Other Adnexal Cysts Imaged at [place]: Society of Radiologists in Ultrasound Consensus Conference Statement. Radiology 2010; [phone].  Nonvisualization of left ovary, however no left adnexal mass identified.  No evidence of uterine fibroids. Endometrial thickness measures 9 mm. If bleeding remains unresponsive to hormonal or medical therapy, sonohysterogram should be considered for focal lesion work-up. (Ref: Radiological Reasoning: Algorithmic Workup of Abnormal Vaginal Bleeding with Endovaginal Sonography and Sonohysterography. AJR 2008; [id number]:W11-91)   Electronically Signed   By: Myles Rosenthal M.D.   On: 10/25/2014 20:04   MAU Management: CBC, Toradol 60 mg IM, Percocet 5/325 x 2 tabs, U/S  Report to [place], CNM. Awaiting ultrasound results. Patient sleeping when CNM returned to room. Reports initial improvement in pain, but worsening of pain again. Now 8/10 on pain scale. Patient is a new apparent distress during conversation. Low suspicion for ovarian torsion.   Ultrasound shows enlargement of right ovarian cyst. Sonographer attempted Doppler studies but was unable to due to patient's body habitus and small amount of ovarian tissue visible around cyst.   Megace and second is Percocet given prior to discharge.  ASSESSMENT 1. Ovarian cyst, right   2. Abnormal uterine bleeding  (AUB)   3. Iron deficiency anemia due to chronic blood loss    PLAN Discharge home in stable condition per consult with Dr. Clearance Coots. CA-125 pending. Torsion and bleeding precautions. Instructed patient to call Dr. Elsie Stain office in the morning to schedule appointment sooner than May. Dr. Gaynell Face can review ultrasound and determine if patient needs MRI. Discussed that it is very important to follow-up in the office regarding enlarging ovarian cyst and ongoing problems with heavy and irregular bleeding. Patient verbalizes understanding. Follow-up Information    Follow up with Kathreen Cosier, MD.   Specialty:  Obstetrics and Gynecology   Why:  Call the office in the morning to schedule appointment as soon as possible.   Contact information:   [address] [place] [address] [phone]       Follow up with [facility] [address] [facility]ERN[facility] ADMISS[facility].   Why:  As needed in emergencies   Contact information:   [address] [id number] mc [place] [address] [phone]        Medication List    STOP  taking these medications        medroxyPROGESTERone 10 MG tablet  Commonly known as:  PROVERA      TAKE these medications        ciprofloxacin 500 MG tablet  Commonly known as:  CIPRO  Take 1 tablet (500 mg total) by mouth 2 (two) times daily.     megestrol 20 MG tablet  Commonly known as:  MEGACE  - Take 1 tablet (20 mg total) by mouth as directed. Take two tablets (40 mg) three times per day time three days,   - then take two tablets (40 mg) two times per day time three days,   - then take two tablets (40 mg)once per day     oxyCODONE-acetaminophen 5-325 MG per tablet  Commonly known as:  PERCOCET/ROXICET  Take 1-2 tablets by mouth every 6 (six) hours as needed.        Lisa LeftSharen Counterwich-Kirby Certified Nurse-Midwife 10/25/2014  8:25 PM   UTI noted after patient left. Rx Cipro sent to pharmacy.  [place]  Kaynan Klonowski, CNM 10/26/2014 3:41 AM

## 2014-10-25 NOTE — MAU Note (Signed)
Pt was seen in MAU a couple of weeks ago for this same problem;

## 2014-10-25 NOTE — MAU Note (Signed)
Pt sent from Dr. Tinnie GensJeffrey Anderson's office because of heavy bleeding & lower abd pain for the past 2 months.

## 2014-10-25 NOTE — Progress Notes (Signed)
[facility] [address], [place] [address] [phone]  Date:  10/25/2014   Name:  Shelley Bradley   DOB:  04/20/1976   MRN:  [id number]  PCP:  No PCP Per Patient    Chief Complaint: Menorrhagia and Abdominal Pain   History of Present Illness:  Shelley Bradley is a 39 y.o. very pleasant female patient who presents with the following:  Seen two weeks ago with vaginal bleeding for four months at that time Given provera Now here with constant bleeding says uses 15-20 pads a day Passing clots No tissue [id number] [facility] "[facility] could be done, everything is normal" No improvement with over the counter medications or other home remedies. Denies other complaint or health concern today.   There are no active problems to display for this patient.   Past Medical History  Diagnosis Date  . No pertinent past medical history   . Obese     Past Surgical History  Procedure Laterality Date  . No past surgeries      History  Substance Use Topics  . Smoking status: Never Smoker   . Smokeless tobacco: Never Used  . Alcohol Use: No    Family History  Problem Relation Age of Onset  . Anesthesia problems Neg Hx   . Hypotension Neg Hx   . Malignant hyperthermia Neg Hx   . Pseudochol deficiency Neg Hx     No Known Allergies  Medication list has been reviewed and updated.  Current Outpatient Prescriptions on File Prior to Visit  Medication Sig Dispense Refill  . medroxyPROGESTERone (PROVERA) 10 MG tablet Take 1 tablet (10 mg total) by mouth daily. (Patient not taking: Reported on 10/25/2014) 30 tablet 0   No current facility-administered medications on file prior to visit.    Review of Systems:  As per HPI, otherwise negative.    Physical Examination: Filed Vitals:   10/25/14 1347  Pulse: 90  Temp: 98.4 F (36.9 C)  Resp: 20   Filed Vitals:   10/25/14 1347  Height: 5' 2.5" (1.588 m)  Weight: 400 lb  (181.439 kg)   Body mass index is 71.95 kg/(m^2). Ideal Body Weight: Weight in (lb) to have BMI = 25: 138.6   GEN: massive obesity, NAD, Non-toxic, Alert & Oriented x 3 HEENT: Atraumatic, Normocephalic.  Ears and Nose: No external deformity. EXTR: No clubbing/cyanosis/edema NEURO: Normal gait.  PSYCH: Normally interactive. Conversant. Not depressed or anxious appearing.  Calm demeanor.    Assessment and Plan: Vaginal bleeding I doubt that she is bleeding as hard as she claims I have referred her back to womens Gurney MaxinMike Mani aided me with translation to insure she understood her instructions  Signed,  Phillips OdorJeffery Kavian Peters, MD

## 2014-10-25 NOTE — Discharge Instructions (Signed)
Sangrado uterino anormal (Abnormal Uterine Bleeding) El sangrado uterino anormal puede afectar a las mujeres que estn en diversas etapas de la vida, desde adolescentes, mujeres frtiles y Probation officer, hasta mujeres que han llegado a la menopausia. Hay diversas clases de sangrado uterino que se consideran anormales, entre ellas:  Prdidas de sangre o [employer] perodos.  Hemorragias luego de Sales promotion account executive.  Sangrado abundante o ms que lo habitual.  Perodos que duran ms que lo normal.  Sangrado luego de la menopausia. Muchos casos de sangrado uterino anormal son leves y simples de tratar, mientras que otros son ms graves. El mdico debe evaluar cualquier clase de sangrado anormal. El tratamiento depender de la causa del sangrado. INSTRUCCIONES PARA EL CUIDADO EN EL HOGAR Controle su afeccin para ver si hay cambios. Las siguientes indicaciones ayudarn a Architectural technologist que pueda sentir:  Evite las duchas vaginales y el uso de tampones segn las indicaciones del mdico.  Cmbiese las compresas con frecuencia. Deber hacerse exmenes plvicos regulares y pruebas de Papanicolaou. Cumpla con todas las visitas de control y [employer] diagnsticos, segn le indique su mdico.  SOLICITE ATENCIN MDICA SI:   El sangrado dura ms de 1 semana.  Se siente mareada por momentos. SOLICITE ATENCIN MDICA DE INMEDIATO SI:   Se desmaya.  Debe cambiarse la compresa cada 15 a 30 minutos.  Siente dolor abdominal.  Lance Muss.  Se siente dbil o presenta sudoracin.  Elimina cogulos grandes por la vagina.  Comienza a sentir nuseas y vomita. ASEGRESE DE QUE:   Comprende estas instrucciones.  Controlar su afeccin.  Recibir ayuda de inmediato si no mejora o si empeora. Document Released: 07/13/2005 Document Revised: 07/18/2013 [facility] Patient Information 2015 [place], [place]. This information is not intended to replace advice given  to you by your health care provider. Make sure you discuss any questions you have with your health care provider.  Quiste ovrico (Ovarian Cyst) Un quiste ovrico es una bolsa llena de lquido que se forma en el ovario. Los ovarios son los rganos pequeos que producen vulos en las mujeres. Se pueden formar varios tipos de [employer]. Katha Hamming no son cancerosos. Muchos de ellos no causan problemas y con frecuencia desaparecen solos. Algunos pueden provocar sntomas y requerir TEFL teacher. Los tipos ms comunes de quistes ovricos son los siguientes:  Quistes funcionales: estos quistes pueden aparecer todos los meses durante el ciclo menstrual. Esto es normal. Estos quistes suelen desaparecer con el prximo ciclo menstrual si la mujer no queda embarazada. En general, los quistes funcionales no tienen sntomas.  Endometriomas: estos quistes se forman a partir del tejido que recubre el tero. Tambin se denominan "quistes de chocolate" porque se llenan de sangre que se vuelve marrn. Este tipo de quiste puede Psychologist, counselling en la zona inferior del abdomen durante la relacin sexual y con el perodo menstrual.  Cistoadenomas: este tipo se desarrolla a partir de las clulas que se [place] en el exterior del ovario. Estos quistes pueden ser muy grandes y causar dolor en la zona inferior del abdomen y durante la relacin sexual. Cleda Clarks tipo de quiste puede girar sobre s mismo, cortar el suministro de Retail buyer y causar un dolor intenso. Tambin se puede romper con facilidad y Physicist, medical.  Quistes dermoides: este tipo de quiste a veces se encuentra en ambos ovarios. Estos quistes pueden [employer] tipos de tejidos del organismo, como piel, dientes, pelo o TEFL teacher. Generalmente no tienen sntomas, a menos que sean [address]  grandes.  Quistes tecalutenicos: aparecen cuando se produce demasiada cantidad de cierta hormona (gonadotropina corinica humana) que estimula en exceso al ovario  para que produzca vulos. Esto es ms frecuente despus de procedimientos que ayudan a la concepcin de un beb (fertilizacin in vitro). CAUSAS   Los medicamentos para la fertilidad pueden provocar una afeccin mediante la cual se forman mltiples quistes de gran tamao en los ovarios. Esta se denomina sndrome de hiperestimulacin ovrica.  El sndrome del ovario poliqustico es una afeccin que puede causar desequilibrios hormonales, los cuales pueden dar como resultado quistes ovricos no funcionales. SIGNOS Y SNTOMAS  Muchos quistes ovricos no causan sntomas. Si se presentan sntomas, stos pueden ser:  Dolor o molestias en la pelvis.  Dolor en la parte baja del abdomen.  Dolor durante las The St. Paul Travelersrelaciones sexuales.  Aumento del permetro abdominal (hinchazn).  Perodos menstruales anormales.  Aumento del The TJX Companiesdolor en los perodos [place] Paradise Parkmenstruales.  Cese de los perodos menstruales sin estar embarazada. DIAGNSTICO  Estos quistes se descubren comnmente durante un examen de rutina o una exploracin ginecolgica anual. Es posible que se ordenen otros estudios para obtener ms informacin sobre el [place] Mallquiste. Estos estudios pueden ser:  Regulatory affairs officercografas.  Radiografas de la pelvis.  Tomografa computada.  Resonancia magntica.  Anlisis de Holly Ridgesangre. TRATAMIENTO  Muchos de los quistes ovricos desaparecen por s solos, sin tratamiento. Es probable que el mdico quiera controlar el quiste regularmente durante 2 o 3meses para ver si se produce algn cambio. En el caso de las mujeres en la menopausia, es particularmente importante controlar de cerca al quiste ya que el ndice de cncer de ovario en las mujeres menopusicas es ms alto. Cuando se requiere TEFL teachertratamiento, este puede incluir cualquiera de los siguientes:  Un procedimiento para drenar el quiste (aspiracin). Esto se puede realizar State Street Corporationmediante el uso de [place]una aguja grande y [place] Lakeuna ecografa. Tambin se puede hacer a travs de un procedimiento  laparoscpico, En este procedimiento, se inserta un tubo delgado que emite luz y que tiene una pequea cmara en un extremo (laparoscopio) a travs de una pequea incisin.  Ciruga para extirpar el quiste completo. Esto se puede realizar mediante una ciruga laparoscpica o Neomia Dearuna ciruga abierta, la cual implica realizar una incisin ms grande en la parte inferior del abdomen.  Tratamiento hormonal o pldoras anticonceptivas. Estos mtodos a veces se usan para ayudar a Baristadisolver un quiste. INSTRUCCIONES PARA EL CUIDADO EN EL HOGAR   Tome solo medicamentos de venta libre o recetados, segn las indicaciones del mdico.  OceanographerConcurra a las consultas de control con su mdico segn las indicaciones.  Hgase exmenes plvicos regulares y pruebas de Papanicolaou. SOLICITE ATENCIN MDICA SI:   Los perodos se atrasan, son irregulares, dolorosos o cesan.  El dolor plvico o abdominal no desaparece.  El abdomen se agranda o se hincha.  Siente presin en la vejiga o no puede vaciarla completamente.  Siente dolor durante las The St. Paul Travelersrelaciones sexuales.  Tiene una sensacin de hinchazn, presin o Environmental managermolestias en el estmago.  Pierde peso sin razn aparente.  Siente un Engineer, maintenance (IT)malestar generalizado.  Est estreida.  Pierde el apetito.  Le aparece acn.  Nota un aumento del vello corporal y facial.  Lenora BoysAumenta de peso sin hacer modificaciones en su actividad fsica y en su dieta habitual.  Sospecha que est embarazada. SOLICITE ATENCIN MDICA DE INMEDIATO SI:   Siente cada vez ms dolor abdominal.  Tiene malestar estomacal (nuseas) y vomita.  Tiene fiebre que se presenta de Newtonmanera repentina.  Siente dolor abdominal al defecar.  Sus perodos menstruales son ms abundantes que lo habitual. ASEGRESE DE QUE:   Comprende estas instrucciones.  Controlar su afeccin.  Recibir ayuda de inmediato si no mejora o si empeora. Document Released: 04/22/2005 Document Revised: 07/18/2013 [facility] Patient  Information 2015 [place], [place]. This information is not intended to replace advice given to you by your health care provider. Make sure you discuss any questions you have with your health care provider.

## 2014-10-25 NOTE — Progress Notes (Signed)
I assisted AdministratorJudy RN with some questions, and I assisted Misty StanleyLisa CNM with questions and I was present during the pelvis exam, by Orlan LeavensViria Alvarez Spanish Interpreter

## 2014-10-26 MED ORDER — CIPROFLOXACIN HCL 500 MG PO TABS: 500.0000 mg | ORAL_TABLET | Freq: Two times a day (BID) | ORAL | Status: DC

## 2014-10-27 LAB — CA 125: CA 125: 11.2 U/mL (ref 0.0–34.0)

## 2014-12-06 ENCOUNTER — Ambulatory Visit (INDEPENDENT_AMBULATORY_CARE_PROVIDER_SITE_OTHER): Payer: Self-pay | Admitting: Emergency Medicine

## 2014-12-06 VITALS — BP 140/90 | HR 109 | Temp 98.4°F | Resp 18 | Ht 62.0 in | Wt >= 6400 oz

## 2014-12-06 DIAGNOSIS — N939 Abnormal uterine and vaginal bleeding, unspecified: Secondary | ICD-10-CM

## 2014-12-06 NOTE — Progress Notes (Signed)
[facility] [address], [place] [address] [phone]  Date:  12/06/2014   Name:  Memory Danceanci BALANZAR LOPEZ   DOB:  03/09/1976   MRN:  [id number]  PCP:  No PCP Per Patient    Chief Complaint: Vaginal Bleeding   History of Present Illness:  [name] is a 39 y.o. very pleasant female patient who presents with the following:  Seen by her GYN yesterday for follow up of heavy vaginal bleeding. She was initially seen here in March for same and hospitalized. She continues to bleed "12-20" pads a day and is requesting a light dutry note Denies other complaint or health concern today.   There are no active problems to display for this patient.   Past Medical History  Diagnosis Date  . No pertinent past medical history   . Obese     Past Surgical History  Procedure Laterality Date  . No past surgeries      History  Substance Use Topics  . Smoking status: Never Smoker   . Smokeless tobacco: Never Used  . Alcohol Use: No    Family History  Problem Relation Age of Onset  . Anesthesia problems Neg Hx   . Hypotension Neg Hx   . Malignant hyperthermia Neg Hx   . Pseudochol deficiency Neg Hx   . Heart disease Mother   . Diabetes Mother   . Diabetes Father     No Known Allergies  Medication list has been reviewed and updated.  Current Outpatient Prescriptions on File Prior to Visit  Medication Sig Dispense Refill  . megestrol (MEGACE) 20 MG tablet Take 1 tablet (20 mg total) by mouth as directed. Take two tablets (40 mg) three times per day time three days,  then take two tablets (40 mg) two times per day time three days,  then take two tablets (40 mg)once per day 60 tablet 1  . oxyCODONE-acetaminophen (PERCOCET/ROXICET) 5-325 MG per tablet Take 1-2 tablets by mouth every 6 (six) hours as needed. (Patient not taking: Reported on 12/06/2014) 30 tablet 0   No current facility-administered medications on file prior to visit.    Review of  Systems:  Review of Systems  Constitutional: Negative for fever, chills and fatigue.  HENT: Negative for congestion, ear pain, hearing loss, postnasal drip, rhinorrhea and sinus pressure.   Eyes: Negative for discharge and redness.  Respiratory: Negative for cough, shortness of breath and wheezing.   Cardiovascular: Negative for chest pain and leg swelling.  Gastrointestinal: Negative for nausea, vomiting, abdominal pain, constipation and blood in stool.  Genitourinary: Negative for dysuria, urgency and frequency.  Musculoskeletal: Negative for neck stiffness.  Skin: Negative for rash.  Neurological: Negative for seizures, weakness and headaches.   Physical Examination: Filed Vitals:   12/06/14 1642  BP: 140/90  Pulse: 109  Temp: 98.4 F (36.9 C)  Resp: 18   Filed Vitals:   12/06/14 1642  Height: 5\' 2"  (1.575 m)  Weight: 408 lb (185.068 kg)   Body mass index is 74.61 kg/(m^2). Ideal Body Weight: Weight in (lb) to have BMI = 25: 136.4   GEN: morbidly obese, NAD, Non-toxic, Alert & Oriented x 3 HEENT: Atraumatic, Normocephalic.  Ears and Nose: No external deformity. EXTR: No clubbing/cyanosis/edema NEURO: Normal gait.  PSYCH: Normally interactive. Conversant. Not depressed or anxious appearing.  Calm demeanor.    Assessment and Plan: 1. Vaginal bleeding Follow up with Dr Gaynell FaceMarshall  2.  Morbid obesity    Signed  Ellison Carwin, MD

## 2015-05-13 ENCOUNTER — Inpatient Hospital Stay (HOSPITAL_COMMUNITY)
Admission: AD | Admit: 2015-05-13 | Discharge: 2015-05-13 | Disposition: A | Discharge status: Home / Self Care | Payer: Self-pay | Source: Ambulatory Visit | Attending: Family Medicine | Admitting: Family Medicine

## 2015-05-13 ENCOUNTER — Encounter (HOSPITAL_COMMUNITY): Payer: Self-pay | Admitting: *Deleted

## 2015-05-13 DIAGNOSIS — Z3202 Encounter for pregnancy test, result negative: Secondary | ICD-10-CM | POA: Insufficient documentation

## 2015-05-13 DIAGNOSIS — W19XXXA Unspecified fall, initial encounter: Secondary | ICD-10-CM | POA: Insufficient documentation

## 2015-05-13 DIAGNOSIS — N912 Amenorrhea, unspecified: Secondary | ICD-10-CM | POA: Insufficient documentation

## 2015-05-13 HISTORY — DX: Morbid (severe) obesity due to excess calories: E66.01

## 2015-05-13 LAB — URINALYSIS, ROUTINE W REFLEX MICROSCOPIC
Bilirubin Urine: NEGATIVE
Glucose, UA: 250 mg/dL — AB
Ketones, ur: NEGATIVE mg/dL
Leukocytes, UA: NEGATIVE
Nitrite: NEGATIVE
Protein, ur: NEGATIVE mg/dL
Specific Gravity, Urine: 1.02 (ref 1.005–1.030)
Urobilinogen, UA: 0.2 mg/dL (ref 0.0–1.0)
pH: 6 (ref 5.0–8.0)

## 2015-05-13 LAB — HCG, QUANTITATIVE, PREGNANCY: hCG, Beta Chain, Quant, S: <1 m[IU]/mL

## 2015-05-13 LAB — URINE MICROSCOPIC-ADD ON

## 2015-05-13 LAB — POCT PREGNANCY, URINE: PREG TEST UR: NEGATIVE

## 2015-05-13 MED ORDER — IBUPROFEN 800 MG PO TABS
800.0000 mg | ORAL_TABLET | Freq: Once | ORAL | Status: AC
  Administered 2015-05-13: 800 mg via ORAL
  Filled 2015-05-13: qty 1

## 2015-05-13 NOTE — MAU Note (Signed)
Pt reports she is having a lot of pain from the top of her abd to her lower abd and it makes it hard to breathe. States she fell this am at 1100 and landed on her hips. Pt reports she had a positive preg test 5 months ago.

## 2015-05-13 NOTE — MAU Provider Note (Signed)
History     CSN: [id number]  Arrival date and time: 05/13/15 1946   First Provider Initiated Contact with Patient 05/13/15 2039      No chief complaint on file.  HPI Comments: Shelley Bradley is a 39 y.o. [id number] at Unknown who presents today after a fall. She states that her LMP was in May, and [facility]. No records available in care everywhere. She states that she fell this evening, and is now having back, side and abdominal pain.  Trauma The incident occurred at home. The injury mechanism was a fall. There is an injury to the abdomen. The pain is moderate. It is unlikely that a foreign body is present. Associated symptoms include abdominal pain. Pertinent negatives include no headaches, nausea or vomiting. There have been no prior injuries to these areas.      Past Medical History  Diagnosis Date  . No pertinent past medical history   . Obese   . Morbid obesity [facility](HCC)     Past Surgical History  Procedure Laterality Date  . No past surgeries      Family History  Problem Relation Age of Onset  . Anesthesia problems Neg Hx   . Hypotension Neg Hx   . Malignant hyperthermia Neg Hx   . Pseudochol deficiency Neg Hx   . Heart disease Mother   . Diabetes Mother   . Diabetes Father     Social History  Substance Use Topics  . Smoking status: Never Smoker   . Smokeless tobacco: Never Used  . Alcohol Use: No    Allergies: No Known Allergies  Prescriptions prior to admission  Medication Sig Dispense Refill Last Dose  . megestrol (MEGACE) 20 MG tablet Take 1 tablet (20 mg total) by mouth as directed. Take two tablets (40 mg) three times per day time three days,  then take two tablets (40 mg) two times per day time three days,  then take two tablets (40 mg)once per day (Patient not taking: Reported on 05/13/2015) 60 tablet 1 Taking  . oxyCODONE-acetaminophen (PERCOCET/ROXICET) 5-325 MG per tablet Take 1-2 tablets by mouth every 6  (six) hours as needed. (Patient not taking: Reported on 12/06/2014) 30 tablet 0 Not Taking at Unknown time    Review of Systems  Constitutional: Negative for fever.  Gastrointestinal: Positive for abdominal pain. Negative for nausea, vomiting, diarrhea and constipation.  Neurological: Negative for dizziness and headaches.   Physical Exam   Blood pressure 124/73, pulse 107, temperature 98.6 F (37 C), temperature source Oral, resp. rate 22, last menstrual period 12/09/2014, SpO2 97 %, unknown if currently breastfeeding.  Physical Exam  Nursing note and vitals reviewed. Constitutional: She is oriented to person, place, and time. She appears well-developed and well-nourished. No distress.  HENT:  Head: Normocephalic.  Cardiovascular: Normal rate.   Respiratory: Effort normal.  GI: Soft. There is no tenderness.  Neurological: She is alert and oriented to person, place, and time.  Skin: Skin is warm and dry.  Psychiatric: She has a normal mood and affect.   Results for orders placed or performed during the hospital encounter of 05/13/15 (from the past 24 hour(s))  Urinalysis, Routine w reflex microscopic ([facility])     Status: Abnormal   Collection Time: 05/13/15  8:25 PM  Result Value Ref Range   Color, Urine YELLOW YELLOW   APPearance CLEAR CLEAR   Specific Gravity, Urine 1.020 1.005 - 1.030   pH 6.0 5.0 -  8.0   Glucose, UA 250 (A) NEGATIVE mg/dL   Hgb urine dipstick MODERATE (A) NEGATIVE   Bilirubin Urine NEGATIVE NEGATIVE   Ketones, ur NEGATIVE NEGATIVE mg/dL   Protein, ur NEGATIVE NEGATIVE mg/dL   Urobilinogen, UA 0.2 0.0 - 1.0 mg/dL   Nitrite NEGATIVE NEGATIVE   Leukocytes, UA NEGATIVE NEGATIVE  Urine microscopic-add on     Status: None   Collection Time: 05/13/15  8:25 PM  Result Value Ref Range   Squamous Epithelial / LPF RARE RARE   WBC, UA 0-2 <3 WBC/hpf   RBC / HPF 3-6 <3 RBC/hpf   Bacteria, UA RARE RARE  Pregnancy, urine POC     Status: None   Collection  Time: 05/13/15  8:30 PM  Result Value Ref Range   Preg Test, Ur NEGATIVE NEGATIVE  hCG, quantitative, pregnancy     Status: None   Collection Time: 05/13/15  8:40 PM  Result Value Ref Range   hCG, Beta Chain, Quant, S <1 <5 mIU/mL     MAU Course  Procedures  MDM Discussed results at length with the patient today. She is very concerned as to why she has not had a period since May. Would like FU in the clinic.  Assessment and Plan   1. Amenorrhea   2. Fall, initial encounter    DC home Comfort measures reviewed  RX: none  Return to MAU as needed   Follow-up Information    Follow up with [facility].   Specialty:  Obstetrics and Gynecology   Why:  They will call you with an appointment    Contact information:   [address] [place] [address] [phone]      Follow up with [facility].   Specialty:  Radiology   Why:  they will call you with an appointment.    Contact information:   [address] [id number] mc [place] [address] [phone]        Tawnya Crook 05/13/2015, 8:40 PM

## 2015-05-13 NOTE — MAU Note (Addendum)
Pt report that she fell at around 11 or 12. Pt has felt abdominal pain like a contraction. Denies leaking or bleeding. Pt is asking for pain medicine.

## 2015-05-13 NOTE — Discharge Instructions (Signed)
Amenorrea secundaria  (Secondary Amenorrhea) La amenorrea secundaria es la detencin del flujo menstrual durante 3-6 meses en una mujer que previamente tena sus perodos. Hay muchas causas posibles: La mayora de las causas no son graves. Generalmente, al tratar el problema subyacente que causa la detencin de la menstruacin, podr volver a tener sus perodos normales. CAUSAS  Algunas causas comunes de la falta de menstruacin son:  Desnutricin.  Bajo nivel de azcar en la sangre (hipoglucemia).  Enfermedad poliqustica de los ovarios.  Estrs o miedos.  Lactancia materna.  Desequilibrio hormonal.  Insuficiencia ovrica.  Medicamentos.  Obesidad extrema.  Fibrosis qustica.  Reduccin de peso drstica por cualquier causa.  Menopausia precoz.  Extirpacin de los ovarios o del tero.  Anticonceptivos.  Enfermedades.  Enfermedades de larga duracin (crnicas).  Sndrome de [place].  Problemas de tiroides.  Pldoras, parches o anillos vaginales para el control de la natalidad. FACTORES DE RIESGO Puede tener ms riesgo de amenorrea secundaria si:  Tiene una historia familiar de este problema.  Sufre un trastorno alimentario.  Realiza entrenamiento deportivo. DIAGNSTICO  Este diagnstico la realiza el mdico por medio de la historia clnica y el examen fsico. Incluir un examen plvico para verificar si hay problemas en los rganos reproductores. Debe descartarse la posibilidad de embarazo. Generalmente se indicarn diferentes anlisis de sangre para medir diferentes tipos de hormonas en el organismo. Le indicarn anlisis de orina. Le harn algunos estudios especializados (ecografas, tomografa computada, resonancia magntica o histeroscopa) y tambin medirn su ndice de masa corporal (IMC). TRATAMIENTO  El tratamiento depende de la causa de la amenorrea. Si hay un trastorno de la alimentacin, deber tratarse con la dieta y la terapia adecuadas. Los  trastornos crnicos pueden mejorar con el tratamiento de la enfermedad. La amenorrea puede corregirse con medicamentos, cambios en el estilo de vida o con ciruga. Si la amenorrea no puede corregirse, algunas veces es posible crear una falsa menstruacin con medicamentos. INSTRUCCIONES PARA EL CUIDADO EN EL HOGAR  Consuma una dieta saludable.  Controle los problemas de peso.  Haga ejercicios con regularidad, pero no excesivamente.  Duerma lo suficiente.  Controle el estrs.  Observe si hay cambios en el ciclo menstrual. Mantenga un registro del momento en que ocurren los perodos. Anote la fecha de inicio de los perodos, cunto duran y si hay problemas. SOLICITE ATENCIN MDICA SI: Los sntomas no mejoran con el tratamiento.   Esta informacin no tiene como fin reemplazar el consejo del mdico. Asegrese de hacerle al mdico cualquier pregunta que tenga.   Document Released: 03/15/2013 Document Revised: 08/03/2014 Elsevier Interactive Patient Education 2016 Elsevier Inc.  

## 2015-05-18 ENCOUNTER — Other Ambulatory Visit (HOSPITAL_COMMUNITY): Payer: Self-pay | Admitting: Advanced Practice Midwife

## 2015-05-18 DIAGNOSIS — N912 Amenorrhea, unspecified: Secondary | ICD-10-CM

## 2015-05-18 DIAGNOSIS — W19XXXA Unspecified fall, initial encounter: Secondary | ICD-10-CM

## 2015-05-20 ENCOUNTER — Ambulatory Visit (HOSPITAL_COMMUNITY)
Admission: RE | Admit: 2015-05-20 | Discharge: 2015-05-20 | Disposition: A | Discharge status: Home / Self Care | Payer: Self-pay | Source: Ambulatory Visit | Attending: Advanced Practice Midwife | Admitting: Advanced Practice Midwife

## 2015-05-20 ENCOUNTER — Telehealth: Payer: Self-pay | Admitting: *Deleted

## 2015-05-20 DIAGNOSIS — R1010 Upper abdominal pain, unspecified: Secondary | ICD-10-CM | POA: Insufficient documentation

## 2015-05-20 DIAGNOSIS — N912 Amenorrhea, unspecified: Secondary | ICD-10-CM | POA: Insufficient documentation

## 2015-05-20 DIAGNOSIS — W19XXXA Unspecified fall, initial encounter: Secondary | ICD-10-CM

## 2015-05-20 DIAGNOSIS — E669 Obesity, unspecified: Secondary | ICD-10-CM | POA: Insufficient documentation

## 2015-05-20 NOTE — Telephone Encounter (Signed)
Received message left on nurse line on 05/20/15 at 1327.  Caller states she has a call report on an ultrasound ordered by Thressa ShellerHeather Hogan, CNM.    Patient has appointment with Dr. Debroah LoopArnold on 05/27/15.  Will send an in basket message to him to review the results and let us know if he needs to see her prior to 05/27/15.

## 2015-05-27 ENCOUNTER — Ambulatory Visit (INDEPENDENT_AMBULATORY_CARE_PROVIDER_SITE_OTHER): Payer: Self-pay | Admitting: Obstetrics & Gynecology

## 2015-05-27 ENCOUNTER — Encounter: Payer: Self-pay | Admitting: Obstetrics & Gynecology

## 2015-05-27 VITALS — BP 124/104 | HR 84 | Temp 99.1°F | Wt >= 6400 oz

## 2015-05-27 DIAGNOSIS — N83201 Unspecified ovarian cyst, right side: Secondary | ICD-10-CM

## 2015-05-27 DIAGNOSIS — N83209 Unspecified ovarian cyst, unspecified side: Secondary | ICD-10-CM | POA: Insufficient documentation

## 2015-05-27 NOTE — Progress Notes (Signed)
Patient ID: [name], female   DOB: 11/09/1975, 39 y.o.   MRN: [id number]  Chief Complaint  Patient presents with  . Follow-up  US shows right adnexal mass  HPI Shelley Bradley is a 39 y.o. female.  [id number] Patient's last menstrual period was 12/09/2014 (exact date).  Persistent right adnexal mass. Complains of dizziness and weakness no pain.  HPI  Past Medical History  Diagnosis Date  . No pertinent past medical history   . Obese   . Morbid obesity [facility](HCC)     Past Surgical History  Procedure Laterality Date  . No past surgeries      Family History  Problem Relation Age of Onset  . Anesthesia problems Neg Hx   . Hypotension Neg Hx   . Malignant hyperthermia Neg Hx   . Pseudochol deficiency Neg Hx   . Heart disease Mother   . Diabetes Mother   . Diabetes Father     Social History Social History  Substance Use Topics  . Smoking status: Never Smoker   . Smokeless tobacco: Never Used  . Alcohol Use: No    No Known Allergies  Current Outpatient Prescriptions  Medication Sig Dispense Refill  . oxyCODONE-acetaminophen (PERCOCET/ROXICET) 5-325 MG per tablet Take 1-2 tablets by mouth every 6 (six) hours as needed. (Patient not taking: Reported on 12/06/2014) 30 tablet 0   No current facility-administered medications for this visit.    Review of Systems Review of Systems  Constitutional: Positive for unexpected weight change (weight gain 35+ lb since 03/2014).  Respiratory: Negative.   Gastrointestinal: Positive for abdominal distention.  Genitourinary: Negative for vaginal bleeding, vaginal discharge and pelvic pain.  Neurological: Positive for dizziness.    Blood pressure 124/104, pulse 84, temperature 99.1 F (37.3 C), temperature source Oral, weight 419 lb 8 oz (190.284 kg), last menstrual period 12/09/2014, not currently breastfeeding.  Physical Exam Physical Exam  Constitutional: She is oriented to person, place, and time. She appears  well-developed. No distress.  obese  Cardiovascular: Normal rate.   Pulmonary/Chest: Effort normal.  Abdominal: Soft. She exhibits no distension and no mass. There is no tenderness.  Neurological: She is alert and oriented to person, place, and time.  Skin: Skin is warm and dry.  Psychiatric: She has a normal mood and affect. Her behavior is normal.  Nursing note and vitals reviewed.   Data Reviewed CLINICAL DATA: Amenorrhea since May 2016. Patient feels movement in site of abdomen and feels her belly is growing. Upper abdominal pain when lying on stomach. Obesity. Gravida 6 para 4 AB 2.  EXAM: TRANSABDOMINAL AND TRANSVAGINAL ULTRASOUND OF PELVIS  TECHNIQUE: Both transabdominal and transvaginal ultrasound examinations of the pelvis were performed. Transabdominal technique was performed for global imaging of the pelvis including uterus, ovaries, adnexal regions, and pelvic cul-de-sac. It was necessary to proceed with endovaginal exam following the transabdominal exam to visualize the endometrium, ovaries.  COMPARISON: 10/25/2014 the multiple prior studies including 02/09/2006  FINDINGS: Uterus  Measurements: 8.7 x 4.9 x 6.2 cm. No fibroids or other mass visualized.  Endometrium  Thickness: 11.6 mm. No focal abnormality visualized.  Right ovary  Measurements: No normal ovarian tissue identified. Within the right adnexal region a cystic lesion contains faint internal echoes suggest anterior wall thickening or debris and measures 8.5 x 6.7 x 8.2 cm. No definite internal blood flow.  Left ovary  Measurements: The ovary is not visualized, either absent or obscured . No adnexal mass identified.  Other findings  Trace free  pelvic fluid identified.  IMPRESSION: 1. Normal appearance of the uterus/endometrium. 2. Right adnexal mass is presumably ovarian. Faint internal echoes suggest mural thickening or debris anteriorly. Since these may be difficult  to assess completely with [place], further evaluation of simple-appearing cysts >7 cm with MRI or surgical evaluation is recommended according to the Society of Radiologists in Ultrasound 2010 Consensus Conference Statement (D Lenis Noon et al. Management of Asymptomatic Ovarian and other Adnexal Cysts Imaged at [place]: Society of Radiologists in Ultrasound Consensus Conference Statement 2010. Radiology 256 (Sept 2010): 943-954.). 3. Nonvisualized left ovary.   Electronically Signed  By: Norva Pavlov M.D.  On: 05/20/2015 12:18 CA 125 11 Assessment    Persistent right adnexal mass nl CA 125 Morbid obesity   Dizziness Irregular menses Plan    Recommend Gyn onc consult re. Surgical vs expectant management. Would not recommend she have surgery at [facility] due to high risk.     Internal medicine evaluation at [facility]   ARNOLD,JAMES 05/27/2015, 2:59 PM

## 2015-05-27 NOTE — Patient Instructions (Addendum)
Quiste ovrico (Ovarian Cyst) Un quiste ovrico es una bolsa llena de lquido que se forma en el ovario. Los ovarios son los rganos pequeos que producen vulos en las mujeres. Se pueden formar varios tipos de quistes en los ovarios. La mayora no son cancerosos. Muchos de ellos no causan problemas y con frecuencia desaparecen solos. Algunos pueden provocar sntomas y requerir tratamiento. Los tipos ms comunes de quistes ovricos son los siguientes:  Quistes funcionales: estos quistes pueden aparecer todos los meses durante el ciclo menstrual. Esto es normal. Estos quistes suelen desaparecer con el prximo ciclo menstrual si la mujer no queda embarazada. En general, los quistes funcionales no tienen sntomas.  Endometriomas: estos quistes se forman a partir del tejido que recubre el tero. Tambin se denominan "quistes de chocolate" porque se llenan de sangre que se vuelve marrn. Este tipo de quiste puede provocar dolor en la zona inferior del abdomen durante la relacin sexual y con el perodo menstrual.  Cistoadenomas: este tipo se desarrolla a partir de las clulas que se ubican en el exterior del ovario. Estos quistes pueden ser muy grandes y causar dolor en la zona inferior del abdomen y durante la relacin sexual. Este tipo de quiste puede girar sobre s mismo, cortar el suministro de sangre y causar un dolor intenso. Tambin se puede romper con facilidad y provocar mucho dolor.  Quistes dermoides: este tipo de quiste a veces se encuentra en ambos ovarios. Estos quistes pueden contener diferentes tipos de tejidos del organismo, como piel, dientes, pelo o cartlago. Generalmente no tienen sntomas, a menos que sean muy grandes.  Quistes tecalutenicos: aparecen cuando se produce demasiada cantidad de cierta hormona (gonadotropina corinica humana) que estimula en exceso al ovario para que produzca vulos. Esto es ms frecuente despus de procedimientos que ayudan a la concepcin de un beb  (fertilizacin in vitro). CAUSAS   Los medicamentos para la fertilidad pueden provocar una afeccin mediante la cual se forman mltiples quistes de gran tamao en los ovarios. Esta se denomina sndrome de hiperestimulacin ovrica.  El sndrome del ovario poliqustico es una afeccin que puede causar desequilibrios hormonales, los cuales pueden dar como resultado quistes ovricos no funcionales. SIGNOS Y SNTOMAS  Muchos quistes ovricos no causan sntomas. Si se presentan sntomas, stos pueden ser:  Dolor o molestias en la pelvis.  Dolor en la parte baja del abdomen.  Dolor durante las relaciones sexuales.  Aumento del permetro abdominal (hinchazn).  Perodos menstruales anormales.  Aumento del dolor en los perodos menstruales.  Cese de los perodos menstruales sin estar embarazada. DIAGNSTICO  Estos quistes se descubren comnmente durante un examen de rutina o una exploracin ginecolgica anual. Es posible que se ordenen otros estudios para obtener ms informacin sobre el quiste. Estos estudios pueden ser:  Ecografas.  Radiografas de la pelvis.  Tomografa computada.  Resonancia magntica.  Anlisis de sangre. TRATAMIENTO  Muchos de los quistes ovricos desaparecen por s solos, sin tratamiento. Es probable que el mdico quiera controlar el quiste regularmente durante 2 o 3meses para ver si se produce algn cambio. En el caso de las mujeres en la menopausia, es particularmente importante controlar de cerca al quiste ya que el ndice de cncer de ovario en las mujeres menopusicas es ms alto. Cuando se requiere tratamiento, este puede incluir cualquiera de los siguientes:  Un procedimiento para drenar el quiste (aspiracin). Esto se puede realizar mediante el uso de una aguja grande y una ecografa. Tambin se puede hacer a travs de un procedimiento laparoscpico, En   este procedimiento, se inserta un tubo delgado que emite luz y que tiene una pequea cmara en un  extremo (laparoscopio) a travs de una pequea incisin.  Ciruga para extirpar el quiste completo. Esto se puede realizar mediante una ciruga laparoscpica o una ciruga abierta, la cual implica realizar una incisin ms grande en la parte inferior del abdomen.  Tratamiento hormonal o pldoras anticonceptivas. Estos mtodos a veces se usan para ayudar a disolver un quiste. INSTRUCCIONES PARA EL CUIDADO EN EL HOGAR   Tome solo medicamentos de venta libre o recetados, segn las indicaciones del mdico.  Concurra a las consultas de control con su mdico segn las indicaciones.  Hgase exmenes plvicos regulares y pruebas de Papanicolaou. SOLICITE ATENCIN MDICA SI:   Los perodos se atrasan, son irregulares, dolorosos o cesan.  El dolor plvico o abdominal no desaparece.  El abdomen se agranda o se hincha.  Siente presin en la vejiga o no puede vaciarla completamente.  Siente dolor durante las relaciones sexuales.  Tiene una sensacin de hinchazn, presin o molestias en el estmago.  Pierde peso sin razn aparente.  Siente un malestar generalizado.  Est estreida.  Pierde el apetito.  Le aparece acn.  Nota un aumento del vello corporal y facial.  Aumenta de peso sin hacer modificaciones en su actividad fsica y en su dieta habitual.  Sospecha que est embarazada. SOLICITE ATENCIN MDICA DE INMEDIATO SI:   Siente cada vez ms dolor abdominal.  Tiene malestar estomacal (nuseas) y vomita.  Tiene fiebre que se presenta de manera repentina.  Siente dolor abdominal al defecar.  Sus perodos menstruales son ms abundantes que lo habitual. ASEGRESE DE QUE:   Comprende estas instrucciones.  Controlar su afeccin.  Recibir ayuda de inmediato si no mejora o si empeora.   Esta informacin no tiene como fin reemplazar el consejo del mdico. Asegrese de hacerle al mdico cualquier pregunta que tenga.   Document Released: 04/22/2005 Document Revised:  07/18/2013 Elsevier Interactive Patient Education 2016 Elsevier Inc.  

## 2015-05-29 ENCOUNTER — Inpatient Hospital Stay (HOSPITAL_COMMUNITY)
Admission: AD | Admit: 2015-05-29 | Discharge: 2015-05-29 | Discharge status: Against Medical Advice | Payer: Self-pay | Source: Ambulatory Visit | Attending: Obstetrics and Gynecology | Admitting: Obstetrics and Gynecology

## 2015-05-29 DIAGNOSIS — Z5320 Procedure and treatment not carried out because of patient's decision for unspecified reasons: Secondary | ICD-10-CM | POA: Insufficient documentation

## 2015-05-29 NOTE — Progress Notes (Signed)
Patient left hospital from lobby prior to being seen. Did not tell anyone she was leaving, did not sign AMA form.

## 2015-05-29 NOTE — MAU Note (Signed)
Pt presents to MAU with complaints of abdominal pain since yesterday with a fever and vomiting. Denies any vaginal bleeding or abnormal discharge.

## 2015-10-24 ENCOUNTER — Emergency Department (HOSPITAL_COMMUNITY)
Admission: EM | Admit: 2015-10-24 | Discharge: 2015-10-25 | Disposition: A | Discharge status: Home / Self Care | Payer: Self-pay | Attending: Emergency Medicine | Admitting: Emergency Medicine

## 2015-10-24 ENCOUNTER — Emergency Department (HOSPITAL_COMMUNITY): Payer: Self-pay

## 2015-10-24 ENCOUNTER — Encounter (HOSPITAL_COMMUNITY): Payer: Self-pay | Admitting: *Deleted

## 2015-10-24 DIAGNOSIS — Z3202 Encounter for pregnancy test, result negative: Secondary | ICD-10-CM | POA: Insufficient documentation

## 2015-10-24 DIAGNOSIS — R0600 Dyspnea, unspecified: Secondary | ICD-10-CM | POA: Insufficient documentation

## 2015-10-24 DIAGNOSIS — R079 Chest pain, unspecified: Secondary | ICD-10-CM | POA: Insufficient documentation

## 2015-10-24 LAB — I-STAT ARTERIAL BLOOD GAS, ED
ACID-BASE EXCESS: 5 mmol/L — AB (ref 0.0–2.0)
Bicarbonate: 30.7 mEq/L — ABNORMAL HIGH (ref 20.0–24.0)
O2 SAT: 96 %
PH ART: 7.411 (ref 7.350–7.450)
PO2 ART: 80 mmHg (ref 80.0–100.0)
Patient temperature: 98.6
TCO2: 32 mmol/L (ref 0–100)
pCO2 arterial: 48.3 mmHg — ABNORMAL HIGH (ref 35.0–45.0)

## 2015-10-24 LAB — COMPREHENSIVE METABOLIC PANEL
ALT: 25 U/L (ref 14–54)
ANION GAP: 8 (ref 5–15)
AST: 27 U/L (ref 15–41)
Albumin: 3.2 g/dL — ABNORMAL LOW (ref 3.5–5.0)
Alkaline Phosphatase: 76 U/L (ref 38–126)
BUN: 9 mg/dL (ref 6–20)
CO2: 29 mmol/L (ref 22–32)
Calcium: 8.9 mg/dL (ref 8.9–10.3)
Chloride: 99 mmol/L — ABNORMAL LOW (ref 101–111)
Creatinine, Ser: 0.6 mg/dL (ref 0.44–1.00)
Glucose, Bld: 114 mg/dL — ABNORMAL HIGH (ref 65–99)
POTASSIUM: 4.2 mmol/L (ref 3.5–5.1)
Sodium: 136 mmol/L (ref 135–145)
TOTAL PROTEIN: 7.5 g/dL (ref 6.5–8.1)
Total Bilirubin: 0.4 mg/dL (ref 0.3–1.2)

## 2015-10-24 LAB — CBC WITH DIFFERENTIAL/PLATELET
Basophils Absolute: 0 10*3/uL (ref 0.0–0.1)
Basophils Relative: 0 %
EOS PCT: 2 %
Eosinophils Absolute: 0.2 10*3/uL (ref 0.0–0.7)
HCT: 38.3 % (ref 36.0–46.0)
HEMOGLOBIN: 11.4 g/dL — AB (ref 12.0–15.0)
LYMPHS PCT: 30 %
Lymphs Abs: 2.3 10*3/uL (ref 0.7–4.0)
MCH: 24.1 pg — AB (ref 26.0–34.0)
MCHC: 29.8 g/dL — ABNORMAL LOW (ref 30.0–36.0)
MCV: 80.8 fL (ref 78.0–100.0)
Monocytes Absolute: 0.4 10*3/uL (ref 0.1–1.0)
Monocytes Relative: 6 %
Neutro Abs: 4.7 10*3/uL (ref 1.7–7.7)
Neutrophils Relative %: 62 %
Platelets: 281 10*3/uL (ref 150–400)
RBC: 4.74 MIL/uL (ref 3.87–5.11)
RDW: 16.3 % — ABNORMAL HIGH (ref 11.5–15.5)
WBC: 7.5 10*3/uL (ref 4.0–10.5)

## 2015-10-24 LAB — I-STAT BETA HCG BLOOD, ED (MC, WL, AP ONLY)

## 2015-10-24 LAB — D-DIMER, QUANTITATIVE (NOT AT ARMC): D DIMER QUANT: 0.59 ug{FEU}/mL — AB (ref 0.00–0.50)

## 2015-10-24 LAB — I-STAT TROPONIN, ED: Troponin i, poc: 0.01 ng/mL (ref 0.00–0.08)

## 2015-10-24 NOTE — ED Provider Notes (Addendum)
CSN: [id number]     Arrival date & time 10/24/15  2102 History   First MD Initiated Contact with Patient 10/24/15 2107     Chief Complaint  Patient presents with  . Chest Pain     (Consider location/radiation/quality/duration/timing/severity/associated sxs/prior Treatment) HPI   Level V caveat secondary to a language barrier This is a 40 year old morbidly obese female who presents today complaining of sudden onset of sharp left-sided chest pain with associated dyspnea. She reports that she had gotten an argument with her son. She went to the grocery store when she had the sudden onset of pain and a near-syncopal episode. She is continuing to have shortness of breath and chest pain. She denies any similar symptoms in the past.  Past Medical History  Diagnosis Date  . No pertinent past medical history   . Obese   . Morbid obesity [facility])    Past Surgical History  Procedure Laterality Date  . No past surgeries     Family History  Problem Relation Age of Onset  . Anesthesia problems Neg Hx   . Hypotension Neg Hx   . Malignant hyperthermia Neg Hx   . Pseudochol deficiency Neg Hx   . Heart disease Mother   . Diabetes Mother   . Diabetes Father    Social History  Substance Use Topics  . Smoking status: Never Smoker   . Smokeless tobacco: Never Used  . Alcohol Use: No   OB History    Gravida Para Term Preterm AB TAB SAB Ectopic Multiple Living   0 2 0 2 0 0 4     Review of Systems  All other systems reviewed and are negative.     Allergies  Review of patient's allergies indicates no known allergies.  Home Medications   Prior to Admission medications   Not on File   BP 113/86 mmHg  Pulse 94  Temp(Src) 98.2 F (36.8 C) (Oral)  Resp 26  SpO2 98%  LMP  Physical Exam  ED Course  Procedures (including critical care time) Labs Review Labs Reviewed  CBC WITH DIFFERENTIAL/PLATELET - Abnormal; Notable for the following:    Hemoglobin 11.4 (*)    MCH 24.1  (*)    MCHC 29.8 (*)    RDW 16.3 (*)    All other components within normal limits  COMPREHENSIVE METABOLIC PANEL - Abnormal; Notable for the following:    Chloride 99 (*)    Glucose, Bld 114 (*)    Albumin 3.2 (*)    All other components within normal limits  D-DIMER, QUANTITATIVE (NOT AT [facility]) - Abnormal; Notable for the following:    D-Dimer, Quant 0.59 (*)    All other components within normal limits  I-STAT ARTERIAL BLOOD GAS, ED - Abnormal; Notable for the following:    pCO2 arterial 48.3 (*)    Bicarbonate 30.7 (*)    Acid-Base Excess 5.0 (*)    All other components within normal limits  BLOOD GAS, ARTERIAL  I-STAT TROPOININ, ED  I-STAT BETA HCG BLOOD, ED ([facility], [facility], [facility] ONLY)    Imaging Review Dg Chest 2 View  10/24/2015  CLINICAL DATA:  Initial evaluation for acute dyspnea, chest pain. EXAM: CHEST  2 VIEW COMPARISON:  None. FINDINGS: Mild cardiomegaly.  Mediastinal silhouette within normal limits. Lungs are mildly hypoinflated. Mild diffuse pulmonary vascular congestion without overt pulmonary edema. No consolidative airspace disease. No definite pleural effusion. No pneumothorax. No acute osseus abnormality. IMPRESSION: Mild cardiomegaly with diffuse pulmonary vascular  congestion without overt pulmonary edema. Electronically Signed   By: Rise MuBenjamin  McClintock M.D.   On: 10/24/2015 22:48   I have personally reviewed and evaluated these images and lab results as part of my medical decision-making.   EKG Interpretation   Date/Time:  Thursday October 24 2015 21:05:02 EDT Ventricular Rate:  95 PR Interval:  146 QRS Duration: 86 QT Interval:  334 QTC Calculation: 420 R Axis:   82 Text Interpretation:  Sinus rhythm Low voltage, precordial leads  Borderline T wave abnormalities Confirmed by Jamilynn Whitacre MD, Duwayne HeckANIELLE ([id number]) on  10/24/2015 11:37:05 PM      MDM   Final diagnoses:  Dyspnea  Chest pain, unspecified chest pain type    40 year old morbidly obese female with chest pain  and dyspnea tonight. Workup is normal if the exception of an elevated d-dimer. A CT angiogram of her chest is pending. A repeat troponin has been ordered. I discussed the patient's care with Dr. Judd Lienelo and she is to be discharged if these tests are normal.    Margarita Grizzleanielle Eulalio Reamy, MD 10/25/15 [id number]   Margarita Grizzleanielle Shae Augello, MD 10/25/15 [phone]

## 2015-10-24 NOTE — ED Notes (Signed)
Pt states that she had an argument with her son in the grocery store and she started experiencing sharp chest pain. EMS reports that pt went home and had a possible syncopal episode and called 911. EMS reports pt was conscious but crying and upset when they got there. Vital signs were stable. EKG normal. EMS gave 324 ASA. This RN and Dr Rosalia Hammersay spoke with pt on interpreter phone to assess pt. Pt states she is still experiencing shortness of breath and chest pain.

## 2015-10-25 ENCOUNTER — Emergency Department (HOSPITAL_COMMUNITY): Payer: Self-pay

## 2015-10-25 LAB — I-STAT TROPONIN, ED: TROPONIN I, POC: 0 ng/mL (ref 0.00–0.08)

## 2015-10-25 MED ORDER — IOHEXOL 350 MG/ML SOLN
100.0000 mL | Freq: Once | INTRAVENOUS | Status: AC | PRN
  Administered 2015-10-25: 100 mL via INTRAVENOUS

## 2015-10-25 MED ORDER — LORAZEPAM 2 MG/ML IJ SOLN
1.0000 mg | Freq: Once | INTRAMUSCULAR | Status: AC
  Administered 2015-10-25: 1 mg via INTRAVENOUS
  Filled 2015-10-25: qty 1

## 2015-10-25 NOTE — ED Notes (Signed)
Pt began snoring respirations, sats in 70's/80's after IV ativan. MD aware and at bedside. Pt placed on non-rebreather. Sats up to 100%. Pt responsive to verbal stimuli.

## 2015-10-25 NOTE — Discharge Instructions (Signed)

## 2015-10-25 NOTE — ED Notes (Signed)
Patient transported to CT 

## 2016-03-31 ENCOUNTER — Inpatient Hospital Stay (HOSPITAL_COMMUNITY): Payer: Self-pay

## 2016-03-31 ENCOUNTER — Encounter (HOSPITAL_COMMUNITY): Payer: Self-pay | Admitting: *Deleted

## 2016-03-31 ENCOUNTER — Other Ambulatory Visit: Payer: Self-pay

## 2016-03-31 ENCOUNTER — Inpatient Hospital Stay (HOSPITAL_COMMUNITY)
Admission: AD | Admit: 2016-03-31 | Discharge: 2016-04-04 | DRG: 205 | Disposition: A | Discharge status: Home / Self Care | Payer: Self-pay | Source: Ambulatory Visit | Attending: Internal Medicine | Admitting: Internal Medicine

## 2016-03-31 DIAGNOSIS — R0602 Shortness of breath: Secondary | ICD-10-CM

## 2016-03-31 DIAGNOSIS — Z6841 Body Mass Index (BMI) 40.0 and over, adult: Secondary | ICD-10-CM

## 2016-03-31 DIAGNOSIS — J9622 Acute and chronic respiratory failure with hypercapnia: Secondary | ICD-10-CM | POA: Diagnosis present

## 2016-03-31 DIAGNOSIS — E662 Morbid (severe) obesity with alveolar hypoventilation: Principal | ICD-10-CM | POA: Diagnosis present

## 2016-03-31 DIAGNOSIS — R51 Headache: Secondary | ICD-10-CM | POA: Diagnosis present

## 2016-03-31 DIAGNOSIS — R519 Headache, unspecified: Secondary | ICD-10-CM

## 2016-03-31 DIAGNOSIS — Z8249 Family history of ischemic heart disease and other diseases of the circulatory system: Secondary | ICD-10-CM

## 2016-03-31 DIAGNOSIS — N644 Mastodynia: Secondary | ICD-10-CM | POA: Diagnosis present

## 2016-03-31 DIAGNOSIS — J9601 Acute respiratory failure with hypoxia: Secondary | ICD-10-CM | POA: Diagnosis present

## 2016-03-31 DIAGNOSIS — R000 Tachycardia, unspecified: Secondary | ICD-10-CM | POA: Diagnosis present

## 2016-03-31 DIAGNOSIS — R0600 Dyspnea, unspecified: Secondary | ICD-10-CM

## 2016-03-31 DIAGNOSIS — G4710 Hypersomnia, unspecified: Secondary | ICD-10-CM | POA: Diagnosis present

## 2016-03-31 DIAGNOSIS — D649 Anemia, unspecified: Secondary | ICD-10-CM | POA: Diagnosis present

## 2016-03-31 DIAGNOSIS — Z833 Family history of diabetes mellitus: Secondary | ICD-10-CM

## 2016-03-31 DIAGNOSIS — J9621 Acute and chronic respiratory failure with hypoxia: Secondary | ICD-10-CM | POA: Diagnosis present

## 2016-03-31 DIAGNOSIS — R600 Localized edema: Secondary | ICD-10-CM | POA: Diagnosis present

## 2016-03-31 LAB — COMPREHENSIVE METABOLIC PANEL
ALK PHOS: 72 U/L (ref 38–126)
ALT: 31 U/L (ref 14–54)
AST: 29 U/L (ref 15–41)
Albumin: 3.4 g/dL — ABNORMAL LOW (ref 3.5–5.0)
Anion gap: 7 (ref 5–15)
BILIRUBIN TOTAL: 0.6 mg/dL (ref 0.3–1.2)
BUN: 12 mg/dL (ref 6–20)
CALCIUM: 8.8 mg/dL — AB (ref 8.9–10.3)
CO2: 30 mmol/L (ref 22–32)
CREATININE: 0.51 mg/dL (ref 0.44–1.00)
Chloride: 100 mmol/L — ABNORMAL LOW (ref 101–111)
Glucose, Bld: 91 mg/dL (ref 65–99)
Potassium: 3.7 mmol/L (ref 3.5–5.1)
Sodium: 137 mmol/L (ref 135–145)
TOTAL PROTEIN: 7.7 g/dL (ref 6.5–8.1)

## 2016-03-31 LAB — CBC WITH DIFFERENTIAL/PLATELET
BASOS PCT: 0 %
Basophils Absolute: 0 10*3/uL (ref 0.0–0.1)
EOS PCT: 4 %
Eosinophils Absolute: 0.3 10*3/uL (ref 0.0–0.7)
HEMATOCRIT: 35.6 % — AB (ref 36.0–46.0)
HEMOGLOBIN: 10.9 g/dL — AB (ref 12.0–15.0)
LYMPHS ABS: 2.1 10*3/uL (ref 0.7–4.0)
Lymphocytes Relative: 27 %
MCH: 24.3 pg — AB (ref 26.0–34.0)
MCHC: 30.6 g/dL (ref 30.0–36.0)
MCV: 79.5 fL (ref 78.0–100.0)
Monocytes Absolute: 0.5 10*3/uL (ref 0.1–1.0)
Monocytes Relative: 6 %
NEUTROS PCT: 63 %
Neutro Abs: 4.8 10*3/uL (ref 1.7–7.7)
Platelets: 260 10*3/uL (ref 150–400)
RBC: 4.48 MIL/uL (ref 3.87–5.11)
RDW: 16.7 % — AB (ref 11.5–15.5)
WBC: 7.7 10*3/uL (ref 4.0–10.5)

## 2016-03-31 LAB — BRAIN NATRIURETIC PEPTIDE: B Natriuretic Peptide: 19.3 pg/mL (ref 0.0–100.0)

## 2016-03-31 LAB — URINALYSIS, ROUTINE W REFLEX MICROSCOPIC
BILIRUBIN URINE: NEGATIVE
Glucose, UA: NEGATIVE mg/dL
Ketones, ur: NEGATIVE mg/dL
Leukocytes, UA: NEGATIVE
NITRITE: NEGATIVE
PROTEIN: NEGATIVE mg/dL
SPECIFIC GRAVITY, URINE: 1.025 (ref 1.005–1.030)
pH: 6 (ref 5.0–8.0)

## 2016-03-31 LAB — URINE MICROSCOPIC-ADD ON

## 2016-03-31 LAB — I-STAT TROPONIN, ED: Troponin i, poc: 0 ng/mL (ref 0.00–0.08)

## 2016-03-31 LAB — POCT PREGNANCY, URINE: PREG TEST UR: NEGATIVE

## 2016-03-31 LAB — D-DIMER, QUANTITATIVE: D-Dimer, Quant: 0.94 ug/mL-FEU — ABNORMAL HIGH (ref 0.00–0.50)

## 2016-03-31 MED ORDER — KETOROLAC TROMETHAMINE 15 MG/ML IJ SOLN
30.0000 mg | Freq: Once | INTRAMUSCULAR | Status: AC
  Administered 2016-03-31: 30 mg via INTRAVENOUS
  Filled 2016-03-31: qty 2

## 2016-03-31 MED ORDER — BUTALBITAL-APAP-CAFFEINE 50-325-40 MG PO TABS
2.0000 | ORAL_TABLET | Freq: Four times a day (QID) | ORAL | Status: DC | PRN
  Administered 2016-03-31: 2 via ORAL
  Filled 2016-03-31: qty 2

## 2016-03-31 MED ORDER — ENOXAPARIN SODIUM 150 MG/ML ~~LOC~~ SOLN
215.0000 mg | SUBCUTANEOUS | Status: AC
  Administered 2016-04-01: 215 mg via SUBCUTANEOUS
  Filled 2016-03-31: qty 1.43

## 2016-03-31 MED ORDER — METOCLOPRAMIDE HCL 5 MG/ML IJ SOLN
10.0000 mg | Freq: Once | INTRAMUSCULAR | Status: AC
  Administered 2016-03-31: 10 mg via INTRAVENOUS
  Filled 2016-03-31: qty 2

## 2016-03-31 NOTE — Progress Notes (Signed)
Thinks maybe depressed but never diagnosed. Does not want to harm self or others.

## 2016-03-31 NOTE — ED Notes (Signed)
IV attempt x 1 not successful- Matt Q RN requested to attempt

## 2016-03-31 NOTE — ED Notes (Signed)
Bed: WEMS01 Expected date:  Expected time:  Means of arrival:  Comments: Triage: 40 yo F head injury

## 2016-03-31 NOTE — Progress Notes (Signed)
EDCM spoke to patient at bedside. Patient confirms she [place] a pcp [place]  [facility], informed patient of services there and walk in times.  EDCM also provided patient with list of pcps who accept self pay patients, list of discount pharmacies and websites needymeds.org and GoodRX.com for medication assistance, phone number to inquire about the orange card, phone number to inquire about Medicaid, phone number to inquire about the Affordable Care Act, financial resources in the community such as local churches, [employer], urban ministries, and dental assistance for uninsured patients.  This information was placed at bedside as RN at bedside performing a procedure.   No further EDCM needs at this time.

## 2016-03-31 NOTE — ED Notes (Signed)
Bed: [id number] Expected date:  Expected time:  Means of arrival:  Comments: [facility]

## 2016-03-31 NOTE — ED Notes (Signed)
Patient transported to X-ray 

## 2016-03-31 NOTE — ED Triage Notes (Signed)
[facility] due to breast pain, shortness of breath, decreased oxygen saturation while sleeping. [facility] is concerned pt has sleep apnea.

## 2016-03-31 NOTE — ED Provider Notes (Signed)
[facility]-EMERGENCY DEPT Provider Note   CSN: [id number] Arrival date & time: 03/31/16  1129     History   Chief Complaint Chief Complaint  Patient presents with  . Breast Pain    HPI Nanci Paisli Silfies is a 40 y.o. female.  The history is provided by the patient. No language interpreter was used.   Nanci Danisha Brassfield is a 40 y.o. female who presents to the Emergency Department complaining of chest pain, headache.  She presents as a transfer for [facility] for evaluation of headache and left-sided breast pain. Symptoms have been present for the last 3 days and her waxing and waning in nature. The headache is described as sharp in the chest pain is described as pinching in the left breast. She reports 2 days of vomiting and diarrhea.  She endorses associated fatigue, shortness of breath, leg swelling and bruising to her left breast that resolved after homeopathic treatments. She has no known medical problems. She has a family history of breast cancer is concerned she may have breast cancer. Past Medical History:  Diagnosis Date  . Morbid obesity (HCC)   . No pertinent past medical history   . Obese   . Sleep apnea in adult     Patient Active Problem List   Diagnosis Date Noted  . Ovarian cyst 05/27/2015  . Morbidly obese (HCC) 12/06/2014    Past Surgical History:  Procedure Laterality Date  . NO PAST SURGERIES      OB History    Gravida Para Term Preterm AB Living   6 4 4  0 2 4   SAB TAB Ectopic Multiple Live Births   2 0 0 0 2       Home Medications    Prior to Admission medications   Medication Sig Start Date End Date Taking? Authorizing Provider  ibuprofen (ADVIL,MOTRIN) 200 MG tablet Take 800 mg by mouth every 6 (six) hours as needed.   Yes Historical Provider, MD    Family History Family History  Problem Relation Age of Onset  . Heart disease Mother   . Diabetes Mother   . Diabetes Father   . Anesthesia problems Neg Hx   . Hypotension Neg Hx   .  Malignant hyperthermia Neg Hx   . Pseudochol deficiency Neg Hx     Social History Social History  Substance Use Topics  . Smoking status: Never Smoker  . Smokeless tobacco: Never Used  . Alcohol use No     Allergies   Review of patient's allergies indicates no known allergies.   Review of Systems Review of Systems  All other systems reviewed and are negative.    Physical Exam Updated Vital Signs BP 126/73   Pulse 98   Temp 98 F (36.7 C)   Resp (!) 28   Ht 5\' 2"  (1.575 m)   Wt (!) 469 lb (212.7 kg)   LMP 03/10/2016   SpO2 96%   BMI 85.78 kg/m   Physical Exam  Constitutional: She is oriented to person, place, and time. She appears well-developed.  Morbidly obese  HENT:  Head: Normocephalic and atraumatic.  Cardiovascular: Normal rate and regular rhythm.   No murmur heard. Pulmonary/Chest: Effort normal. No respiratory distress.  Distant heart sounds. Significant tenderness to palpation across the left upper chest and left mid axillary region without any deformities.  Abdominal: Soft. There is no tenderness. There is no rebound and no guarding.  Musculoskeletal: She exhibits no tenderness.  Trace pitting edema to bilateral  lower extremities  Neurological: She is alert and oriented to person, place, and time.  Skin: Skin is warm and dry. No rash noted.  Psychiatric: She has a normal mood and affect. Her behavior is normal.  Nursing note and vitals reviewed.    ED Treatments / Results  Labs (all labs ordered are listed, but only abnormal results are displayed) Labs Reviewed  URINALYSIS, ROUTINE W REFLEX MICROSCOPIC (NOT AT [facility]) - Abnormal; Notable for the following:       Result Value   Hgb urine dipstick LARGE (*)    All other components within normal limits  URINE MICROSCOPIC-ADD ON - Abnormal; Notable for the following:    Squamous Epithelial / LPF 6-30 (*)    Bacteria, UA FEW (*)    All other components within normal limits  COMPREHENSIVE METABOLIC  PANEL  CBC WITH DIFFERENTIAL/PLATELET  BRAIN NATRIURETIC PEPTIDE  POCT PREGNANCY, URINE  I-STAT TROPOININ, ED    EKG  EKG Interpretation None       Radiology No results found.  Procedures Procedures (including critical care time)  Medications Ordered in ED Medications  butalbital-acetaminophen-caffeine (FIORICET, ESGIC) 50-325-40 MG per tablet 2 tablet (2 tablets Oral Given 03/31/16 1441)  metoCLOPramide (REGLAN) injection 10 mg (not administered)  ketorolac (TORADOL) 15 MG/ML injection 30 mg (not administered)     Initial Impression / Assessment and Plan / ED Course  I have reviewed the triage vital signs and the nursing notes.  Pertinent labs & imaging results that were available during my care of the patient were reviewed by me and considered in my medical decision making (see chart for details).  Clinical Course    Patient with morbid obesity [facility] of headache, chest pain, shortness of breath, leg pain. She has a nonfocal neurologic examination. She is to On exam with intermittent hypoxia, requiring 2 L nasal cannula. Plan to admit to the hospitalist service given her tachypnea with intermittent hypoxia, PE study pending. She will also need to the right lower extremity DVT study given her right lower extremity pain and swelling.  Headache resolved after treatment in the department. Presentation is not consistent with subarachnoid hemorrhage or meningitis. Final Clinical Impressions(s) / ED Diagnoses   Final diagnoses:  Headache, unspecified headache type  Shortness of breath    New Prescriptions New Prescriptions   No medications on file     Tilden FossaElizabeth Ebone Alcivar, MD 04/01/16 [phone]

## 2016-03-31 NOTE — MAU Note (Addendum)
Pt C/O L upper & outer breast pain x 3 days.  States she is not pregnant or breastfeeding.  Feels like her hands are numb & tingling, also has some SOB.  Pt has also had a constant HA for 3 days that she rates a 10.

## 2016-03-31 NOTE — MAU Provider Note (Signed)
History     CSN: [id number]  Arrival date and time: 03/31/16 1129   None     Chief Complaint  Patient presents with  . Breast Pain   Non-pregnant female c/o HA and left breast pain. Reports frontal HA that started about 4 days ago. She tried Ibuprofen with little to no effect. She reports eating and drinking well. No recent injuries or head trauma. The breast pain started about 3 days ago. She felt a sharp pain in the breast and has been sore since. She reports feeling 3 small lumps at the same time. She reports skin erythema at times. She denies nipple discharge and retraction. She also reports a hx of SOB at night that is worsening over the last 3 days and occurring intermittently during the day as well. LMP 03/10/16.   Past Medical History:  Diagnosis Date  . Morbid obesity (HCC)   . No pertinent past medical history   . Obese   . Sleep apnea in adult     Past Surgical History:  Procedure Laterality Date  . NO PAST SURGERIES      Family History  Problem Relation Age of Onset  . Heart disease Mother   . Diabetes Mother   . Diabetes Father   . Anesthesia problems Neg Hx   . Hypotension Neg Hx   . Malignant hyperthermia Neg Hx   . Pseudochol deficiency Neg Hx     Social History  Substance Use Topics  . Smoking status: Never Smoker  . Smokeless tobacco: Never Used  . Alcohol use No    Allergies: No Known Allergies  Prescriptions Prior to Admission  Medication Sig Dispense Refill Last Dose  . ibuprofen (ADVIL,MOTRIN) 200 MG tablet Take 800 mg by mouth every 6 (six) hours as needed.   03/30/2016 at 2200    Review of Systems  Constitutional: Negative.   Respiratory: Positive for shortness of breath (at night, not new onset).   Cardiovascular: Negative.   Gastrointestinal: Negative.   Skin: Negative.   Neurological: Negative.    Physical Exam   Blood pressure 140/75, pulse 108, temperature 98.1 F (36.7 C), temperature source Oral, resp. rate 20, height 5\' 2"   (1.575 m), weight (!) 212.7 kg (469 lb), last menstrual period 03/10/2016, SpO2 95 %.  Physical Exam  Constitutional: She is oriented to person, place, and time. She appears well-developed.  Non-toxic appearance. She does not have a sickly appearance. She does not appear ill. No distress (morbidly obese).  HENT:  Head: Normocephalic and atraumatic.  Neck: Normal range of motion. Neck supple.  Cardiovascular: Normal rate and regular rhythm.   Respiratory: Effort normal and breath sounds normal. No respiratory distress. She has no wheezes. She has no rales. She exhibits no tenderness. Right breast exhibits no inverted nipple, no mass, no nipple discharge, no skin change and no tenderness. Left breast exhibits no inverted nipple, no mass, no nipple discharge, no skin change and no tenderness. Breasts are symmetrical.    Musculoskeletal: Normal range of motion.  Neurological: She is alert and oriented to person, place, and time.  Skin: Skin is warm and dry.  Psychiatric: She has a normal mood and affect.   Results for orders placed or performed during the hospital encounter of 03/31/16 (from the past 24 hour(s))  Urinalysis, Routine w reflex microscopic (not at [facility])     Status: Abnormal   Collection Time: 03/31/16 11:53 AM  Result Value Ref Range   Color, Urine YELLOW YELLOW  APPearance CLEAR CLEAR   Specific Gravity, Urine 1.025 1.005 - 1.030   pH 6.0 5.0 - 8.0   Glucose, UA NEGATIVE NEGATIVE mg/dL   Hgb urine dipstick LARGE (A) NEGATIVE   Bilirubin Urine NEGATIVE NEGATIVE   Ketones, ur NEGATIVE NEGATIVE mg/dL   Protein, ur NEGATIVE NEGATIVE mg/dL   Nitrite NEGATIVE NEGATIVE   Leukocytes, UA NEGATIVE NEGATIVE  Urine microscopic-add on     Status: Abnormal   Collection Time: 03/31/16 11:53 AM  Result Value Ref Range   Squamous Epithelial / LPF 6-30 (A) NONE SEEN   WBC, UA 0-5 0 - 5 WBC/hpf   RBC / HPF 6-30 0 - 5 RBC/hpf   Bacteria, UA FEW (A) NONE SEEN  Pregnancy, urine POC      Status: None   Collection Time: 03/31/16 12:49 PM  Result Value Ref Range   Preg Test, Ur NEGATIVE NEGATIVE    MAU Course  Procedures Fioricet 2 tab po x1  MDM O2 saturations remain mostly 95% or greater however decrease into 80s noted when pt found drowsy by RN with spontaneous improvement once aroused. Improvement of HA after Fiorocet but may need further workup since new onset. Recommend further workup for worsening SOB and ?sleep apnea as well as HA. Discuss[facility] pt presentation and exam [facility] Will [facility] via [facility]. Will order outpt imaging for left breast pain at [facility], [facility] coordinator Martie Lee(Sabrina) notified and will contact pt.   Assessment and Plan   1. Headache, unspecified headache type   2. Shortness of breath    Transfer to [facility] on 05/19/16 at 9:30 Follow up with [facility] in 1-2 weeks ([facility]CP to call pt)  Donette LarryMelanie Hendricks Schwandt, CNM 03/31/2016, 2:20 PM

## 2016-03-31 NOTE — ED Notes (Addendum)
When pt falls asleep, 02 sats decrease to 87. 2LNC APPLIED FOR SUPPORT. Pt alert and active with care.

## 2016-03-31 NOTE — ED Notes (Signed)
RN at bedside drawing labs and placing IV.

## 2016-03-31 NOTE — MAU Note (Signed)
Pt sitting high fowlers. Drops off to sleep and pulse ox drops 70s-80s. If pt is aroused, pulse ox up to 95-98 and stays there while talking

## 2016-03-31 NOTE — ED Notes (Signed)
RN sts she will attempt to collect labs from IV.  Pt had to have a an ultrasound IV placed.

## 2016-04-01 ENCOUNTER — Observation Stay (HOSPITAL_COMMUNITY): Payer: Self-pay

## 2016-04-01 ENCOUNTER — Encounter (HOSPITAL_COMMUNITY): Payer: Self-pay

## 2016-04-01 ENCOUNTER — Observation Stay (HOSPITAL_BASED_OUTPATIENT_CLINIC_OR_DEPARTMENT_OTHER): Payer: Self-pay

## 2016-04-01 ENCOUNTER — Telehealth (HOSPITAL_COMMUNITY): Payer: Self-pay | Admitting: *Deleted

## 2016-04-01 ENCOUNTER — Inpatient Hospital Stay (HOSPITAL_COMMUNITY): Payer: Self-pay

## 2016-04-01 ENCOUNTER — Other Ambulatory Visit (HOSPITAL_COMMUNITY): Payer: Self-pay | Admitting: *Deleted

## 2016-04-01 DIAGNOSIS — R0602 Shortness of breath: Secondary | ICD-10-CM

## 2016-04-01 DIAGNOSIS — N644 Mastodynia: Secondary | ICD-10-CM

## 2016-04-01 DIAGNOSIS — R0600 Dyspnea, unspecified: Secondary | ICD-10-CM

## 2016-04-01 DIAGNOSIS — R609 Edema, unspecified: Secondary | ICD-10-CM

## 2016-04-01 DIAGNOSIS — N6452 Nipple discharge: Secondary | ICD-10-CM

## 2016-04-01 DIAGNOSIS — J9601 Acute respiratory failure with hypoxia: Secondary | ICD-10-CM

## 2016-04-01 LAB — ECHOCARDIOGRAM COMPLETE
HEIGHTINCHES: 62 in
Weight: 7432.15 oz

## 2016-04-01 LAB — TROPONIN I
Troponin I: <0.03 ng/mL (ref <0.03)
Troponin I: <0.03 ng/mL (ref <0.03)

## 2016-04-01 LAB — COMPREHENSIVE METABOLIC PANEL
ALBUMIN: 3.7 g/dL (ref 3.5–5.0)
ALK PHOS: 80 U/L (ref 38–126)
ALT: 35 U/L (ref 14–54)
AST: 36 U/L (ref 15–41)
Anion gap: 8 (ref 5–15)
BILIRUBIN TOTAL: 0.7 mg/dL (ref 0.3–1.2)
BUN: 13 mg/dL (ref 6–20)
CALCIUM: 8.7 mg/dL — AB (ref 8.9–10.3)
CO2: 31 mmol/L (ref 22–32)
Chloride: 99 mmol/L — ABNORMAL LOW (ref 101–111)
Creatinine, Ser: 0.65 mg/dL (ref 0.44–1.00)
GFR calc Af Amer: >60 mL/min (ref >60)
GFR calc non Af Amer: >60 mL/min (ref >60)
GLUCOSE: 154 mg/dL — AB (ref 65–99)
Potassium: 4.1 mmol/L (ref 3.5–5.1)
SODIUM: 138 mmol/L (ref 135–145)
TOTAL PROTEIN: 8.3 g/dL — AB (ref 6.5–8.1)

## 2016-04-01 LAB — CBC
HEMATOCRIT: 37.6 % (ref 36.0–46.0)
HEMOGLOBIN: 11.5 g/dL — AB (ref 12.0–15.0)
MCH: 24.5 pg — ABNORMAL LOW (ref 26.0–34.0)
MCHC: 30.6 g/dL (ref 30.0–36.0)
MCV: 80.2 fL (ref 78.0–100.0)
Platelets: 303 10*3/uL (ref 150–400)
RBC: 4.69 MIL/uL (ref 3.87–5.11)
RDW: 16.9 % — ABNORMAL HIGH (ref 11.5–15.5)
WBC: 7.5 10*3/uL (ref 4.0–10.5)

## 2016-04-01 LAB — TSH: TSH: 3.273 u[IU]/mL (ref 0.350–4.500)

## 2016-04-01 MED ORDER — ACETAMINOPHEN 650 MG RE SUPP: 650.0000 mg | Freq: Four times a day (QID) | RECTAL | Status: DC | PRN

## 2016-04-01 MED ORDER — ONDANSETRON HCL 4 MG/2ML IJ SOLN
4.0000 mg | Freq: Four times a day (QID) | INTRAMUSCULAR | Status: DC | PRN
  Administered 2016-04-01 – 2016-04-04 (×2): 4 mg via INTRAVENOUS
  Filled 2016-04-01 (×2): qty 2

## 2016-04-01 MED ORDER — IOPAMIDOL (ISOVUE-370) INJECTION 76%
100.0000 mL | Freq: Once | INTRAVENOUS | Status: AC | PRN
Start: 2016-04-01 — End: 2016-04-01
  Administered 2016-04-01: 100 mL via INTRAVENOUS

## 2016-04-01 MED ORDER — FUROSEMIDE 10 MG/ML IJ SOLN
40.0000 mg | Freq: Once | INTRAMUSCULAR | Status: AC
  Administered 2016-04-01: 40 mg via INTRAVENOUS
  Filled 2016-04-01: qty 4

## 2016-04-01 MED ORDER — ACETAMINOPHEN 325 MG PO TABS
650.0000 mg | ORAL_TABLET | Freq: Four times a day (QID) | ORAL | Status: DC | PRN
  Administered 2016-04-01 – 2016-04-03 (×4): 650 mg via ORAL
  Filled 2016-04-01 (×4): qty 2

## 2016-04-01 MED ORDER — PERFLUTREN LIPID MICROSPHERE
INTRAVENOUS | Status: AC
  Filled 2016-04-01: qty 10

## 2016-04-01 MED ORDER — PERFLUTREN LIPID MICROSPHERE
1.0000 mL | INTRAVENOUS | Status: AC | PRN
  Administered 2016-04-01: 2 mL via INTRAVENOUS

## 2016-04-01 MED ORDER — ONDANSETRON HCL 4 MG PO TABS
4.0000 mg | ORAL_TABLET | Freq: Four times a day (QID) | ORAL | Status: DC | PRN
  Administered 2016-04-03: 4 mg via ORAL
  Filled 2016-04-01: qty 1

## 2016-04-01 MED ORDER — ENOXAPARIN SODIUM 150 MG/ML ~~LOC~~ SOLN
210.0000 mg | Freq: Two times a day (BID) | SUBCUTANEOUS | Status: DC
  Administered 2016-04-01 – 2016-04-04 (×7): 210 mg via SUBCUTANEOUS
  Filled 2016-04-01 (×8): qty 1.4

## 2016-04-01 MED ORDER — IOPAMIDOL (ISOVUE-370) INJECTION 76%: 100.0000 mL | Freq: Once | INTRAVENOUS | Status: DC | PRN

## 2016-04-01 NOTE — H&P (Signed)
History and Physical    Shelley Bradley WUJ:[id number] DOB: 12/26/1975 DOA: 03/31/2016  PCP: No PCP Per Patient  Patient coming from: Home.  Chief Complaint: Left breast pain and shortness of breath.  Engineer, structuralpanish translator used.  HPI: Shelley Bradley is a 40 y.o. female with morbid obesity had originally presented to the ER at [facility]. Patient's complaint was left breast pain. Patient has been having this pain for last few days. Denies any fever chills trauma or injuries nipple discharge. While in the ER patient was found to be hypoxic and [facility]. Patient's d-dimer was elevated. Patient also has not is increasing lower extremity edema. Patient states over the last 3 days patient has been having exertional shortness of breath. Patient also has symptoms of orthopnea. Patient is being admitted for acute respiratory failure with hypoxia. Patient is not in distress.  ED Course: I have ordered Lasix 40 mg IV 1 dose.  Review of Systems: As per HPI, rest all negative.   Past Medical History:  Diagnosis Date  . Morbid obesity (HCC)   . No pertinent past medical history   . Obese   . Sleep apnea in adult     Past Surgical History:  Procedure Laterality Date  . NO PAST SURGERIES       reports that she has never smoked. She has never used smokeless tobacco. She reports that she does not drink alcohol or use drugs.  No Known Allergies  Family History  Problem Relation Age of Onset  . Heart disease Mother   . Diabetes Mother   . Diabetes Father   . Anesthesia problems Neg Hx   . Hypotension Neg Hx   . Malignant hyperthermia Neg Hx   . Pseudochol deficiency Neg Hx     Prior to Admission medications   Medication Sig Start Date End Date Taking? Authorizing Provider  ibuprofen (ADVIL,MOTRIN) 200 MG tablet Take 800 mg by mouth every 6 (six) hours as needed.   Yes Historical Provider, MD    Physical Exam: Vitals:   03/31/16 2130  03/31/16 2145 03/31/16 2334 04/01/16 0001  BP:   140/75   Pulse: 101 96 92   Resp: (!) 33 (!) 31 24   Temp:      TempSrc:      SpO2: 100% 100% 96%   Weight:    (!) 464 lb 9 oz (210.7 kg)  Height:          Constitutional: Not in distress. Vitals:   03/31/16 2130 03/31/16 2145 03/31/16 2334 04/01/16 0001  BP:   140/75   Pulse: 101 96 92   Resp: (!) 33 (!) 31 24   Temp:      TempSrc:      SpO2: 100% 100% 96%   Weight:    (!) 464 lb 9 oz (210.7 kg)  Height:       Eyes: Anicteric no pallor. ENMT: No discharge from the ears eyes nose or mouth. Neck: No mass felt. No JVD appreciated. Respiratory: No rhonchi or crepitations. Cardiovascular: S1 and S2 heard. Abdomen: Soft nontender bowel sounds present. Musculoskeletal: Bilateral lower extremity edema. Skin: No rash. Neurologic: Alert awake oriented to time place and person. Moves all extremities. Psychiatric: Appears normal.   Labs on Admission: I have personally reviewed following labs and imaging studies  CBC:  Recent Labs Lab 03/31/16 1920  WBC 7.7  NEUTROABS 4.8  HGB 10.9*  HCT 35.6*  MCV 79.5  PLT 260  Basic Metabolic Panel:  Recent Labs Lab 03/31/16 1920  NA 137  K 3.7  CL 100*  CO2 30  GLUCOSE 91  BUN 12  CREATININE 0.51  CALCIUM 8.8*   GFR: Estimated Creatinine Clearance: 173.7 mL/min (by C-G formula based on SCr of 0.8 mg/dL). Liver Function Tests:  Recent Labs Lab 03/31/16 1920  AST 29  ALT 31  ALKPHOS 72  BILITOT 0.6  PROT 7.7  ALBUMIN 3.4*   No results for input(s): LIPASE, AMYLASE in the last 168 hours. No results for input(s): AMMONIA in the last 168 hours. Coagulation Profile: No results for input(s): INR, PROTIME in the last 168 hours. Cardiac Enzymes: No results for input(s): CKTOTAL, CKMB, CKMBINDEX, TROPONINI in the last 168 hours. BNP (last 3 results) No results for input(s): PROBNP in the last 8760 hours. HbA1C: No results for input(s): HGBA1C in the last 72  hours. CBG: No results for input(s): GLUCAP in the last 168 hours. Lipid Profile: No results for input(s): CHOL, HDL, LDLCALC, TRIG, CHOLHDL, LDLDIRECT in the last 72 hours. Thyroid Function Tests: No results for input(s): TSH, T4TOTAL, FREET4, T3FREE, THYROIDAB in the last 72 hours. Anemia Panel: No results for input(s): VITAMINB12, FOLATE, FERRITIN, TIBC, IRON, RETICCTPCT in the last 72 hours. Urine analysis:    Component Value Date/Time   COLORURINE YELLOW 03/31/2016 1153   APPEARANCEUR CLEAR 03/31/2016 1153   LABSPEC 1.025 03/31/2016 1153   PHURINE 6.0 03/31/2016 1153   GLUCOSEU NEGATIVE 03/31/2016 1153   HGBUR LARGE (A) 03/31/2016 1153   BILIRUBINUR NEGATIVE 03/31/2016 1153   KETONESUR NEGATIVE 03/31/2016 1153   PROTEINUR NEGATIVE 03/31/2016 1153   UROBILINOGEN 0.2 05/13/2015 2025   NITRITE NEGATIVE 03/31/2016 1153   LEUKOCYTESUR NEGATIVE 03/31/2016 1153   Sepsis Labs: @LABRCNTIP (procalcitonin:4,lacticidven:4) )No results found for this or any previous visit (from the past 240 hour(s)).   Radiological Exams on Admission: Dg Chest 2 View  Result Date: 03/31/2016 CLINICAL DATA:  40 year old female with chest pain and shortness of breath. EXAM: CHEST  2 VIEW COMPARISON:  Chest CT dated 10/25/2015 FINDINGS: Two views of the chest demonstrate clear lungs with sharp costophrenic angles. There is no pneumothorax. Stable mild cardiomegaly. No acute osseous pathology. IMPRESSION: No active cardiopulmonary disease. Electronically Signed   By: Elgie Collard M.D.   On: 03/31/2016 18:26    EKG: Independently reviewed. Normal sinus rhythm with nonspecific T-wave changes. Low voltage.  Assessment/Plan Principal Problem:   Acute respiratory failure with hypoxia (HCC)    1. Acute respiratory failure with hypoxia - patient is not in distress. I have ordered 1 dose of Lasix 40 mg IV since patient was hypoxic and has lower extremity edema. Since patient's d-dimer is elevated CT angiogram  of the chest will be attempted and if not possible to be done then VQ scan and Dopplers of the lower extremity will be done. Check 2-D echo and cycle cardiac markers. Patient has been already placed on full dose Lovenox for PE by the ER physician. 2. Left breast pain - on exam does not have any skin changes or discharge. Does not look infected. Will need outpatient referral to gynecologist. 3. Morbid obesity - will probably need counseling for bariatric surgery.   DVT prophylaxis: Lovenox. Code Status: Full code.  Family Communication: Patient's husband at the bedside.  Disposition Plan: Home.  Consults called: None.  Admission status: Observation.    Eduard Clos MD [facility] Pager [phone].  If 7PM-7AM, please contact night-coverage [email or web address] Password [email or web address]  04/01/2016, 1:32  AM

## 2016-04-01 NOTE — Consult Note (Signed)
Name: Memory Danceanci Balanzar Lopez MRN: [id number] DOB: 01/25/1976    ADMISSION DATE:  03/31/2016 CONSULTATION DATE:  Dana Allan9/6  REFERRING MD :  Catha GosselinMikhail  CHIEF COMPLAINT:  Hypoxia and ? possible PE  BRIEF PATIENT DESCRIPTION:  MOF (456lbs) admitted 9/5 w/ breast discomfort, dyspnea and hypoxia. Imaging for PE was "non-diagnostic". PCCM asked to see 9/6 for recs re: anticoagulation in this setting as well as to a/w out-pt follow up for probable OSA/OHS  SIGNIFICANT EVENTS   STUDIES:  Echo 9/6>>> LE US 9/6: no DVT noted however study was inconclusive due to body habitus CT chest 9/6: non-diagnostic for PE due to body habitus and respiratory rate. Heart enlarged. No pleural fluid or consolidation.  Wells score: 4.5 (intermittent prob)  HISTORY OF PRESENT ILLNESS:   This is a 464 lb female who initially presented to ER on  9/5 w/ 3d h/o w/ CC left breast pain, HA and fever. Given family h/o breast CA. She wanted to be evaluated.  Also reported exertional dyspnea, some substernal chest discomfort w/ deep breath and increased orthopnea . On evaluation in ER found to be hypoxic and found to drop O2 sats to 87% even on 2 liters [place] when she fell asleep, therefore she was admitted for further evaluation. She was placed on therapeutic LMWH for possible PE, and given lasix for presumed volume overload. CT chest and US of lower extremities were non-diagnostic due to body habitus. PCCM was asked to see re: help determine need for on-going anticoagulation as well as possibly assist w/ out pt management of hypoxia.   PAST MEDICAL HISTORY :   has a past medical history of Morbid obesity (HCC); No pertinent past medical history; Obese; and Sleep apnea in adult.  has a past surgical history that includes No past surgeries. Prior to Admission medications   Medication Sig Start Date End Date Taking? Authorizing Provider  ibuprofen (ADVIL,MOTRIN) 200 MG tablet Take 800 mg by mouth every 6 (six) hours as needed.   Yes  Historical Provider, MD   No Known Allergies  FAMILY HISTORY:  family history includes Diabetes in her father and mother; Heart disease in her mother. SOCIAL HISTORY:  reports that she has never smoked. She has never used smokeless tobacco. She reports that she does not drink alcohol or use drugs.  REVIEW OF SYSTEMS limited by language barrier Constitutional: + fever (now resolved), chills, weight loss, malaise fatigue and diaphoresis.  HENT: Negative for hearing loss, ear pain, nosebleeds, congestion, sore throat, neck pain, tinnitus and ear discharge.   Eyes: Negative for blurred vision, double vision, photophobia, pain, discharge and redness.  Respiratory: Negative for cough, hemoptysis, sputum production, shortness of breath, wheezing and stridor.  Chest pain w/ deep breath over sternum  Cardiovascular: Negative for chest pain, palpitations, orthopnea, claudication, leg swelling and PND.  Gastrointestinal: Negative for heartburn, nausea, vomiting, abdominal pain, diarrhea, constipation, blood in stool and melena.  Genitourinary: Negative for dysuria, urgency, frequency, hematuria and flank pain. Left breast pain to palp  Musculoskeletal: Negative for myalgias, back pain, joint pain and falls.  Skin: Negative for itching and rash.  Neurological: Negative for dizziness, tingling, tremors, sensory change, speech change, focal weakness, seizures, loss of consciousness, weakness and headaches.  Endo/Heme/Allergies: Negative for environmental allergies and polydipsia. Does not bruise/bleed easily.  SUBJECTIVE:  No acute distress.  VITAL SIGNS: Temp:  [98 F (36.7 C)-98.8 F (37.1 C)] 98.8 F (37.1 C) (09/06 0431) Pulse Rate:  [92-109] 103 (09/06 0431) Resp:  [20-33] 30 (  09/06 0431) BP: (108-158)/(53-97) 108/53 (09/06 0431) SpO2:  [86 %-100 %] 97 % (09/06 0431) Weight:  [464 lb 8.2 oz (210.7 kg)-464 lb 9 oz (210.7 kg)] 464 lb 8.2 oz (210.7 kg) (09/06 0249)  PHYSICAL  EXAMINATION: General:  Obese Hispanic female. Sitting up in chair  Neuro:  Awake, oriented. Speaks limited english HEENT:  MMM, neck large can't appreciate JVD  Cardiovascular:  Distant RRR Lungs:  Decreased t/o, no accessory use  Abdomen:  Massively obese + bowel sounds Musculoskeletal:  Equal st and bulk  Skin:  Warm and dry +LE edema   Recent Labs Lab 03/31/16 1920 04/01/16 0735  NA 137 138  K 3.7 4.1  CL 100* 99*  CO2 30 31  BUN 12 13  CREATININE 0.51 0.65  GLUCOSE 91 154*   ABG    Component Value Date/Time   PHART 7.411 10/24/2015 2207   PCO2ART 48.3 (H) 10/24/2015 2207   PO2ART 80.0 10/24/2015 2207   HCO3 30.7 (H) 10/24/2015 2207   TCO2 32 10/24/2015 2207   O2SAT 96.0 10/24/2015 2207    Recent Labs Lab 03/31/16 1920 04/01/16 0735  HGB 10.9* 11.5*  HCT 35.6* 37.6  WBC 7.7 7.5  PLT 260 303   Dg Chest 2 View  Result Date: 03/31/2016 CLINICAL DATA:  40 year old female with chest pain and shortness of breath. EXAM: CHEST  2 VIEW COMPARISON:  Chest CT dated 10/25/2015 FINDINGS: Two views of the chest demonstrate clear lungs with sharp costophrenic angles. There is no pneumothorax. Stable mild cardiomegaly. No acute osseous pathology. IMPRESSION: No active cardiopulmonary disease. Electronically Signed   By: Elgie Collard M.D.   On: 03/31/2016 18:26   Ct Angio Chest Pe W And/or Wo Contrast  Result Date: 04/01/2016 CLINICAL DATA:  Left breast pain, hypoxia, elevated D-dimer, increasing lower extremity edema. Exertional shortness of breath. EXAM: CT ANGIOGRAPHY CHEST WITH CONTRAST TECHNIQUE: Multidetector CT imaging of the chest was performed using the standard protocol during bolus administration of intravenous contrast. Multiplanar CT image reconstructions and MIPs were obtained to evaluate the vascular anatomy. CONTRAST:  100 cc Isovue 370. COMPARISON:  10/25/2015. FINDINGS: Cardiovascular: Image quality is significantly degraded by body habitus and respiratory  motion, rendering the examination nondiagnostic for pulmonary embolus. Heart is enlarged. No pericardial effusion. Mediastinum/Nodes: No definite pathologically enlarged mediastinal, hilar or axillary lymph nodes. Lungs/Pleura: Image quality is markedly degraded by body habitus and severe respiratory motion. No large areas of consolidation. No pleural fluid. Upper Abdomen: Image quality is markedly degraded by body habitus. Liver appears fatty. Musculoskeletal: Image quality is markedly degraded by body habitus. Grossly unremarkable. Review of the MIP images confirms the above findings. IMPRESSION: 1. Examination is severely limited by large body habitus and high respiratory rate, rendering the study nondiagnostic for pulmonary embolus. 2. Liver appears fatty. Electronically Signed   By: Leanna Battles M.D.   On: 04/01/2016 10:16    ASSESSMENT / PLAN:  Acute on Chronic Hypoxic and hypercarbic respiratory failure Possible pulmonary edema Chest pain OSA and probable OHS  Discussion This is a massively obese female. Currently resting on [place] up in chair. -Hard to r/o PE at this point. Calculated wells score 4.5 would make her intermittent probability. VQ scan may assist in further eval as would ECHO (both pending). She has un-treated OSA and likely OSH. An alternative explanation for her acute dyspnea would be: untreated OSA-->hypoxia (Chronic)-->PAH and decompensated cor pulmonale and diastolic dysfxn leading to pulmonary edema.    Plan Change CPAP to BIPAP  for now Cont lasix  F/u NM perfusion scan Cont LMWH (therpeutic dosing for now) F/u echo .   Breast discomfort.  Plan Per primary team    Simonne Martinet ACNP-BC [facility] Pager # [phone] OR # [phone] if no answer   04/01/2016, 2:49 PM   ATTENDING NOTE / ATTESTATION NOTE :   I have discussed the case with the resident/APP Anders Simmonds.   I agree with the resident/APP's  history, physical examination,  assessment, and plans.    I have edited the above note and modified it according to our agreed history, physical examination, assessment and plan.   Patient only spoke Spanish and I used the video interpreter with Katherin [phone].  Briefly, 67 female, morbidly obese, no significant medical issues, gained 150 pounds the last year, comes in with symptoms of chest pain, fevers, dyspnea, vomiting. There was concern about pulmonary embolism so she ended up getting a chest CTA which was inconclusive. The chest CTA was suboptimal because of her weight. Chest CTA did not reveal any filling defects.  Patient has snoring, hypersomnia, witnessed apneas, gasping, choking. Has gained 150 pounds last year. She ends up eating almost all day. She wakes up unrefreshed. Naps daily. She usually sleeps at 2 in the morning and wakes up at 6 in the morning. No issues falling asleep but does frequent arousals. Hypersomnia has affected her functionality. 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  No history of cancer, blood clot, lung problems. Fairly healthy.  Physical examination above. Morbidly obese. Comfortable laying on a chair. Last vitals 157/80, heart rate 90, respiratory 20, sats of 100% on room air. Airway is class IV. Short stout neck. Diminished breath sounds bilaterally. Some crackles at the bases. No wheezing. Large pannus. Grade 2 edema. Rest of exam as above.  Labs reviewed. Creatinine was normal. Troponin was normal. BNP was 19. Chest CT scan with suboptimal images, atelectasis, no gross filling defects.  Assessment : 73 female with morbid obesity, likely with severe obstructive sleep apnea, obesity hypoventilation syndrome, diastolic dysfunction, possible right heart strain/cor pulmonale, pulm HTN 2/2 above.  There is also concern for pulmonary embolism given her recent symptoms of dyspnea.   Plan: 1. I extensively discussed with her complications of  untreated sleep apnea. She has young children to take care of and she did not realize until now the severity of her disease. She is not sure if she has medical insurance. She is married and her husband has Aeronautical engineer. I told her she should have medical insurance. If she has issues getting insurance, she should let us know so we can coordinate with Child psychotherapist. She needs to have a sleep study ASAP once dischargeD. We need to schedule this on day of discharge. SHE needs to be placed on a cancellation list so we can do it within the next week or so.  2. I extensively discussed with her the BiPAP and why she needs it. She only tried it for 3 minutes so far today and was told she had to stop using it. I would suggest continue using BiPAP at bedtime. On discharge, she will not be able to get CPAP or BiPAP until she gets sleep study. This needs to be expedited.  3. Regarding pulmonary embolism, she is certainly at risk given her deconditioning and her sedentary state especially the last year when she gained 150 pounds. Agree with getting VQ scan and 2-D  echo. If she has moderate probability of blood clot, I would keep her on blood thinners. Then again, I'm not sure if she will understand the risks of taking blood thinners and I'm not sure if she will be following up as instructed. I'm also not sure if she has medical insurance. If she does not have medical insurance, then we cannot order anticoagulation on her as an outpt.   4. I reiterated the fact that she needs to follow-up with [place] as an outpatient.   5. No family members around.   Pollie Meyer, MD 04/01/2016, 6:05 PM [facility] Pager [phone] After 3 pm or if no answer, call [phone]

## 2016-04-01 NOTE — Telephone Encounter (Signed)
Telephoned patient at home number and unable to leave message due to voicemail box not set up

## 2016-04-01 NOTE — Progress Notes (Signed)
ANTICOAGULATION CONSULT NOTE - Initial Consult  Pharmacy Consult for Enoxaparin Indication: VTE Treatment  No Known Allergies  Patient Measurements: Height: 5\' 2"  (157.5 cm) Weight: (!) 464 lb 8.2 oz (210.7 kg) IBW/kg (Calculated) : 50.1 Heparin Dosing Weight:   Vital Signs: Temp: 98.8 F (37.1 C) (09/06 0431) Temp Source: Oral (09/06 0431) BP: 108/53 (09/06 0431) Pulse Rate: 103 (09/06 0431)  Labs:  Recent Labs  03/31/16 1920 04/01/16 0205  HGB 10.9*  --   HCT 35.6*  --   PLT 260  --   CREATININE 0.51  --   TROPONINI  --  <0.03    Estimated Creatinine Clearance: 173.7 mL/min (by C-G formula based on SCr of 0.8 mg/dL).   Medical History: Past Medical History:  Diagnosis Date  . Morbid obesity (HCC)   . No pertinent past medical history   . Obese   . Sleep apnea in adult     Medications:  Scheduled:    Assessment: Patient with SOB and + D-Dimer.  Enoxaparin ordered per pharmacy for VTE Treatment.  First dose already given in ED Goal of Therapy:  Anti-Xa level 0.6-1 units/ml 4hrs after LMWH dose given Monitor platelets by anticoagulation protocol: Yes   Plan:  Enoxaparin 210mg  sq q12hr Plan to check Anti-Xa level, to assure dose is effective, 4hr after 3rd dose of enoxaparin.  Darlina GuysGrimsley Jr, Jacquenette ShoneJulian Crowford 04/01/2016,5:10 AM

## 2016-04-01 NOTE — Progress Notes (Signed)
PROGRESS NOTE    Memory Shelley Bradley  ZOX:[id number] DOB: 10/09/1975 DOA: 03/31/2016 PCP: No PCP Per Patient   Chief Complaint  Patient presents with  . Breast Pain     Brief Narrative:  HPI on 04/01/2016 by Dr. Midge MiniumArshad Kakrakandy  Shelley Bradley is a 40 y.o. female with morbid obesity had originally [facility]. Patient's complaint was left breast pain. Patient has been having this pain for last few days. Denies any fever chills trauma or injuries nipple discharge. While in the ER patient was found to be hypoxic and [facility]. Patient's d-dimer was elevated. Patient also has not is increasing lower extremity edema. Patient states over the last 3 days patient has been having exertional shortness of breath. Patient also has symptoms of orthopnea. Patient is being admitted for acute respiratory failure with hypoxia. Patient is not in distress.  Assessment & Plan   Acute hypoxic respiratory with hypoxia -?due to PE -Elevated ddimer 0.94 -Patient With SPO2 of 86% upon admission. Tachycardic, tachypneic. -CTA chest: examination is severely limited by large body habitus and high respiratory rate, rendering the study nondiagnostic for pulmonary embolus. -LE doppler: technically difficult study due to morbid obesity, study not totally conclusive. No DVT.  -Given one dose of IV lasix (40mg ) in the ER -Echocardiogram pending -Continue full dose lovenox  -Spoke with radiologist, repeat CT scan unlikely to be helpful -Pulmonology consulted and appreciated   Left breast pain -no skin changes or drainage -Continue conservative management  -Follow up with gynecology as an outpatient  Morbid obesity -Likely with OHS -Should speak to PCP regarding weight modifications when discharged   DVT Prophylaxis  Lovenox  Code Status: Full  Family Communication: None at bedside  Disposition Plan: Currently observing. Pending pulmonology  consult and improvement in respiratory status.   Consultants Pulmonology  Procedures  LE doppler Echocardiogram  Antibiotics   Anti-infectives    None      Subjective:   Shelley Bradley seen and examined today.  Patient somnolent, but arousable.  Complains of feeling short of breath.  Denies chest pain.  Still has some left breast pain.  Denies abdominal pain, nausea, vomiting, diarrhea/constipation.    Objective:   Vitals:   04/01/16 0200 04/01/16 0249 04/01/16 0257 04/01/16 0431  BP: 147/96  (!) 143/80 (!) 108/53  Pulse: 104  (!) 108 (!) 103  Resp:   (!) 28 (!) 30  Temp:   98.8 F (37.1 C) 98.8 F (37.1 C)  TempSrc:   Oral Oral  SpO2: (!) 86%  98% 97%  Weight:  (!) 210.7 kg (464 lb 8.2 oz)    Height:  5\' 2"  (1.575 m)     No intake or output data in the 24 hours ending 04/01/16 1415 Filed Weights   03/31/16 1147 04/01/16 0001 04/01/16 0249  Weight: (!) 212.7 kg (469 lb) (!) 210.7 kg (464 lb 9 oz) (!) 210.7 kg (464 lb 8.2 oz)    Exam  General: Well developed, well nourished, NAD, appears stated age  HEENT: NCAT, mucous membranes moist.   Cardiovascular: S1 S2 auscultated, no rubs, murmurs or gallops. Tachycardic  Respiratory: Tachypneic, diminished but clear.  Abdomen: Soft, obese, nontender, nondistended, + bowel sounds  Extremities: warm dry without cyanosis clubbing or edema  Neuro: AAOx3, somnolent, but arousable. Nonfocal  Skin: Without rashes exudates or nodules  Psych: Normal affect and demeanor with intact judgement and insight   Data Reviewed: I have  personally reviewed following labs and imaging studies  CBC:  Recent Labs Lab 03/31/16 1920 04/01/16 0735  WBC 7.7 7.5  NEUTROABS 4.8  --   HGB 10.9* 11.5*  HCT 35.6* 37.6  MCV 79.5 80.2  PLT 260 303   Basic Metabolic Panel:  Recent Labs Lab 03/31/16 1920 04/01/16 0735  NA 137 138  K 3.7 4.1  CL 100* 99*  CO2 30 31  GLUCOSE 91 154*  BUN 12 13  CREATININE 0.51 0.65    CALCIUM 8.8* 8.7*   GFR: Estimated Creatinine Clearance: 173.7 mL/min (by C-G formula based on SCr of 0.8 mg/dL). Liver Function Tests:  Recent Labs Lab 03/31/16 1920 04/01/16 0735  AST 29 36  ALT 31 35  ALKPHOS 72 80  BILITOT 0.6 0.7  PROT 7.7 8.3*  ALBUMIN 3.4* 3.7   No results for input(s): LIPASE, AMYLASE in the last 168 hours. No results for input(s): AMMONIA in the last 168 hours. Coagulation Profile: No results for input(s): INR, PROTIME in the last 168 hours. Cardiac Enzymes:  Recent Labs Lab 04/01/16 0205 04/01/16 0735  TROPONINI <0.03 <0.03   BNP (last 3 results) No results for input(s): PROBNP in the last 8760 hours. HbA1C: No results for input(s): HGBA1C in the last 72 hours. CBG: No results for input(s): GLUCAP in the last 168 hours. Lipid Profile: No results for input(s): CHOL, HDL, LDLCALC, TRIG, CHOLHDL, LDLDIRECT in the last 72 hours. Thyroid Function Tests:  Recent Labs  04/01/16 0205  TSH 3.273   Anemia Panel: No results for input(s): VITAMINB12, FOLATE, FERRITIN, TIBC, IRON, RETICCTPCT in the last 72 hours. Urine analysis:    Component Value Date/Time   COLORURINE YELLOW 03/31/2016 1153   APPEARANCEUR CLEAR 03/31/2016 1153   LABSPEC 1.025 03/31/2016 1153   PHURINE 6.0 03/31/2016 1153   GLUCOSEU NEGATIVE 03/31/2016 1153   HGBUR LARGE (A) 03/31/2016 1153   BILIRUBINUR NEGATIVE 03/31/2016 1153   KETONESUR NEGATIVE 03/31/2016 1153   PROTEINUR NEGATIVE 03/31/2016 1153   UROBILINOGEN 0.2 05/13/2015 2025   NITRITE NEGATIVE 03/31/2016 1153   LEUKOCYTESUR NEGATIVE 03/31/2016 1153   Sepsis Labs: @LABRCNTIP (procalcitonin:4,lacticidven:4)  )No results found for this or any previous visit (from the past 240 hour(s)).    Radiology Studies: Dg Chest 2 View  Result Date: 03/31/2016 CLINICAL DATA:  40 year old female with chest pain and shortness of breath. EXAM: CHEST  2 VIEW COMPARISON:  Chest CT dated 10/25/2015 FINDINGS: Two views of the  chest demonstrate clear lungs with sharp costophrenic angles. There is no pneumothorax. Stable mild cardiomegaly. No acute osseous pathology. IMPRESSION: No active cardiopulmonary disease. Electronically Signed   By: Elgie Collard M.D.   On: 03/31/2016 18:26   Ct Angio Chest Pe W And/or Wo Contrast  Result Date: 04/01/2016 CLINICAL DATA:  Left breast pain, hypoxia, elevated D-dimer, increasing lower extremity edema. Exertional shortness of breath. EXAM: CT ANGIOGRAPHY CHEST WITH CONTRAST TECHNIQUE: Multidetector CT imaging of the chest was performed using the standard protocol during bolus administration of intravenous contrast. Multiplanar CT image reconstructions and MIPs were obtained to evaluate the vascular anatomy. CONTRAST:  100 cc Isovue 370. COMPARISON:  10/25/2015. FINDINGS: Cardiovascular: Image quality is significantly degraded by body habitus and respiratory motion, rendering the examination nondiagnostic for pulmonary embolus. Heart is enlarged. No pericardial effusion. Mediastinum/Nodes: No definite pathologically enlarged mediastinal, hilar or axillary lymph nodes. Lungs/Pleura: Image quality is markedly degraded by body habitus and severe respiratory motion. No large areas of consolidation. No pleural fluid. Upper Abdomen: Image quality is  markedly degraded by body habitus. Liver appears fatty. Musculoskeletal: Image quality is markedly degraded by body habitus. Grossly unremarkable. Review of the MIP images confirms the above findings. IMPRESSION: 1. Examination is severely limited by large body habitus and high respiratory rate, rendering the study nondiagnostic for pulmonary embolus. 2. Liver appears fatty. Electronically Signed   By: Leanna Battles M.D.   On: 04/01/2016 10:16     Scheduled Meds: . enoxaparin (LOVENOX) injection  210 mg Subcutaneous Q12H   Continuous Infusions:    LOS: 0 days   Time Spent in minutes   30 minutes  Lucile Hillmann D.O. on 04/01/2016 at 2:15  PM  Between 7am to 7pm - Pager - [phone]  After 7pm go to [email or web address] - password [email or web address]  And look for the night coverage person covering for me after hours  [facility] Office  [phone]

## 2016-04-01 NOTE — Progress Notes (Signed)
Rt placed CPAP on pt in CT with 5 LPM  bleed in. Pt states she can not lay flat without machine. Pt also wares a CPAP QHS at home.

## 2016-04-01 NOTE — ED Notes (Signed)
RN will pull from IV line for blood draws.

## 2016-04-01 NOTE — Progress Notes (Signed)
  Echocardiogram 2D Echocardiogram with Definity has been performed.  Nolon RodBrown, Tony 04/01/2016, 1:49 PM

## 2016-04-01 NOTE — Progress Notes (Addendum)
VASCULAR LAB PRELIMINARY  PRELIMINARY  PRELIMINARY  PRELIMINARY  Bilateral lower extremity venous duplex completed.    Preliminary report: Technically limited and inconclusive due to body habitus and respiratory interference Areas of the veins imaged showed no evidence of DVT or superficial thrombus  SLAUGHTER, [place], RVS 04/01/2016, 12:54 PM

## 2016-04-02 DIAGNOSIS — N644 Mastodynia: Secondary | ICD-10-CM

## 2016-04-02 DIAGNOSIS — R51 Headache: Secondary | ICD-10-CM

## 2016-04-02 DIAGNOSIS — R519 Headache, unspecified: Secondary | ICD-10-CM

## 2016-04-02 LAB — BLOOD GAS, ARTERIAL
Acid-Base Excess: 6.1 mmol/L — ABNORMAL HIGH (ref 0.0–2.0)
BICARBONATE: 32.8 mmol/L — AB (ref 20.0–28.0)
DELIVERY SYSTEMS: POSITIVE
DRAWN BY: 308601
Expiratory PAP: 10
Inspiratory PAP: 18
Mode: POSITIVE
O2 Content: 6 L/min
O2 Saturation: 98.9 %
PATIENT TEMPERATURE: 98.6
PH ART: 7.342 — AB (ref 7.350–7.450)
PO2 ART: 147 mmHg — AB (ref 83.0–108.0)
pCO2 arterial: 62.3 mmHg — ABNORMAL HIGH (ref 32.0–48.0)

## 2016-04-02 LAB — BASIC METABOLIC PANEL
Anion gap: 10 (ref 5–15)
BUN: 9 mg/dL (ref 6–20)
CHLORIDE: 100 mmol/L — AB (ref 101–111)
CO2: 26 mmol/L (ref 22–32)
CREATININE: 0.6 mg/dL (ref 0.44–1.00)
Calcium: 8.8 mg/dL — ABNORMAL LOW (ref 8.9–10.3)
GFR calc Af Amer: >60 mL/min (ref >60)
GFR calc non Af Amer: >60 mL/min (ref >60)
Glucose, Bld: 208 mg/dL — ABNORMAL HIGH (ref 65–99)
Potassium: 4.8 mmol/L (ref 3.5–5.1)
Sodium: 136 mmol/L (ref 135–145)

## 2016-04-02 LAB — CBC
HEMATOCRIT: 35.8 % — AB (ref 36.0–46.0)
HEMOGLOBIN: 11.1 g/dL — AB (ref 12.0–15.0)
MCH: 24.8 pg — AB (ref 26.0–34.0)
MCHC: 31 g/dL (ref 30.0–36.0)
MCV: 79.9 fL (ref 78.0–100.0)
Platelets: 263 10*3/uL (ref 150–400)
RBC: 4.48 MIL/uL (ref 3.87–5.11)
RDW: 16.7 % — ABNORMAL HIGH (ref 11.5–15.5)
WBC: 7.5 10*3/uL (ref 4.0–10.5)

## 2016-04-02 MED ORDER — ALPRAZOLAM 0.5 MG PO TABS
0.5000 mg | ORAL_TABLET | Freq: Three times a day (TID) | ORAL | Status: DC | PRN
  Administered 2016-04-03: 0.5 mg via ORAL
  Filled 2016-04-02: qty 1

## 2016-04-02 NOTE — Progress Notes (Signed)
[facility] PCCM  S: no more chest pain O: Vitals:   04/01/16 1500 04/01/16 2030 04/01/16 2330 04/02/16 0457  BP: (!) 157/82 138/82  138/84  Pulse: 89 98  98  Resp: (!) 22 (!) 28 20 (!) 24  Temp:  98.5 F (36.9 C)  98.5 F (36.9 C)  TempSrc:  Oral  Oral  SpO2: 100% 99%  100%  Weight:      Height:       RA  Gen: tearful, obese but well appearing HENT: OP clear, neck supple PULM: CTA B, normal percussion CV: RRR, no mgr, trace edema GI: BS+, soft, nontender Derm: no cyanosis or rash Psyche: normal mood and affect   Echo> mildly dilated LV, normal LVEF CT scan> images reviewed, poor image quality, can't say if PE or not LE doppler> limited study, no blood clot  Impression: Chest pain> she tells me this started after she was playing with her kids and one jumped on her chest, so favor musculoskeletal problem but agree we should rule out PE  Dyspnea> better today, on further history taking it sounds as if her problem has been mostly daytime fatigue, witnessed apneas, morning headache with some dyspnea and leg swelling for months.  Given her improvement with BIPAP overnight favor obesity hypoventilation syndrome causing chronic respiratory failure with hypercarbia and there may be a component of diastolic heart failure.  Panic  Plan: Needs repeat CT scan to rule out PE, would favor doing this tomorrow to avoid kidney toxicity, take Xanax prior to prevent panic which made the 9/6 study worse Continue BIPAP qHS, will need this and BIPAP titration study at home Weight loss encouraged Continue Lovenox for now until CT angiogram back  Husband and patient updated by me in Spanish bedside at length.  Heber CarolinaBrent McQuaid, MD [place] [facility] Pager: [phone] Cell: [phone] After 3pm or if no response, call [phone]

## 2016-04-02 NOTE — Progress Notes (Signed)
PROGRESS NOTE    [name]  [id number] DOB: 07/20/1976 DOA: 03/31/2016 PCP: No PCP Per Patient   Chief Complaint  Patient presents with  . Breast Pain     Brief Narrative:  HPI on 04/01/2016 by Dr. Midge MiniumArshad Kakrakandy  Shelley Bradley is a 40 y.o. female with morbid obesity had [facility]. Patient's complaint was left breast pain. Patient has been having this pain for last few days. Denies any fever chills trauma or injuries nipple discharge. While in the ER patient was found to be hypoxic and was [facility]. Patient's d-dimer was elevated. Patient also has not is increasing lower extremity edema. Patient states over the last 3 days patient has been having exertional shortness of breath. Patient also has symptoms of orthopnea. Patient is being admitted for acute respiratory failure with hypoxia. Patient is not in distress.  Assessment & Plan   Acute hypoxic respiratory with hypoxia -?due to PE -Elevated ddimer 0.94 -Presented with SPO2 of 86% upon admission. Tachycardic, tachypneic. -Wells score 4.5 (given symptoms of PE and HR) -CTA chest: examination is severely limited by large body habitus and high respiratory rate, rendering the study nondiagnostic for pulmonary embolus. -LE doppler: technically difficult study due to morbid obesity, study not totally conclusive. No DVT.  -Given one dose of IV lasix (40mg ) in the ER -Echocardiogram EF 60-65%, wall motion was normal, no RWMA. LV diastolic function parameters were normal.  -Continue full dose lovenox  -Spoke with radiologist, repeat CT scan unlikely to be helpful -Pulmonology consulted and appreciated- recommended VQ scan, and bipap -Currently on room air, O2 sats 100  Left breast pain -no skin changes or drainage -Continue conservative management  -Follow up with gynecology as an outpatient  Morbid obesity -Likely with OHS -Should speak to PCP  regarding weight modifications when discharged   DVT Prophylaxis  Lovenox  Code Status: Full  Family Communication: None at bedside  Disposition Plan: Pending VQ scan and further recommendations from pulmonology   Consultants Pulmonology  Procedures  LE doppler Echocardiogram  Antibiotics   Anti-infectives    None      Subjective:   Shelley Bradley seen and examined today.  Patient currently very anxious and wants to go home, states she has a baby at home. Feels breathing has improved mildly.  Afraid of repeat CT scan, states she wants a mask on her face if a repeat CT scan is done. Currently on room air.  Denies abdominal pain, nausea, vomiting, diarrhea/constipation.    Objective:   Vitals:   04/01/16 1500 04/01/16 2030 04/01/16 2330 04/02/16 0457  BP: (!) 157/82 138/82  138/84  Pulse: 89 98  98  Resp: (!) 22 (!) 28 20 (!) 24  Temp:  98.5 F (36.9 C)  98.5 F (36.9 C)  TempSrc:  Oral  Oral  SpO2: 100% 99%  100%  Weight:      Height:        Intake/Output Summary (Last 24 hours) at 04/02/16 1045 Last data filed at 04/01/16 1300  Gross per 24 hour  Intake              480 ml  Output                0 ml  Net              480 ml   Filed Weights   03/31/16 1147 04/01/16 0001 04/01/16 0249  Weight: (!) 212.7 kg (469 lb) (!) 210.7 kg (464 lb 9 oz) (!) 210.7 kg (464 lb 8.2 oz)    Exam  General: Well developed, well nourished, NAD, appears stated age  HEENT: NCAT, mucous membranes moist.   Cardiovascular: S1 S2 auscultated, no rubs, murmurs or gallops. Tachycardic  Respiratory: Tachypneic, diminished but clear with equal chest rise  Abdomen: Soft, morbidly obese, nontender, nondistended, + bowel sounds  Extremities: warm dry without cyanosis clubbing or edema  Neuro: AAOx3, Nonfocal  Psych: Anxious.    Data Reviewed: I have personally reviewed following labs and imaging studies  CBC:  Recent Labs Lab 03/31/16 1920 04/01/16 0735  04/02/16 0856  WBC 7.7 7.5 7.5  NEUTROABS 4.8  --   --   HGB 10.9* 11.5* 11.1*  HCT 35.6* 37.6 35.8*  MCV 79.5 80.2 79.9  PLT 260 303 263   Basic Metabolic Panel:  Recent Labs Lab 03/31/16 1920 04/01/16 0735 04/02/16 0856  NA 137 138 136  K 3.7 4.1 4.8  CL 100* 99* 100*  CO2 30 31 26   GLUCOSE 91 154* 208*  BUN 12 13 9   CREATININE 0.51 0.65 0.60  CALCIUM 8.8* 8.7* 8.8*   GFR: Estimated Creatinine Clearance: 173.7 mL/min (by C-G formula based on SCr of 0.8 mg/dL). Liver Function Tests:  Recent Labs Lab 03/31/16 1920 04/01/16 0735  AST 29 36  ALT 31 35  ALKPHOS 72 80  BILITOT 0.6 0.7  PROT 7.7 8.3*  ALBUMIN 3.4* 3.7   No results for input(s): LIPASE, AMYLASE in the last 168 hours. No results for input(s): AMMONIA in the last 168 hours. Coagulation Profile: No results for input(s): INR, PROTIME in the last 168 hours. Cardiac Enzymes:  Recent Labs Lab 04/01/16 0205 04/01/16 0735 04/01/16 1353  TROPONINI <0.03 <0.03 <0.03   BNP (last 3 results) No results for input(s): PROBNP in the last 8760 hours. HbA1C: No results for input(s): HGBA1C in the last 72 hours. CBG: No results for input(s): GLUCAP in the last 168 hours. Lipid Profile: No results for input(s): CHOL, HDL, LDLCALC, TRIG, CHOLHDL, LDLDIRECT in the last 72 hours. Thyroid Function Tests:  Recent Labs  04/01/16 0205  TSH 3.273   Anemia Panel: No results for input(s): VITAMINB12, FOLATE, FERRITIN, TIBC, IRON, RETICCTPCT in the last 72 hours. Urine analysis:    Component Value Date/Time   COLORURINE YELLOW 03/31/2016 1153   APPEARANCEUR CLEAR 03/31/2016 1153   LABSPEC 1.025 03/31/2016 1153   PHURINE 6.0 03/31/2016 1153   GLUCOSEU NEGATIVE 03/31/2016 1153   HGBUR LARGE (A) 03/31/2016 1153   BILIRUBINUR NEGATIVE 03/31/2016 1153   KETONESUR NEGATIVE 03/31/2016 1153   PROTEINUR NEGATIVE 03/31/2016 1153   UROBILINOGEN 0.2 05/13/2015 2025   NITRITE NEGATIVE 03/31/2016 1153   LEUKOCYTESUR  NEGATIVE 03/31/2016 1153   Sepsis Labs: @LABRCNTIP (procalcitonin:4,lacticidven:4)  )No results found for this or any previous visit (from the past 240 hour(s)).    Radiology Studies: Dg Chest 2 View  Result Date: 03/31/2016 CLINICAL DATA:  40 year old female with chest pain and shortness of breath. EXAM: CHEST  2 VIEW COMPARISON:  Chest CT dated 10/25/2015 FINDINGS: Two views of the chest demonstrate clear lungs with sharp costophrenic angles. There is no pneumothorax. Stable mild cardiomegaly. No acute osseous pathology. IMPRESSION: No active cardiopulmonary disease. Electronically Signed   By: Elgie Collard M.D.   On: 03/31/2016 18:26   Ct Angio Chest Pe W And/or Wo Contrast  Result Date: 04/01/2016 CLINICAL DATA:  Left breast pain, hypoxia, elevated D-dimer, increasing  lower extremity edema. Exertional shortness of breath. EXAM: CT ANGIOGRAPHY CHEST WITH CONTRAST TECHNIQUE: Multidetector CT imaging of the chest was performed using the standard protocol during bolus administration of intravenous contrast. Multiplanar CT image reconstructions and MIPs were obtained to evaluate the vascular anatomy. CONTRAST:  100 cc Isovue 370. COMPARISON:  10/25/2015. FINDINGS: Cardiovascular: Image quality is significantly degraded by body habitus and respiratory motion, rendering the examination nondiagnostic for pulmonary embolus. Heart is enlarged. No pericardial effusion. Mediastinum/Nodes: No definite pathologically enlarged mediastinal, hilar or axillary lymph nodes. Lungs/Pleura: Image quality is markedly degraded by body habitus and severe respiratory motion. No large areas of consolidation. No pleural fluid. Upper Abdomen: Image quality is markedly degraded by body habitus. Liver appears fatty. Musculoskeletal: Image quality is markedly degraded by body habitus. Grossly unremarkable. Review of the MIP images confirms the above findings. IMPRESSION: 1. Examination is severely limited by large body habitus  and high respiratory rate, rendering the study nondiagnostic for pulmonary embolus. 2. Liver appears fatty. Electronically Signed   By: Leanna Battles M.D.   On: 04/01/2016 10:16     Scheduled Meds: . enoxaparin (LOVENOX) injection  210 mg Subcutaneous Q12H   Continuous Infusions:    LOS: 1 day   Time Spent in minutes   30 minutes  Arlen Dupuis D.O. on 04/02/2016 at 10:45 AM  Between 7am to 7pm - Pager - [phone]  After 7pm go to [email or web address] - password [email or web address]  And look for the night coverage person covering for me after hours  [facility] Office  [phone]

## 2016-04-02 NOTE — Progress Notes (Addendum)
ANTICOAGULATION CONSULT NOTE - Follow Up Consult  Pharmacy Consult for Lovenox Indication: possible PE  No Known Allergies  Patient Measurements: Height: 5\' 2"  (157.5 cm) Weight: (!) 464 lb 8.2 oz (210.7 kg) IBW/kg (Calculated) : 50.1  Vital Signs: Temp: 98.5 F (36.9 C) (09/07 0457) Temp Source: Oral (09/07 0457) BP: 138/84 (09/07 0457) Pulse Rate: 98 (09/07 0457)  Labs:  Recent Labs  03/31/16 1920 04/01/16 0205 04/01/16 0735 04/01/16 1353 04/02/16 0856  HGB 10.9*  --  11.5*  --  11.1*  HCT 35.6*  --  37.6  --  35.8*  PLT 260  --  303  --  263  CREATININE 0.51  --  0.65  --  0.60  TROPONINI  --  <0.03 <0.03 <0.03  --     Estimated Creatinine Clearance: 173.7 mL/min (by C-G formula based on SCr of 0.8 mg/dL).   Assessment: [age] yoF with morbid obesity presents with shortness of breath, D-dimer elevated. Started on treatment Lovenox for possible PE 9/6.  LE US 9/6: no DVT noted however study was inconclusive due to body habitus CT chest 9/6: non-diagnostic for PE due to body habitus and respiratory rate. Heart enlarged. No pleural fluid or consolidation.   Today, 04/02/2016: - Hgb stable, plts WNL - No bleeding/complications reported.   Goal of Therapy:  Anti-Xa level 0.6-1 units/ml 4hrs after LMWH dose given Monitor platelets by anticoagulation protocol: Yes   Plan:  Continue Lovenox 1 mg/kg (210 mg) SQ q12h. Obtain Anti-Xa level today 4 hours after AM Lovenox dose.  Clance BollRunyon, Shoaib Siefker 04/02/2016,10:12 AM   ADDENDUM: 04/02/2016 12:43 PM Lab unable to obtain sample for Anti-Xa level. Per lab, currently only able to use finger sticks for labs and unable to use finger stick sample for Anti-Xa level.  Typically do not obtain Anti-Xa levels for Lovenox treatment but wanted to confirm therapeutic levels in this patient due to morbid obesity.  Plan: Continue Lovenox 210 mg SQ q12h. Repeat CT angio pending. If PE confirmed, will f/u plan for long term anticoagulation and  reassess need for Anti-Xa level if plan for Lovenox as continued treatment.  Clance BollAmanda Kelcey Korus, PharmD, BCPS Pager: [phone] 04/02/2016 12:51 PM

## 2016-04-03 ENCOUNTER — Inpatient Hospital Stay (HOSPITAL_COMMUNITY): Payer: Self-pay

## 2016-04-03 ENCOUNTER — Encounter (HOSPITAL_COMMUNITY): Payer: Self-pay | Admitting: *Deleted

## 2016-04-03 DIAGNOSIS — R0600 Dyspnea, unspecified: Secondary | ICD-10-CM

## 2016-04-03 LAB — CBC
HEMATOCRIT: 35.5 % — AB (ref 36.0–46.0)
Hemoglobin: 10.6 g/dL — ABNORMAL LOW (ref 12.0–15.0)
MCH: 24.8 pg — ABNORMAL LOW (ref 26.0–34.0)
MCHC: 29.9 g/dL — ABNORMAL LOW (ref 30.0–36.0)
MCV: 82.9 fL (ref 78.0–100.0)
PLATELETS: 280 10*3/uL (ref 150–400)
RBC: 4.28 MIL/uL (ref 3.87–5.11)
RDW: 16.9 % — AB (ref 11.5–15.5)
WBC: 7.8 10*3/uL (ref 4.0–10.5)

## 2016-04-03 LAB — BASIC METABOLIC PANEL
ANION GAP: 7 (ref 5–15)
BUN: 9 mg/dL (ref 6–20)
CALCIUM: 8.8 mg/dL — AB (ref 8.9–10.3)
CO2: 33 mmol/L — AB (ref 22–32)
Chloride: 97 mmol/L — ABNORMAL LOW (ref 101–111)
Creatinine, Ser: 0.45 mg/dL (ref 0.44–1.00)
Glucose, Bld: 149 mg/dL — ABNORMAL HIGH (ref 65–99)
POTASSIUM: 4 mmol/L (ref 3.5–5.1)
Sodium: 137 mmol/L (ref 135–145)

## 2016-04-03 MED ORDER — IOPAMIDOL (ISOVUE-370) INJECTION 76%
100.0000 mL | Freq: Once | INTRAVENOUS | Status: AC | PRN
  Administered 2016-04-03: 100 mL via INTRAVENOUS

## 2016-04-03 NOTE — Progress Notes (Signed)
[facility] PCCM  S: Feels OK, no chest pain, no dyspnea  O: Vitals:   04/02/16 2124 04/02/16 2330 04/03/16 0556 04/03/16 0938  BP: 136/72  123/73 136/80  Pulse: 99  94 90  Resp: (!) 30 (!) 22  (!) 22  Temp: 98.3 F (36.8 C)  98.5 F (36.9 C)   TempSrc: Oral  Oral   SpO2: 99%  99% 99%  Weight:   (!) 470 lb 11.2 oz (213.5 kg)   Height:       RA  Gen: obese but well appearing HENT: OP clear, neck supple PULM: CTA B, normal percussion CV: RRR, no mgr, trace edema GI: BS+, soft, nontender Derm: no cyanosis or rash Psyche: normal mood and affect   Echo> mildly dilated LV, normal LVEF CT scan> images reviewed, poor image quality, can't say if PE or not LE doppler> limited study, no blood clot  Impression: Chest pain> she tells me this started after she was playing with her kids and one jumped on her chest, so favor musculoskeletal problem but agree we should rule out PE  Dyspnea> better today  Panic  Plan: Will need outpatient BIPAP titration study If CT angiogram today negative, then d/c home .  Heber CarolinaBrent Marvin Maenza, MD [place] [facility] Pager: [phone] Cell: [phone] After 3pm or if no response, call [phone]

## 2016-04-03 NOTE — Progress Notes (Signed)
Patient insisted on getting her Bari bed changed back to a regular size bed despite objections from staff about patient's comfort.  Patient also expressed desire to get in shower but on call deferred that decision to rounding MD.  Will continue to monitor patient.

## 2016-04-03 NOTE — Progress Notes (Addendum)
PROGRESS NOTE    Memory Danceanci Shelley Bradley  IRJ:[id number] DOB: 03/14/1976 DOA: 03/31/2016 PCP: No PCP Per Patient   Chief Complaint  Patient presents with  . Breast Pain     Brief Narrative:  HPI on 04/01/2016 by Dr. Midge MiniumArshad Kakrakandy  Shelley Bradley is a 40 y.o. female with morbid obesity had originally presented to the ER at [facility]. Patient's complaint was left breast pain. Patient has been having this pain for last few days. Denies any fever chills trauma or injuries nipple discharge. While in the ER patient was found to be hypoxic and [facility]. Patient's d-dimer was elevated. Patient also has not is increasing lower extremity edema. Patient states over the last 3 days patient has been having exertional shortness of breath. Patient also has symptoms of orthopnea. Patient is being admitted for acute respiratory failure with hypoxia. Patient is not in distress.  Assessment & Plan   Acute hypoxic respiratory with hypoxia -?due to PE -Elevated ddimer 0.94 -Presented with SPO2 of 86% upon admission. Tachycardic, tachypneic. -Wells score 4.5 (given symptoms of PE and HR) -CTA chest: examination is severely limited by large body habitus and high respiratory rate, rendering the study nondiagnostic for pulmonary embolus. -LE doppler: technically difficult study due to morbid obesity, study not totally conclusive. No DVT.  -Given one dose of IV lasix (40mg ) in the ER -Echocardiogram EF 60-65%, wall motion was normal, no RWMA. LV diastolic function parameters were normal.  -Continue full dose lovenox  -Spoke with radiologist, repeat CT scan unlikely to be helpful -Pulmonology consulted and appreciated- recommended repeat CT scan with antianxiety  meds -Currently on room air, O2 sats 100 -Will order CTA PE study.  Left breast pain -no skin changes or drainage -Continue conservative management  -Follow up with gynecology as an outpatient  Morbid  obesity -Likely with OHS -Should speak to PCP regarding weight modifications when discharged   Normocytic anemia -Baseline 10-11, currently 10.6 -Continue to monitor CBC  DVT Prophylaxis  Lovenox  Code Status: Full  Family Communication: None at bedside  Disposition Plan: Admitted. Pending CT scan and further recommendations from pulmonology   Consultants Pulmonology  Procedures  LE doppler Echocardiogram  Antibiotics   Anti-infectives    None      Subjective:   Shelley Bradley seen and examined today.  Patient currently sleeping, resting well.  Feels breathing has improved. Mildly anxious.  Still does not want a repeat CT scan due to anxiety. Denies abdominal pain, nausea, vomiting, diarrhea/constipation.    Objective:   Vitals:   04/02/16 2124 04/02/16 2330 04/03/16 0556 04/03/16 0938  BP: 136/72  123/73 136/80  Pulse: 99  94 90  Resp: (!) 30 (!) 22  (!) 22  Temp: 98.3 F (36.8 C)  98.5 F (36.9 C)   TempSrc: Oral  Oral   SpO2: 99%  99% 99%  Weight:   (!) 213.5 kg (470 lb 11.2 oz)   Height:        Intake/Output Summary (Last 24 hours) at 04/03/16 1202 Last data filed at 04/02/16 1500  Gross per 24 hour  Intake              480 ml  Output                0 ml  Net              480 ml   Filed Weights   04/01/16 0001 04/01/16 0249 04/03/16  0556  Weight: (!) 210.7 kg (464 lb 9 oz) (!) 210.7 kg (464 lb 8.2 oz) (!) 213.5 kg (470 lb 11.2 oz)    Exam  General: Well developed, well nourished, NAD, morbidly obese  HEENT: NCAT, mucous membranes moist.   Cardiovascular: S1 S2 auscultated, no rubs, murmurs or gallops. RRR  Respiratory: Tachypneic, diminished but clear with equal chest rise  Abdomen: Soft, morbidly obese, nontender, nondistended, + bowel sounds  Extremities: warm dry without cyanosis clubbing or edema  Neuro: AAOx3, Nonfocal  Psych: Appropriate mood and, slightly anxious    Data Reviewed: I have personally reviewed following  labs and imaging studies  CBC:  Recent Labs Lab 03/31/16 1920 04/01/16 0735 04/02/16 0856 04/03/16 0545  WBC 7.7 7.5 7.5 7.8  NEUTROABS 4.8  --   --   --   HGB 10.9* 11.5* 11.1* 10.6*  HCT 35.6* 37.6 35.8* 35.5*  MCV 79.5 80.2 79.9 82.9  PLT 260 303 263 280   Basic Metabolic Panel:  Recent Labs Lab 03/31/16 1920 04/01/16 0735 04/02/16 0856 04/03/16 0545  NA 137 138 136 137  K 3.7 4.1 4.8 4.0  CL 100* 99* 100* 97*  CO2 30 31 26  33*  GLUCOSE 91 154* 208* 149*  BUN 12 13 9 9   CREATININE 0.51 0.65 0.60 0.45  CALCIUM 8.8* 8.7* 8.8* 8.8*   GFR: Estimated Creatinine Clearance: 175.6 mL/min (by C-G formula based on SCr of 0.8 mg/dL). Liver Function Tests:  Recent Labs Lab 03/31/16 1920 04/01/16 0735  AST 29 36  ALT 31 35  ALKPHOS 72 80  BILITOT 0.6 0.7  PROT 7.7 8.3*  ALBUMIN 3.4* 3.7   No results for input(s): LIPASE, AMYLASE in the last 168 hours. No results for input(s): AMMONIA in the last 168 hours. Coagulation Profile: No results for input(s): INR, PROTIME in the last 168 hours. Cardiac Enzymes:  Recent Labs Lab 04/01/16 0205 04/01/16 0735 04/01/16 1353  TROPONINI <0.03 <0.03 <0.03   BNP (last 3 results) No results for input(s): PROBNP in the last 8760 hours. HbA1C: No results for input(s): HGBA1C in the last 72 hours. CBG: No results for input(s): GLUCAP in the last 168 hours. Lipid Profile: No results for input(s): CHOL, HDL, LDLCALC, TRIG, CHOLHDL, LDLDIRECT in the last 72 hours. Thyroid Function Tests:  Recent Labs  04/01/16 0205  TSH 3.273   Anemia Panel: No results for input(s): VITAMINB12, FOLATE, FERRITIN, TIBC, IRON, RETICCTPCT in the last 72 hours. Urine analysis:    Component Value Date/Time   COLORURINE YELLOW 03/31/2016 1153   APPEARANCEUR CLEAR 03/31/2016 1153   LABSPEC 1.025 03/31/2016 1153   PHURINE 6.0 03/31/2016 1153   GLUCOSEU NEGATIVE 03/31/2016 1153   HGBUR LARGE (A) 03/31/2016 1153   BILIRUBINUR NEGATIVE  03/31/2016 1153   KETONESUR NEGATIVE 03/31/2016 1153   PROTEINUR NEGATIVE 03/31/2016 1153   UROBILINOGEN 0.2 05/13/2015 2025   NITRITE NEGATIVE 03/31/2016 1153   LEUKOCYTESUR NEGATIVE 03/31/2016 1153   Sepsis Labs: @LABRCNTIP (procalcitonin:4,lacticidven:4)  )No results found for this or any previous visit (from the past 240 hour(s)).    Radiology Studies: No results found.   Scheduled Meds: . enoxaparin (LOVENOX) injection  210 mg Subcutaneous Q12H   Continuous Infusions:    LOS: 2 days   Time Spent in minutes   30 minutes  Lichelle Viets D.O. on 04/03/2016 at 12:02 PM  Between 7am to 7pm - Pager - [phone]  After 7pm go to [email or web address] - password [email or web address]  And look for the night coverage person  covering for me after hours  [facility] Office  [phone]

## 2016-04-03 NOTE — Care Management Note (Signed)
Case Management Note  Patient Details  Name: Shelley Bradley MRN: [id number] Date of Birth: 06/14/1976  Subjective/Objective: Per patient(Spanish speaking) requesting CM to talk to  her dtr-Mary Ann(speaks English) on phone about d/c plans-explained to dtr pcp appt Oct 24 @ 9:30a [facility] site-if d/c over weekend [facility], if d/c Monday or during the week-[facility].Artistinancial counselor following for financial resources.  Shelley Bradley voiced understanding.              Action/Plan:d/c plan home.   Expected Discharge Date:                  Expected Discharge Plan:  Home/Self Care  In-House Referral:  Interpreting Services  Discharge planning Services  [facility]  Post Acute Care Choice:    Choice offered to:     DME Arranged:    DME Agency:     HH Arranged:    HH Agency:     Status of Service:  In process, will continue to follow  If discussed at Long Length of Stay Meetings, dates discussed:    Additional Comments:  Lanier ClamMahabir, Abdalla Naramore, RN 04/03/2016, 1:12 PM

## 2016-04-04 DIAGNOSIS — R0789 Other chest pain: Secondary | ICD-10-CM

## 2016-04-04 LAB — CBC
HEMATOCRIT: 35.3 % — AB (ref 36.0–46.0)
HEMOGLOBIN: 10.4 g/dL — AB (ref 12.0–15.0)
MCH: 24.4 pg — ABNORMAL LOW (ref 26.0–34.0)
MCHC: 29.5 g/dL — AB (ref 30.0–36.0)
MCV: 82.9 fL (ref 78.0–100.0)
Platelets: 276 10*3/uL (ref 150–400)
RBC: 4.26 MIL/uL (ref 3.87–5.11)
RDW: 16.9 % — AB (ref 11.5–15.5)
WBC: 8.6 10*3/uL (ref 4.0–10.5)

## 2016-04-04 LAB — HEPARIN ANTI-XA: Heparin LMW: 1.27 IU/mL

## 2016-04-04 NOTE — Progress Notes (Signed)
Pharmacy - Brief Note (LMWH anti-Xa level follow-up)  Patient on enoxaparin for possible PE.  Per discharge summary, patient will not be sent home on anticoagulation as CTA x 2 non-diagnostic for PE (limited studies d/t body habitus).  Anti-Xa level ordered prior to knowing plan.  9/9 LMWH anti-Xa = 1.27 (drawn 4h after dose) prior to 8th dose  Goal anti-Xa level: 0.6-1 units/mL  Plan: - Pharmacy found patient not going home on enoxaparin until after level drawn/resulted.  Anti-Xa level is supratherapeutc.   - If plans were to change and enoxaparin were to be continued, suggest change enoxaparin to 160mg  SQ q12h  Juliette Alcideustin Zeigler, PharmD, BCPS.   Pager: [phone] 04/04/2016 3:50 PM

## 2016-04-04 NOTE — Progress Notes (Signed)
[facility] PCCM  S: Feels OK, no chest pain, no dyspnea. Denies pain to me  O: Vitals:   04/03/16 1600 04/03/16 2225 04/03/16 2347 04/04/16 0656  BP: (!) 133/94 (!) 143/68  132/69  Pulse: 92 98 (!) 101 97  Resp: 20 20 20 20   Temp: 98.6 F (37 C) 99.5 F (37.5 C)    TempSrc: Oral Oral    SpO2: 99% 93% 93% 99%  Weight:      Height:       RA  Gen: Morbidly obese but well appearing HENT: OP clear, neck supple PULM: CTA B, normal percussion CV: RRR, no mgr, trace edema GI: BS+, soft, nontender Derm: no cyanosis or rash Psyche: normal mood and affect   Echo> mildly dilated LV, normal LVEF CT scan x 2 > images reviewed, poor image quality, can't say if PE or not LE doppler> limited study, no blood clot  Impression: Chest pain> she tells me this started after she was playing with her kids and one jumped on her chest, so favor musculoskeletal problem   Dyspnea> better today, explained by obesity hypoventilation  Panic  Plan: Will need outpatient BIPAP titration study Discussed with Dr Catha GosselinMikhail. She has discussed alternative diagnostic procedures with radiology. She is just too big to image definitively.  We have alternative explanations for her symptoms. Empiric anticoagulation has its own significant risk of complications. Outpatient clinic follow-up has been arranged I don't have any additional reason for her to stay in hospital. I think we have done what can be done in good faith effort. Ok to d/c home from Pulmonary standpoint . Terisa Starr.  CD Carman Essick, MD PCCM p [phone] After 3pm or if no response, call [phone]

## 2016-04-04 NOTE — Discharge Summary (Signed)
Physician Discharge Summary  [name] UJW:[id number] DOB: 12/03/1975 DOA: 03/31/2016  PCP: No PCP Per Patient  Admit date: 03/31/2016 Discharge date: 04/04/2016  Time spent: 45 minutes  Recommendations for Outpatient Follow-up:  Patient will be discharged to home.  Patient will need to follow up with primary care provider within one week of discharge.  Follow up with pulmonology. Outpatient BIPAP titration study.  Patient should continue medications as prescribed.  Patient should follow a low fat diet diet.   Discharge Diagnoses:  Principal Problem:   Acute respiratory failure with hypoxia [facility](HCC) Active Problems:   Breast pain   Dyspnea   Cephalalgia   Discharge Condition: Stable  Diet recommendation: low fat diet  Filed Weights   04/01/16 0001 04/01/16 0249 04/03/16 0556  Weight: (!) 210.7 kg (464 lb 9 oz) (!) 210.7 kg (464 lb 8.2 oz) (!) 213.5 kg (470 lb 11.2 oz)    History of present illness:  on 04/01/2016 by Dr. Midge MiniumArshad Kakrakandy  Karleen HampshireNanci Balanzar Lopezis a 40 y.o.femalewith morbid obesity had originally presented to [facility] ER [facility]. Patient's complaint was left breast pain. Patient has been having this pain for last few days. Denies any fever chills trauma or injuries nipple discharge. While in the ER patient was found to be hypoxic and was [facility]. Patient's d-dimer was elevated. Patient also has not is increasing lower extremity edema. Patient states over the last 3 days patient has been having exertional shortness of breath. Patient also has symptoms of orthopnea. Patient is being admitted for acute respiratory failure with hypoxia. Patient is not in distress.  Hospital Course:  Acute hypoxic respiratory with hypoxia -?due to PE, but cannot fully rule out rule in definitevly -Elevated ddimer 0.94 -Presented with SPO2 of 86% upon admission. Tachycardic, tachypneic. -Wells score 4.5 (given symptoms of PE and HR) -CTA chest:  examination is severely limited by large body habitus and high respiratory rate, rendering the study nondiagnostic for pulmonary embolus. -LE doppler: technically difficult study due to morbid obesity, study not totally conclusive. No DVT.  -Given one dose of IV lasix (40mg ) in the ER -Echocardiogram EF 60-65%, wall motion was normal, no RWMA. LV diastolic function parameters were normal.  -Initially placed on full dose lovenox  -Spoke with radiologist, Dr. Fredirick LatheBlietz, repeat CT scan unlikely to be helpful -Pulmonology consulted and appreciated- recommended repeat CT scan with antianxiety  meds -Currently on room air, O2 sats 100 -Reordered CTA PE study on 04/03/2016: No infiltrate or pulmonary edema, significant limited study by patient's large body habitus and breathing motion artifacts. The study is nondiagnostic for pulmonary embolism. -Discussed case with Dr. Grace IsaacWatts, interventional radiologist, discussed possible need for angiogram however at this time due to patient's body habitus will likely be low yield. VQ scan will also be indeterminant more than likely. -Discussed with IR as well as Dr. Maple HudsonYoung, pulmonology: Patient certainly has other reasons for being having shortness of breath as well as hypoxia including her morbid obesity and deconditioning. 2 CTA PE studies have been done and have been limited. Lower ext Doppler was also limited and could not fully exclude DVT or rule in DVT. Echocardiogram obtained showing no right heart strain. Placing patient on them. Anticoagulation also carries its own risks. At this time after discussing the case with multiple physicians, we'll not discharge patient with anticoagulants. She will need to follow-up with pulmonology as an outpatient as well as a primary care physician. -Of note, patient did tell Dr.McQuaid, that  prior to admission, her child jumped on her causing her to have chest pain.   Left breast pain/Chest pain -no skin changes or drainage -Likely  musculoskeletal given information regarding patient's child jumping on her. -Continue conservative management   Morbid obesity -Likely with OHS -Should speak to PCP regarding weight modifications when discharged  -BiPAP titration study ordered as an outpatient  Normocytic anemia -Baseline 10-11, currently 10.4  Consultants Pulmonology Interventional radiology, via phone  Procedures  LE doppler Echocardiogram  Discharge Exam: Vitals:   04/03/16 2347 04/04/16 0656  BP:  132/69  Pulse: (!) 101 97  Resp: 20 20  Temp:     Exam  General: Well developed, well nourished, NAD, morbidly obese  HEENT: NCAT, mucous membranes moist.   Cardiovascular: S1 S2 auscultated, no rubs, murmurs or gallops. RRR  Respiratory: Diminished but clear with equal chest rise, no wheezing  Abdomen: Soft, morbidly obese, nontender, nondistended, + bowel sounds  Extremities: warm dry without cyanosis clubbing or edema  Neuro: AAOx3, Nonfocal  Psych: Appropriate  Discharge Instructions Discharge Instructions    Discharge instructions    Complete by:  As directed   Patient will be discharged to home.  Patient will need to follow up with primary care provider within one week of discharge.  Follow up with pulmonology. Outpatient BIPAP titration study.  Patient should continue medications as prescribed.  Patient should follow a low fat diet diet.     Current Discharge Medication List    CONTINUE these medications which have NOT CHANGED   Details  ibuprofen (ADVIL,MOTRIN) 200 MG tablet Take 800 mg by mouth every 6 (six) hours as needed.       No Known Allergies Follow-up Information    [facility]. Go on 05/19/2016.   Why:  appointment is at 9:30 phone number: [phone] Contact information: [address] [place] [address]       [facility] .   Contact information: [address] [id number] [place] [address] [phone]           The results of significant diagnostics from this hospitalization (including imaging, microbiology, ancillary and laboratory) are listed below for reference.    Significant Diagnostic Studies: Dg Chest 2 View  Result Date: 03/31/2016 CLINICAL DATA:  40 year old female with chest pain and shortness of breath. EXAM: CHEST  2 VIEW COMPARISON:  Chest CT dated 10/25/2015 FINDINGS: Two views of the chest demonstrate clear lungs with sharp costophrenic angles. There is no pneumothorax. Stable mild cardiomegaly. No acute osseous pathology. IMPRESSION: No active cardiopulmonary disease. Electronically Signed   By: Elgie Collard M.D.   On: 03/31/2016 18:26   Ct Angio Chest Pe W Or Wo Contrast  Result Date: 04/03/2016 CLINICAL DATA:  Left breast pain, morbid obesity, dyspnea EXAM: CT ANGIOGRAPHY CHEST WITH CONTRAST TECHNIQUE: Multidetector CT imaging of the chest was performed using the standard protocol during bolus administration of intravenous contrast. Multiplanar CT image reconstructions and MIPs were obtained to evaluate the vascular anatomy. CONTRAST:  100 cc Isovue COMPARISON:  04/01/2016 FINDINGS: Cardiovascular: There are extensive artifacts from patient's large body habitus. Cardiac size is stable. No aortic aneurysm. The study is nondiagnostic for pulmonary embolus due to extensive artifacts. Mediastinum/Nodes: No mediastinal hematoma or adenopathy. No hilar adenopathy. Central airways are patent. Lungs/Pleura: Images of the lung parenchyma shows no infiltrate or pulmonary edema. There are breathing motion artifacts. No pleural effusion. No pneumothorax. Upper abdomen:  Fatty infiltration of the liver is noted. Musculoskeletal: Sagittal images of the spine mild degenerative changes thoracic spine. No destructive bony lesions are noted. No destructive rib lesions are noted. Review of the MIP images confirms the above  findings. IMPRESSION: 1. No infiltrate or pulmonary edema. Significant limited study by patient's large body habitus and breathing motion artifacts. The study is nondiagnostic for pulmonary embolus. 2. No mediastinal hematoma or adenopathy. Central airways are patent. 3. Fatty infiltration of the liver. Electronically Signed   By: Natasha Mead M.D.   On: 04/03/2016 17:31   Ct Angio Chest Pe W And/or Wo Contrast  Result Date: 04/01/2016 CLINICAL DATA:  Left breast pain, hypoxia, elevated D-dimer, increasing lower extremity edema. Exertional shortness of breath. EXAM: CT ANGIOGRAPHY CHEST WITH CONTRAST TECHNIQUE: Multidetector CT imaging of the chest was performed using the standard protocol during bolus administration of intravenous contrast. Multiplanar CT image reconstructions and MIPs were obtained to evaluate the vascular anatomy. CONTRAST:  100 cc Isovue 370. COMPARISON:  10/25/2015. FINDINGS: Cardiovascular: Image quality is significantly degraded by body habitus and respiratory motion, rendering the examination nondiagnostic for pulmonary embolus. Heart is enlarged. No pericardial effusion. Mediastinum/Nodes: No definite pathologically enlarged mediastinal, hilar or axillary lymph nodes. Lungs/Pleura: Image quality is markedly degraded by body habitus and severe respiratory motion. No large areas of consolidation. No pleural fluid. Upper Abdomen: Image quality is markedly degraded by body habitus. Liver appears fatty. Musculoskeletal: Image quality is markedly degraded by body habitus. Grossly unremarkable. Review of the MIP images confirms the above findings. IMPRESSION: 1. Examination is severely limited by large body habitus and high respiratory rate, rendering the study nondiagnostic for pulmonary embolus. 2. Liver appears fatty. Electronically Signed   By: Leanna Battles M.D.   On: 04/01/2016 10:16    Microbiology: No results found for this or any previous visit (from the past 240 hour(s)).    Labs: Basic Metabolic Panel:  Recent Labs Lab 03/31/16 1920 04/01/16 0735 04/02/16 0856 04/03/16 0545  NA 137 138 136 137  K 3.7 4.1 4.8 4.0  CL 100* 99* 100* 97*  CO2 30 31 26  33*  GLUCOSE 91 154* 208* 149*  BUN 12 13 9 9   CREATININE 0.51 0.65 0.60 0.45  CALCIUM 8.8* 8.7* 8.8* 8.8*   Liver Function Tests:  Recent Labs Lab 03/31/16 1920 04/01/16 0735  AST 29 36  ALT 31 35  ALKPHOS 72 80  BILITOT 0.6 0.7  PROT 7.7 8.3*  ALBUMIN 3.4* 3.7   No results for input(s): LIPASE, AMYLASE in the last 168 hours. No results for input(s): AMMONIA in the last 168 hours. CBC:  Recent Labs Lab 03/31/16 1920 04/01/16 0735 04/02/16 0856 04/03/16 0545 04/04/16 0555  WBC 7.7 7.5 7.5 7.8 8.6  NEUTROABS 4.8  --   --   --   --   HGB 10.9* 11.5* 11.1* 10.6* 10.4*  HCT 35.6* 37.6 35.8* 35.5* 35.3*  MCV 79.5 80.2 79.9 82.9 82.9  PLT 260 303 263 280 276   Cardiac Enzymes:  Recent Labs Lab 04/01/16 0205 04/01/16 0735 04/01/16 1353  TROPONINI <0.03 <0.03 <0.03   BNP: BNP (last 3 results)  Recent Labs  03/31/16 1920  BNP 19.3    ProBNP (last 3 results) No results for input(s): PROBNP in the last 8760 hours.  CBG: No results for input(s): GLUCAP in the last 168 hours.     SignedEdsel Petrin  [facility] 04/04/2016, 12:19 PM

## 2016-04-04 NOTE — Progress Notes (Signed)
ANTICOAGULATION CONSULT NOTE - Follow Up Consult  Pharmacy Consult for Lovenox Indication: possible PE  No Known Allergies  Patient Measurements: Height: 5\' 2"  (157.5 cm) Weight: (!) 470 lb 11.2 oz (213.5 kg) IBW/kg (Calculated) : 50.1  Vital Signs: Temp: 99.5 F (37.5 C) (09/08 2225) Temp Source: Oral (09/08 2225) BP: 132/69 (09/09 0656) Pulse Rate: 97 (09/09 0656)  Labs:  Recent Labs  04/01/16 1353  04/02/16 0856 04/03/16 0545 04/04/16 0555  HGB  --   < > 11.1* 10.6* 10.4*  HCT  --   --  35.8* 35.5* 35.3*  PLT  --   --  263 280 276  CREATININE  --   --  0.60 0.45  --   TROPONINI <0.03  --   --   --   --   < > = values in this interval not displayed.  Estimated Creatinine Clearance: 175.6 mL/min (by C-G formula based on SCr of 0.8 mg/dL).   Assessment: [age] yoF with morbid obesity presents with shortness of breath, D-dimer elevated. Started on treatment Lovenox for possible PE 9/6.  LE US 9/6: no DVT noted however study was inconclusive due to body habitus CT chest 9/6: non-diagnostic for PE due to body habitus and respiratory rate. Heart enlarged. No pleural fluid or consolidation.   Today, 04/04/2016: - Hgb stable, plts WNL - 9/8 repeat CTA non-diagnostic for PE  - No bleeding/complications reported.   Goal of Therapy:  Anti-Xa level 0.6-1 units/ml 4hrs after LMWH dose given Monitor platelets by anticoagulation protocol: Yes   Plan:   Continue enoxaparin 210mg  (1mg /kg) SQ q12h  Earlier this week unable to get LMWH anti-Xa level as lab unable to get peripheral stick (cannot use finger sticks), spoke with phlebotomy today and they may be able to get level since did get venipuncture this am.  Get 4h LMWH anti-Xa level  Juliette Alcideustin Zeigler, PharmD, BCPS.   Pager: [phone] 04/04/2016 10:13 AM

## 2016-04-04 NOTE — Discharge Instructions (Signed)
Respiratory failure is when your lungs are not working well and your breathing (respiratory) system fails. When respiratory failure occurs, it is difficult for your lungs to get enough oxygen, get rid of carbon dioxide, or both. Respiratory failure can be life threatening.  °Respiratory failure can be acute or chronic. Acute respiratory failure is sudden, severe, and requires emergency medical treatment. Chronic respiratory failure is less severe, happens over time, and requires ongoing treatment.  °WHAT ARE THE CAUSES OF ACUTE RESPIRATORY FAILURE?  °Any problem affecting the heart or lungs can cause acute respiratory failure. Some of these causes include the following: °· Chronic bronchitis and emphysema (COPD).   °· Blood clot going to a lung (pulmonary embolism).   °· Having water in the lungs caused by heart failure, lung injury, or infection (pulmonary edema).   °· Collapsed lung (pneumothorax).   °· Pneumonia.   °· Pulmonary fibrosis.   °· Obesity.   °· Asthma.   °· Heart failure.   °· Any type of trauma to the chest that can make breathing difficult.   °· Nerve or muscle diseases making chest movements difficult. °HOW WILL MY ACUTE RESPIRATORY FAILURE BE TREATED?  °Treatment of acute respiratory failure depends on the cause of the respiratory failure. Usually, you will stay in the intensive care unit so your breathing can be watched closely. Treatment can include the following: °· Oxygen. Oxygen can be delivered through the following: °¨ Nasal cannula. This is small tubing that goes in your nose to give you oxygen. °¨ Face mask. A face mask covers your nose and mouth to give you oxygen. °· Medicine. Different medicines can be given to help with breathing. These can include: °¨ Nebulizers. Nebulizers deliver medicines to open the air passages (bronchodilators). These medicines help to open or relax the airways in the lungs so you can breathe better. They can also help loosen mucus from your  lungs. °¨ Diuretics. Diuretic medicines can help you breathe better by getting rid of extra water in your body. °¨ Steroids. Steroid medicines can help decrease swelling (inflammation) in your lungs. °¨ Antibiotics. °· Chest tube. If you have a collapsed lung (pneumothorax), a chest tube is placed to help reinflate the lung. °· Noninvasive positive pressure ventilation (NPPV). This is a tight-fitting mask that goes over your nose and mouth. The mask has tubing that is attached to a machine. The machine blows air into the tubing, which helps to keep the tiny air sacs (alveoli) in your lungs open. This machine allows you to breathe on your own. °· Ventilator. A ventilator is a breathing machine. When on a ventilator, a breathing tube is put into the lungs. A ventilator is used when you can no longer breathe well enough on your own. You may have low oxygen levels or high carbon dioxide (CO2) levels in your blood. When you are on a ventilator, sedation and pain medicines are given to make you sleep so your lungs can heal. °SEEK IMMEDIATE MEDICAL CARE IF: °· You have shortness of breath (dyspnea) with or without activity. °· You have rapid breathing (tachypnea). °· You are wheezing. °· You are unable to say more than a few words without having to catch your breath. °· You find it very difficult to function normally. °· You have a fast heart rate. °· You have a bluish color to your finger or toe nail beds. °· You have confusion or drowsiness or both. °  °This information is not intended to replace advice given to you by your health care provider. Make sure you discuss   any questions you have with your health care provider. °  °Document Released: 07/18/2013 Document Revised: 04/03/2015 Document Reviewed: 07/18/2013 °Elsevier Interactive Patient Education ©2016 Elsevier Inc. ° °

## 2016-04-09 ENCOUNTER — Ambulatory Visit (HOSPITAL_COMMUNITY)
Admit: 2016-04-09 | Discharge: 2016-04-09 | Disposition: A | Discharge status: Home / Self Care | Payer: Self-pay | Attending: Obstetrics and Gynecology | Admitting: Obstetrics and Gynecology

## 2016-04-09 ENCOUNTER — Encounter (HOSPITAL_COMMUNITY): Payer: Self-pay

## 2016-04-09 ENCOUNTER — Ambulatory Visit
Admit: 2016-04-09 | Discharge: 2016-04-09 | Disposition: A | Discharge status: Home / Self Care | Payer: No Typology Code available for payment source | Attending: Obstetrics and Gynecology | Admitting: Obstetrics and Gynecology

## 2016-04-09 ENCOUNTER — Other Ambulatory Visit (HOSPITAL_COMMUNITY): Payer: Self-pay | Admitting: Obstetrics and Gynecology

## 2016-04-09 ENCOUNTER — Other Ambulatory Visit (HOSPITAL_COMMUNITY): Payer: Self-pay | Admitting: Certified Nurse Midwife

## 2016-04-09 VITALS — BP 118/72 | Temp 98.2°F | Ht 62.0 in | Wt >= 6400 oz

## 2016-04-09 DIAGNOSIS — N644 Mastodynia: Secondary | ICD-10-CM

## 2016-04-09 DIAGNOSIS — N6452 Nipple discharge: Secondary | ICD-10-CM

## 2016-04-09 DIAGNOSIS — N6320 Unspecified lump in the left breast, unspecified quadrant: Secondary | ICD-10-CM

## 2016-04-09 DIAGNOSIS — Z1239 Encounter for other screening for malignant neoplasm of breast: Secondary | ICD-10-CM

## 2016-04-09 NOTE — Patient Instructions (Signed)
Explained breast self awareness to Shelley Bradley. Patient did not need a Pap smear today due to last Pap smear was in 2015 per patient. Let her know [facility] will cover Pap smears every 3 years unless has a history of abnormal Pap smears. Reminded patient that her next Pap smear will be due next year. [facility] and possible left breast ultrasound. Appointment scheduled for Thursday, April 09, 2016 at 1030. Shelley Bradley verbalized understanding.  Emelda Kohlbeck, Kathaleen Maserhristine Poll, RN 10:44 AM

## 2016-04-09 NOTE — Progress Notes (Signed)
Complaints of left breast pain x 15 days that comes and goes. Patient rates pain at a 10 out of 10. Patient complained of left breast lump and clear spontaneous nipple discharge.  Pap Smear: Pap smear not completed today. Last Pap smear was in 2015 at Dr. Elsie StainMarshall's office and normal per patient. Per patient has no history of an abnormal Pap smear. Last Pap smear result in [facility] was 11/02/2012.  Physical exam: Breasts Breasts symmetrical. No skin abnormalities bilateral breasts. No nipple retraction bilateral breasts. No nipple discharge bilateral breasts. Unable to express nipple discharge on exam. No lymphadenopathy. No lumps palpated bilateral breasts. Complaints of left outer breast pain on exam. [facility] and possible left breast ultrasound. Appointment scheduled for Thursday, April 09, 2016 at 1030.        Pelvic/Bimanual No Pap smear completed today since last Pap smear was in 2015 per patient. Pap smear not indicated per BCCCP guidelines.   Smoking History: Patient has never smoked.  Patient Navigation: Patient education provided. [facility]. Spanish interpreter provided.   Used Spanish interpreter [employer] Mor[employer] from [facility].

## 2016-04-10 ENCOUNTER — Ambulatory Visit
Admission: RE | Admit: 2016-04-10 | Discharge: 2016-04-10 | Disposition: A | Discharge status: Home / Self Care | Payer: No Typology Code available for payment source | Source: Ambulatory Visit | Attending: Obstetrics and Gynecology | Admitting: Obstetrics and Gynecology

## 2016-04-10 ENCOUNTER — Ambulatory Visit
Admission: RE | Admit: 2016-04-10 | Discharge: 2016-04-10 | Disposition: A | Discharge status: Home / Self Care | Payer: No Typology Code available for payment source | Source: Ambulatory Visit | Attending: Certified Nurse Midwife | Admitting: Certified Nurse Midwife

## 2016-04-10 DIAGNOSIS — N644 Mastodynia: Secondary | ICD-10-CM

## 2016-04-10 DIAGNOSIS — N6320 Unspecified lump in the left breast, unspecified quadrant: Secondary | ICD-10-CM

## 2016-04-10 DIAGNOSIS — N6452 Nipple discharge: Secondary | ICD-10-CM

## 2016-04-13 ENCOUNTER — Encounter (HOSPITAL_COMMUNITY): Payer: Self-pay | Admitting: *Deleted

## 2016-04-22 ENCOUNTER — Emergency Department (HOSPITAL_COMMUNITY): Payer: Self-pay

## 2016-04-22 ENCOUNTER — Emergency Department (HOSPITAL_COMMUNITY)
Admission: EM | Admit: 2016-04-22 | Discharge: 2016-04-22 | Disposition: A | Discharge status: Home / Self Care | Payer: Self-pay | Attending: Emergency Medicine | Admitting: Emergency Medicine

## 2016-04-22 ENCOUNTER — Encounter (HOSPITAL_COMMUNITY): Payer: Self-pay | Admitting: Emergency Medicine

## 2016-04-22 DIAGNOSIS — Z791 Long term (current) use of non-steroidal anti-inflammatories (NSAID): Secondary | ICD-10-CM | POA: Insufficient documentation

## 2016-04-22 DIAGNOSIS — R079 Chest pain, unspecified: Secondary | ICD-10-CM | POA: Insufficient documentation

## 2016-04-22 LAB — CBC
HEMATOCRIT: 35.5 % — AB (ref 36.0–46.0)
Hemoglobin: 10.8 g/dL — ABNORMAL LOW (ref 12.0–15.0)
MCH: 25.1 pg — ABNORMAL LOW (ref 26.0–34.0)
MCHC: 30.4 g/dL (ref 30.0–36.0)
MCV: 82.4 fL (ref 78.0–100.0)
Platelets: 264 10*3/uL (ref 150–400)
RBC: 4.31 MIL/uL (ref 3.87–5.11)
RDW: 16.9 % — AB (ref 11.5–15.5)
WBC: 7.9 10*3/uL (ref 4.0–10.5)

## 2016-04-22 LAB — I-STAT TROPONIN, ED: Troponin i, poc: 0 ng/mL (ref 0.00–0.08)

## 2016-04-22 LAB — BASIC METABOLIC PANEL
ANION GAP: 9 (ref 5–15)
BUN: 11 mg/dL (ref 6–20)
CO2: 28 mmol/L (ref 22–32)
Calcium: 8.8 mg/dL — ABNORMAL LOW (ref 8.9–10.3)
Chloride: 101 mmol/L (ref 101–111)
Creatinine, Ser: 0.48 mg/dL (ref 0.44–1.00)
GFR calc Af Amer: >60 mL/min (ref >60)
Glucose, Bld: 143 mg/dL — ABNORMAL HIGH (ref 65–99)
POTASSIUM: 4.4 mmol/L (ref 3.5–5.1)
SODIUM: 138 mmol/L (ref 135–145)

## 2016-04-22 MED ORDER — IBUPROFEN 600 MG PO TABS: 600.0000 mg | ORAL_TABLET | Freq: Three times a day (TID) | ORAL | 0 refills | Status: DC | PRN

## 2016-04-22 NOTE — ED Triage Notes (Signed)
Pt presents with shortness of breath and chest pain . denies Asthma , yet sister sts that pt was seen last month for chest pain and dyspnea with activity and dx with CHF yet no meds were prescribed per family. Pt was severely dyspneic when ambulating to triage . Alert and orienetd x 4. Language barrier and sister in translating. Pt is obese .

## 2016-04-22 NOTE — ED Notes (Signed)
EKG given to EDP,Goldston,.MD., for review. 

## 2016-04-22 NOTE — ED Provider Notes (Signed)
[facility]-EMERGENCY DEPT Provider Note   CSN: [id number] Arrival date & time: 04/22/16  1853     History   Chief Complaint Chief Complaint  Patient presents with  . Chest Pain    HPI Shelley Bradley is a 40 y.o. female.  The history is provided by the patient.  Chest Pain   Pertinent negatives include no abdominal pain, no cough and no shortness of breath.  Patient presents with left-sided chest pain. It is sharp and worse with movement. She's had for last 3 days. States difficulty with movement. States it feels different than the pain she was recently admitted for. States she was told she had blood clots in her lung. Reviewed records and she was too obese for definitive testing. Nondiagnostic CTA 2 and nondiagnostic Dopplers. Negative troponin but had positive d-dimer previously. Pain is worse with breathing but does not feel short of breath. Patient did have a fall a few days ago. States that when she woke up her foot was asleep and she fell down. Larey Seat forward and landed on her abdomen. States he did not hit her chest. Pain is on the left side of her chest and goes around to the back.  Past Medical History:  Diagnosis Date  . Morbid obesity (HCC)   . No pertinent past medical history   . Obese   . Sleep apnea in adult     Patient Active Problem List   Diagnosis Date Noted  . Cephalalgia   . Acute respiratory failure with hypoxia (HCC) 04/01/2016  . Breast pain   . Dyspnea   . Ovarian cyst 05/27/2015  . Morbid obesity due to excess calories (HCC) 12/06/2014    Past Surgical History:  Procedure Laterality Date  . NO PAST SURGERIES      OB History    Gravida Para Term Preterm AB Living   6 4 4  0 2 4   SAB TAB Ectopic Multiple Live Births   2 0 0 0 2       Home Medications    Prior to Admission medications   Medication Sig Start Date End Date Taking? Authorizing Provider  ibuprofen (ADVIL,MOTRIN) 600 MG tablet Take 1 tablet (600 mg total) by mouth every 8  (eight) hours as needed. 04/22/16   Benjiman Core, MD    Family History Family History  Problem Relation Age of Onset  . Heart disease Mother   . Diabetes Mother   . Diabetes Father   . Breast cancer Sister   . Breast cancer Paternal Grandmother   . Anesthesia problems Neg Hx   . Hypotension Neg Hx   . Malignant hyperthermia Neg Hx   . Pseudochol deficiency Neg Hx     Social History Social History  Substance Use Topics  . Smoking status: Never Smoker  . Smokeless tobacco: Never Used  . Alcohol use No     Allergies   Review of patient's allergies indicates no known allergies.   Review of Systems Review of Systems  Constitutional: Negative for appetite change.  HENT: Negative for congestion.   Respiratory: Negative for cough, chest tightness and shortness of breath.   Cardiovascular: Positive for chest pain.  Gastrointestinal: Negative for abdominal pain.     Physical Exam Updated Vital Signs BP 144/93   Pulse 100   Resp 24   LMP 03/07/2016 Comment: negative urie pregnancy test 03/31/16  SpO2 97%   Physical Exam  Constitutional:  Patient is morbidly obese. Patient's body habitus makes physical exam somewhat  difficult.  HENT:  Head: Atraumatic.  Eyes: EOM are normal.  Neck: Neck supple. No JVD present.  Cardiovascular: Normal rate.   Abdominal: There is no tenderness.  Musculoskeletal: She exhibits no edema.  Neurological: She is alert.  Skin: Skin is warm. Capillary refill takes less than 2 seconds.  Psychiatric: She has a normal mood and affect.     ED Treatments / Results  Labs (all labs ordered are listed, but only abnormal results are displayed) Labs Reviewed  BASIC METABOLIC PANEL - Abnormal; Notable for the following:       Result Value   Glucose, Bld 143 (*)    Calcium 8.8 (*)    All other components within normal limits  CBC - Abnormal; Notable for the following:    Hemoglobin 10.8 (*)    HCT 35.5 (*)    MCH 25.1 (*)    RDW 16.9 (*)      All other components within normal limits  I-STAT TROPOININ, ED    EKG  EKG Interpretation  Date/Time:  Wednesday April 22 2016 19:18:17 EDT Ventricular Rate:  101 PR Interval:    QRS Duration: 80 QT Interval:  322 QTC Calculation: 418 R Axis:   66 Text Interpretation:  Sinus tachycardia Low voltage, precordial leads Borderline T abnormalities, inferior leads Confirmed by Rubin PayorPICKERING  MD, Harrold DonathNATHAN [id number]) on 04/22/2016 7:41:15 PM       Radiology Dg Chest 2 View  Result Date: 04/22/2016 CLINICAL DATA:  40 year old female with shortness of breath and chest pain. Dyspnea on exertion. EXAM: CHEST  2 VIEW COMPARISON:  Chest x-ray a 03/31/2016. FINDINGS: Lung volumes are low and the film is underpenetrated related to the patient's large body habitus, limiting the diagnostic sensitivity and specificity of the examination. No consolidative airspace disease. No pleural effusions. No pneumothorax. No pulmonary nodule or mass noted. Pulmonary vasculature and the cardiomediastinal silhouette are within normal limits. IMPRESSION: No radiographic evidence of acute cardiopulmonary disease. Electronically Signed   By: Trudie Reedaniel  Entrikin M.D.   On: 04/22/2016 20:03    Procedures Procedures (including critical care time)  Medications Ordered in ED Medications - No data to display   Initial Impression / Assessment and Plan / ED Course  I have reviewed the triage vital signs and the nursing notes.  Pertinent labs & imaging results that were available during my care of the patient were reviewed by me and considered in my medical decision making (see chart for details).  Clinical Course    Patient with chest pain. Left-sided. Reproducible. Recent admission for chest pain and due to body habitus was unable to rule out pulmonary embolism. Not hypoxic here. EKG reassuring. X-ray no acute cardiopulmonary disease. Discussed with Dr. Tyson AliasFeinstein from critical care. At this point will not be able to rule  out pulmonary embolism completely but with the reproducibility of the pain and the fact she had a recent fall with the pain started after I think is more likely musculoskeletal. Discussed the patient and her family member that we are unable to rule out pulmonary embolism completely but thinks this is more likely chest wall. Will discharge home.  Final Clinical Impressions(s) / ED Diagnoses   Final diagnoses:  Chest pain, unspecified chest pain type    New Prescriptions Current Discharge Medication List       Benjiman CoreNathan Rebekah Sprinkle, MD 04/22/16 2158

## 2016-04-22 NOTE — ED Notes (Signed)
RN will start IV line and blood work

## 2016-05-06 ENCOUNTER — Encounter: Payer: Self-pay | Admitting: Internal Medicine

## 2016-05-06 ENCOUNTER — Ambulatory Visit (INDEPENDENT_AMBULATORY_CARE_PROVIDER_SITE_OTHER): Payer: Self-pay | Admitting: Internal Medicine

## 2016-05-06 VITALS — BP 140/90 | HR 110 | Ht 62.0 in | Wt >= 6400 oz

## 2016-05-06 DIAGNOSIS — R0600 Dyspnea, unspecified: Secondary | ICD-10-CM

## 2016-05-06 DIAGNOSIS — G4733 Obstructive sleep apnea (adult) (pediatric): Secondary | ICD-10-CM | POA: Insufficient documentation

## 2016-05-06 NOTE — Progress Notes (Signed)
Subjective:     Patient ID: Shelley Bradley, female   DOB: 03/07/1976, 40 y.o.   MRN: [id number]  HPI   40 yo [place] female  in [place] since 1996 and no travel back there since  ref[facility] pulmonary clinic [facility]   05/06/2016 1st [facility] office visit/ Wert   Chief Complaint  Patient presents with  . Pulmonary Consult    Self referral. Pt c/o non prod cough and SOB for the past 3 months. She gets winded walking from room to room.  wt p  Last IUP   April 2014 weighed 233 but progessive wt gain since then and then indolent onset sob in 2015 and gradually worse since then assoc with fatigue / sleepiness  since 2016 assoc with gen HA worst first thing in am and newly dx'd hypercarbia in ER 04/02/16 so referred to pulmonary by EDP with neg CTa/venous dopplers  Though both studies  technically limited   and no classically pleuritic cp/ productive cough   No obvious day to day or daytime variability or assoc   chest tightness, subjective wheeze or overt sinus or hb symptoms. No unusual exp hx or h/o childhood pna/ asthma or knowledge of premature birth.  Sleeping ok on side without nocturnal  or early am exacerbation  of respiratory  c/o's or need for noct saba. Also denies any obvious fluctuation of symptoms with weather or environmental changes or other aggravating or alleviating factors except as outlined above   Current Medications, Allergies, Complete Past Medical History, Past Surgical History, Family History, and Social History were reviewed in [facility] electronic medical record.  ROS  The following are not active complaints unless bolded sore throat, dysphagia, dental problems, itching, sneezing,  nasal congestion or excess/ purulent secretions, ear ache,   fever, chills, sweats, unintended wt loss, classically pleuritic or exertional cp, hemoptysis,  orthopnea pnd or leg swelling, presyncope, palpitations, abdominal pain, anorexia, nausea, vomiting,  diarrhea  or change in bowel or bladder habits, change in stools or urine, dysuria,hematuria,  rash, arthralgias, visual complaints, headache, numbness, weakness or ataxia or problems with walking or coordination,  change in mood/affect or Shelley.          Review of Systems     Objective:   Physical Exam    amb massively obese latino female nad   . Wt Readings from Last 3 Encounters:  05/06/16 (!) 468 lb (212.3 kg)  04/09/16 (!) 470 lb (213.2 kg)  04/03/16 (!) 470 lb 11.2 oz (213.5 kg)    Vital signs reviewed - - Note on arrival 02 sats  97% on RA      HEENT: nl dentition, turbinates, and oropharynx. Nl external ear canals without cough reflex   NECK :  without JVD/Nodes/TM/ nl carotid upstrokes bilaterally   LUNGS: no acc muscle use,  Nl contour chest which is clear to A and P bilaterally without cough on insp or exp maneuvers   CV:  RRR  no s3 or murmur or increase in P2, no edema   ABD:  soft and nontender with nl inspiratory excursion in the supine position. No bruits or organomegaly, bowel sounds nl  MS:  Nl gait/ ext warm without deformities, calf tenderness, cyanosis or clubbing No obvious joint restrictions   SKIN: warm and dry without lesions    NEURO:  alert, approp, nl sensorium with  no motor deficits       I personally reviewed images and  agree with radiology impression as follows:  CTa of  Chest     04/03/16  1. No infiltrate or pulmonary edema. Significant limited study by patient's large body habitus and breathing motion artifacts. The study is nondiagnostic for pulmonary embolus. 2. No mediastinal hematoma or adenopathy. Central airways are patent. 3. Fatty infiltration of the liver.   Labs ordered/ reviewed:      Chemistry      Component Value Date/Time   NA 138 04/22/2016 1931   K 4.4 04/22/2016 1931   CL 101 04/22/2016 1931   CO2 28 04/22/2016 1931   BUN 11 04/22/2016 1931   CREATININE 0.48 04/22/2016 1931      Component Value  Date/Time   CALCIUM 8.8 (L) 04/22/2016 1931   ALKPHOS 80 04/01/2016 0735   AST 36 04/01/2016 0735   ALT 35 04/01/2016 0735   BILITOT 0.7 04/01/2016 0735        Lab Results  Component Value Date   WBC 7.9 04/22/2016   HGB 10.8 (L) 04/22/2016   HCT 35.5 (L) 04/22/2016   MCV 82.4 04/22/2016   PLT 264 04/22/2016     Lab Results  Component Value Date   DDIMER 0.94 (H) 03/31/2016      Lab Results  Component Value Date   TSH 3.273 04/01/2016             Assessment:

## 2016-05-06 NOTE — Patient Instructions (Addendum)
Please see patient coordinator before you leave today  to schedule split night study and follow up with Dr DeDios next available after the scheduled study     Por favor vea al coordinador del paciente antes de que usted salga hoy para programar el estudio de la noche dividida y el seguimiento con el Dr. DeDios siguiente disponible despus del estudio programado

## 2016-05-07 ENCOUNTER — Encounter: Payer: Self-pay | Admitting: Internal Medicine

## 2016-05-07 NOTE — Assessment & Plan Note (Signed)
Split night study 05/06/2016 >>>  Clinically sounds like she has at least osa if not ohs > study should be done asap with f/u with  Dr Clayborn Bignessedios

## 2016-05-07 NOTE — Assessment & Plan Note (Signed)
Body mass index is 85.6  Lab Results  Component Value Date   TSH 3.273 04/01/2016     Contributing to gerd tendency/ doe/reviewed the need and the process to achieve and maintain neg calorie balance > defer f/u primary care including intermittently monitoring thyroid status

## 2016-05-07 NOTE — Assessment & Plan Note (Addendum)
04/01/16 neg CTa/venous dopplers  Though both studies  technically limited  05/06/2016   Walked RA x one lap @ 185 stopped due to  Sob/ sats 88% at very end well after she c/o sob at slow pace  Spirometry 05/06/2016  FEV1 2.03 (70%)  Ratio 89    She has already seen DeDios as inpt who independently arrived at the same imp/plan as I did :  Her problem is severe obesity thought does not apparently have chronic hypercarbia and desperately needs to lose wt/ get sleep study with probable bipap if qualifies and f/u with Dr DeDios as he intended on inpt consultation  Total time devoted to counseling  = 25/757m review case with pt/thru interpreter discussion of options/alternatives/ personally creating written instructions  in presence of pt  then going over those specific  Instructions directly with the pt including how to use all of the meds but in particular covering each new medication in detail and the difference between the maintenance/automatic meds and the prns using an action plan format for the latter.

## 2016-05-10 ENCOUNTER — Ambulatory Visit (HOSPITAL_BASED_OUTPATIENT_CLINIC_OR_DEPARTMENT_OTHER): Payer: Self-pay | Attending: Internal Medicine | Admitting: Pulmonary Disease

## 2016-05-10 VITALS — Ht 62.0 in | Wt >= 6400 oz

## 2016-05-10 DIAGNOSIS — J9611 Chronic respiratory failure with hypoxia: Secondary | ICD-10-CM

## 2016-05-10 DIAGNOSIS — G4733 Obstructive sleep apnea (adult) (pediatric): Secondary | ICD-10-CM | POA: Insufficient documentation

## 2016-05-10 DIAGNOSIS — J9612 Chronic respiratory failure with hypercapnia: Secondary | ICD-10-CM

## 2016-05-10 DIAGNOSIS — E662 Morbid (severe) obesity with alveolar hypoventilation: Secondary | ICD-10-CM

## 2016-05-13 ENCOUNTER — Telehealth: Payer: Self-pay | Admitting: *Deleted

## 2016-05-13 DIAGNOSIS — E662 Morbid (severe) obesity with alveolar hypoventilation: Secondary | ICD-10-CM | POA: Insufficient documentation

## 2016-05-13 DIAGNOSIS — G4733 Obstructive sleep apnea (adult) (pediatric): Secondary | ICD-10-CM

## 2016-05-13 DIAGNOSIS — J9610 Chronic respiratory failure, unspecified whether with hypoxia or hypercapnia: Secondary | ICD-10-CM | POA: Insufficient documentation

## 2016-05-13 NOTE — Telephone Encounter (Signed)
-----   Message from Nyoka CowdenMichael B Wert, MD sent at 05/13/2016  1:25 PM EDT ----- - Trial of BiPAP therapy on 25/21 cm H2O with 2 liters supplemental oxygen. - She was fitted with a Small size Resmed Full Face Mask AirFit F20 mask and heated humidification. F/u Sood next avail new pt for osa ----- Message ----- From: Coralyn HellingVineet Sood, MD Sent: 05/13/2016  12:46 PM To: Nyoka CowdenMichael B Wert, MD

## 2016-05-13 NOTE — Telephone Encounter (Signed)
LMTCB

## 2016-05-13 NOTE — Procedures (Signed)
Patient Name: Shelley Bradley, Shelley Bradley Study Date: 05/10/2016 Gender: Female D.O.B: June 01, 1976 Age (years): 39 Referring Provider: Tanda Rockers Height (inches): 5 Interpreting Physician: Chesley Mires MD, ABSM Weight (lbs): 468 RPSGT: Zadie Rhine BMI: 86 MRN: [id number] Neck Size: 19.00  CLINICAL INFORMATION The patient is referred for a split night study with BPAP. MEDICATIONS Medications self-administered by patient taken the night of the study : N/A  SLEEP STUDY TECHNIQUE As per the AASM Manual for the Scoring of Sleep and Associated Events v2.3 (April 2016) with a hypopnea requiring 4% desaturations. The channels recorded and monitored were frontal, central and occipital EEG, electrooculogram (EOG), submentalis EMG (chin), nasal and oral airflow, thoracic and abdominal wall motion, anterior tibialis EMG, snore microphone, electrocardiogram, and pulse oximetry. Bi-level positive airway pressure (BiPAP) was initiated when the patient met split night criteria and was titrated according to treat sleep-disordered breathing.  RESPIRATORY PARAMETERS Diagnostic Total AHI (/hr): 180.2 RDI (/hr): 182.1 OA Index (/hr): 155.1 CA Index (/hr): 3.3 REM AHI (/hr): 129.8 NREM AHI (/hr): 196.3 Supine AHI (/hr): N/A Non-supine AHI (/hr): 182.13 Min O2 Sat (%): 51.00 Mean O2 (%): 79.15 Time below 88% (min): 94.0     Titration Optimal IPAP Pressure (cm): 25 Optimal EPAP Pressure (cm): 21 AHI at Optimal Pressure (/hr): 6.6 Min O2 at Optimal Pressure (%): 66.0 Sleep % at Optimal (%): 81 Supine % at Optimal (%): 0           She had persistent hypoxia lasting more that 5 minutes after she had adequate control of sleep apnea with Bipap.  She required addition of 2 liters supplemental oxygen with Bipap.   SLEEP ARCHITECTURE The study was initiated at 9:58:01 PM and terminated at 5:13:01 AM. The total recorded time was 435.0 minutes. EEG confirmed total sleep time was 333.5 minutes yielding a sleep  efficiency of 76.7%. Sleep onset after lights out was 3.9 minutes with a REM latency of 158.5 minutes. The patient spent 5.40% of the night in stage N1 sleep, 61.47% in stage N2 sleep, 0.60% in stage N3 and 32.53% in REM. Wake after sleep onset (WASO) was 97.6 minutes. The Arousal Index was 117.8/hour.  LEG MOVEMENT DATA The total Periodic Limb Movements of Sleep (PLMS) were 0. The PLMS index was 0.00 .  CARDIAC DATA The 2 lead EKG demonstrated sinus rhythm. The mean heart rate was 97.97 beats per minute. Other EKG findings include: None.  IMPRESSIONS - This study shows very severe obstructive sleep apnea with an AHI of 180.2 and SaO2 low of 51%. - She had improvement in her sleep apnea with Bipap at 25/21 cm H2O.   - She had continued oxygen desaturaton in spite of adequate control of sleep apnea with Bipap.  This was improved with 2 liters supplemental oxygen added to Bipap therapy.  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 - She was fitted with a Small size Resmed Full Face Mask AirFit F20 mask and heated humidification. - Avoid alcohol, sedatives and other CNS depressants that may worsen sleep apnea and disrupt normal sleep architecture. - Sleep hygiene should be reviewed to assess factors that may improve sleep quality. - Weight management and regular exercise should be initiated or continued.  [Electronically signed] 05/13/2016 12:45 PM  Chesley Mires MD, ABSM Diplomate, American Board of Sleep Medicine  NPI: [id number]

## 2016-05-19 ENCOUNTER — Ambulatory Visit: Payer: Self-pay | Admitting: Family Medicine

## 2016-05-27 NOTE — Telephone Encounter (Signed)
Pt scheduled with AD 06/24/16

## 2016-06-17 ENCOUNTER — Ambulatory Visit: Payer: Self-pay | Admitting: Family Medicine

## 2016-06-24 ENCOUNTER — Institutional Professional Consult (permissible substitution): Payer: Self-pay | Admitting: Pulmonary Disease

## 2016-08-10 ENCOUNTER — Encounter: Payer: Self-pay | Admitting: Family Medicine

## 2016-08-10 ENCOUNTER — Other Ambulatory Visit: Payer: Self-pay | Admitting: Family Medicine

## 2016-08-10 ENCOUNTER — Ambulatory Visit (INDEPENDENT_AMBULATORY_CARE_PROVIDER_SITE_OTHER): Payer: Self-pay | Admitting: Family Medicine

## 2016-08-10 DIAGNOSIS — G4733 Obstructive sleep apnea (adult) (pediatric): Secondary | ICD-10-CM

## 2016-08-10 DIAGNOSIS — E8881 Metabolic syndrome: Secondary | ICD-10-CM

## 2016-08-10 DIAGNOSIS — N912 Amenorrhea, unspecified: Secondary | ICD-10-CM

## 2016-08-10 DIAGNOSIS — I10 Essential (primary) hypertension: Secondary | ICD-10-CM

## 2016-08-10 DIAGNOSIS — Z789 Other specified health status: Secondary | ICD-10-CM

## 2016-08-10 DIAGNOSIS — E119 Type 2 diabetes mellitus without complications: Secondary | ICD-10-CM

## 2016-08-10 LAB — COMPLETE METABOLIC PANEL WITH GFR
ALBUMIN: 3.6 g/dL (ref 3.6–5.1)
ALK PHOS: 86 U/L (ref 33–115)
ALT: 35 U/L — ABNORMAL HIGH (ref 6–29)
AST: 37 U/L — ABNORMAL HIGH (ref 10–30)
BUN: 7 mg/dL (ref 7–25)
CALCIUM: 8.9 mg/dL (ref 8.6–10.2)
CO2: 28 mmol/L (ref 20–31)
Chloride: 97 mmol/L — ABNORMAL LOW (ref 98–110)
Creat: 0.52 mg/dL (ref 0.50–1.10)
GFR, Est African American: >89 mL/min (ref >=60)
GLUCOSE: 167 mg/dL — AB (ref 65–99)
POTASSIUM: 4.2 mmol/L (ref 3.5–5.3)
Sodium: 138 mmol/L (ref 135–146)
Total Bilirubin: 0.3 mg/dL (ref 0.2–1.2)
Total Protein: 7.3 g/dL (ref 6.1–8.1)

## 2016-08-10 LAB — CBC WITH DIFFERENTIAL/PLATELET
BASOS ABS: 0 {cells}/uL (ref 0–200)
Basophils Relative: 0 %
EOS PCT: 2 %
Eosinophils Absolute: 152 cells/uL (ref 15–500)
HCT: 37.4 % (ref 35.0–45.0)
HEMOGLOBIN: 11.2 g/dL — AB (ref 11.7–15.5)
LYMPHS ABS: 1824 {cells}/uL (ref 850–3900)
Lymphocytes Relative: 24 %
MCH: 23.2 pg — AB (ref 27.0–33.0)
MCHC: 29.9 g/dL — AB (ref 32.0–36.0)
MCV: 77.6 fL — ABNORMAL LOW (ref 80.0–100.0)
MONOS PCT: 6 %
MPV: 9.4 fL (ref 7.5–12.5)
Monocytes Absolute: 456 cells/uL (ref 200–950)
NEUTROS PCT: 68 %
Neutro Abs: 5168 cells/uL (ref 1500–7800)
PLATELETS: 310 10*3/uL (ref 140–400)
RBC: 4.82 MIL/uL (ref 3.80–5.10)
RDW: 16.9 % — ABNORMAL HIGH (ref 11.0–15.0)
WBC: 7.6 10*3/uL (ref 3.8–10.8)

## 2016-08-10 LAB — TSH: TSH: 3.02 m[IU]/L

## 2016-08-10 LAB — POCT URINE PREGNANCY: PREG TEST UR: NEGATIVE

## 2016-08-10 LAB — POCT GLYCOSYLATED HEMOGLOBIN (HGB A1C): Hemoglobin A1C: 7.7

## 2016-08-10 MED ORDER — CLONIDINE HCL 0.1 MG PO TABS: 0.2000 mg | ORAL_TABLET | Freq: Once | ORAL | Status: DC

## 2016-08-10 MED ORDER — METFORMIN HCL 500 MG PO TABS: 500.0000 mg | ORAL_TABLET | Freq: Two times a day (BID) | ORAL | 3 refills | Status: DC

## 2016-08-10 MED ORDER — HYDROCHLOROTHIAZIDE 12.5 MG PO TABS: 12.5000 mg | ORAL_TABLET | Freq: Every day | ORAL | 5 refills | Status: DC

## 2016-08-10 MED ORDER — CLONIDINE HCL 0.1 MG PO TABS
0.2000 mg | ORAL_TABLET | Freq: Every day | ORAL | Status: DC
  Administered 2016-08-10: 0.2 mg via ORAL

## 2016-08-10 NOTE — Progress Notes (Signed)
Subjective:    Patient ID: Shelley Bradley, female    DOB: 05/22/1976, 41 y.o.   MRN: [id number]  HPI  [name], a 41 year old female with a history of obstructive sleep apnea and morbid obesity presents to establish care. Patient has not had a primary provider. Patient was referred to pulmonology by the emergency department for shortness of breath. She recently had a sleep study and was diagnosed with OSA.  [name] has a markedly elevated blood pressure upon arrival. Blood pressure is 192/95. She has not been following a low fat, low sodium diet or exercising.   Patient denies chest pain, dyspnea, fatigue, irregular heart beat, orthopnea, palpitations and tachypnea.  Cardiovascular risk factors include: obesity (BMI >= 30 kg/m2) and sedentary lifestyle.  [name] also has a hemoglobin a1c of 7.7, which is consistent with type 2 diabetes mellitus. . Patient denies foot ulcerations, nausea, paresthesia of the feet, polydipsia, polyuria and visual disturbances.  Evaluation to date has been included: fasting blood sugar and fasting lipid panel.   Past Medical History:  Diagnosis Date  . Morbid obesity (HCC)   . No pertinent past medical history   . Obese   . Sleep apnea in adult    Immunization History  Administered Date(s) Administered  . Tdap 10/29/2011, 11/23/2012   Social History   Social History  . Marital status: Married    Spouse name: N/A  . Number of children: N/A  . Years of education: N/A   Occupational History  . Not on file.   Social History Main Topics  . Smoking status: Never Smoker  . Smokeless tobacco: Never Used  . Alcohol use No  . Drug use: No  . Sexual activity: Yes    Birth control/ protection: None   Other Topics Concern  . Not on file   Social History Narrative  . No narrative on file   Review of Systems  Constitutional: Positive for fatigue and unexpected weight change (weight gain).  HENT: Negative.   Eyes: Negative for visual  disturbance.  Respiratory: Positive for shortness of breath and wheezing.   Cardiovascular: Positive for chest pain and palpitations.  Gastrointestinal: Negative.   Endocrine: Positive for polydipsia, polyphagia and polyuria.  Genitourinary: Negative.   Musculoskeletal: Negative.   Skin: Negative.   Allergic/Immunologic: Negative for immunocompromised state.  Neurological: Negative.   Hematological: Negative.   Psychiatric/Behavioral: Negative.        Objective:   Physical Exam  Constitutional: She is oriented to person, place, and time. She appears well-developed and well-nourished.  Morbid obesity  HENT:  Head: Normocephalic and atraumatic.  Right Ear: External ear normal.  Left Ear: External ear normal.  Nose: Nose normal.  Mouth/Throat: Oropharynx is clear and moist.  Eyes: Conjunctivae and EOM are normal. Pupils are equal, round, and reactive to light.  Neck: Normal range of motion. Neck supple.  Cardiovascular: Normal rate, regular rhythm, normal heart sounds and intact distal pulses.   Pulmonary/Chest: Effort normal and breath sounds normal.  Abdominal: Soft. Bowel sounds are normal.  Abdominal obesity  Musculoskeletal: Normal range of motion.  Neurological: She is alert and oriented to person, place, and time. She has normal reflexes.  Skin: Skin is warm and dry.  Psychiatric: She has a normal mood and affect. Her behavior is normal. Judgment and thought content normal.      BP (!) 142/78 (BP Location: Right Arm, Patient Position: Sitting, Cuff Size: Large)   Pulse (!) 101  Temp 98.3 F (36.8 C) (Oral)   Resp (!) 24   Ht 5\' 2"  (1.575 m)   Wt (!) 473 lb (214.6 kg)   LMP 05/20/2016   SpO2 96%   BMI 86.51 kg/m  Assessment & Plan:  1. Accelerated hypertension Blood pressure decreased to 142/78 30 minutes after receiving Clonidine 0.2 mg. Will start antihypertensive medication. Will return in 1 week for a blood pressure check.  - hydrochlorothiazide  (HYDRODIURIL) 12.5 MG tablet; Take 1 tablet (12.5 mg total) by mouth daily.  Dispense: 30 tablet; Refill: 5 - cloNIDine (CATAPRES) tablet 0.2 mg; Take 2 tablets (0.2 mg total) by mouth once.  2. Type 2 diabetes mellitus without complication, without long-term current use of insulin (HCC) Hemoglobin a1C is 7.7. Will start Metformin 500 mg BID and a carbohydrate modified diet.  - metFORMIN (GLUCOPHAGE) 500 MG tablet; Take 1 tablet (500 mg total) by mouth 2 (two) times daily with a meal.  Dispense: 180 tablet; Refill: 3 - Microalbumin/Creatinine Ratio, Urine  3. Morbid (severe) obesity due to excess calories (HCC) Recommend a lowfat, low carbohydrate diet divided over 5-6 small meals, increase water intake to 6-8 glasses, and 150 minutes per week of cardiovascular exercise.    4. OSA (obstructive sleep apnea) Patient has a follow up appointment with Dr. Christene Slates on 09/22/2016.   5. Abdominal obesity and metabolic syndrome Provided written information in spanish on metabolic syndrome diet.  - HgB A1c - COMPLETE METABOLIC PANEL WITH GFR - CBC with Differential - TSH  6. Amenorrhea  - POCT urine pregnancy  7. Language barrier to communication Used video interpreter to assist with communication   RTC: 1 week for bp check and 1 month for hypertension and DMII Bolton Canupp M, FNP

## 2016-08-10 NOTE — Patient Instructions (Addendum)
Controle su presin arterial (Managing Your Hypertension) La presin arterial es la medida de la fuerza de la sangre al presionar contra las paredes de las arterias. Las arterias son tubos musculares que estn dentro del sistema circulatorio. La presin arterial no es constante. Se eleva con la actividad, la excitacin o el nerviosismo y disminuye durante el sueo y Advice worker. Si los valores de medicin de la presin arterial se mantienen por arriba de lo normal por [employer], hay riesgo de tener problemas de salud. La presin arterial alta (hipertensin) es una enfermedad de larga duracin (crnica) en la que la presin arterial est elevada.  La lectura de la presin arterial se registra con dos nmeros, por ejemplo 120 sobre 80 (o 120/80). El primer nmero, el ms alto, es la presin sistlica. Es la medida de la presin de las arterias cuando el corazn late. El segundo nmero, el ms bajo, es la presin diastlica. Es la medida de la presin en las arterias cuando el corazn se relaja entre latidos.  Es importante Consulting civil engineer presin arterial en un rango normal para Risk manager en general y 77 problemas de salud, como enfermedades del Kendrick e ictus. Cuando no se controla la presin arterial, el corazn trabaja ms de lo normal. La hipertensin arterial es una enfermedad muy comn en los adultos debido a que tiende a Garment/textile technologist con la edad. Hombres y mujeres son igualmente propensos a tener hipertensin, pero en diferentes momentos de la vida. Antes de los 73 aos, los hombres son ms propensos a sufrir hipertensin. Despus de 65 aos de edad, las mujeres tienen ms probabilidades de padecerla. La hipertensin es [employer] afroamericanos. Esta enfermedad generalmente no manifiesta signos ni sntomas. Generalmente se desconoce la causa. El mdico podr indicarle un plan para mantener la presin arterial en un rango normal y saludable.  ETAPAS DE PRESIN ARTERIAL  La presin arterial  se clasifica en cuatro etapas: normal, prehipertensin, etapa 1 y etapa 2. Se puede leer la presin arterial para determinar qu tipo de tratamiento, si se indicara, es necesario. Las opciones apropiadas para el tratamiento estn vinculadas a estas cuatro etapas:  Normal   Presin sistlica (mm Hg): por debajo de 120.  Presin diastlica (mm Hg): por debajo de 80. Prehipertensin   Presin sistlica (mm Hg): 123456 a XX123456.  Presin diastlica (mm Hg): 80 a 89. Etapa1   Presin sistlica (mm Hg): XX123456 a [id number].  Presin diastlica (mm Hg): 90 a 99. Etapa2   Presin sistlica (mm Hg): 0000000 o ms.  Presin diastlica (mm Hg): 123XX123 o ms. RIESGOS RELACIONADOS CON LA PRESIN ARTERIAL ALTA  Controlar la presin arterial es una responsabilidad importante. La hipertensin no controlada puede llevar a:   Ataques cardacos.  Ictus.  Debilitamiento de los vasos sanguneos (aneurisma).  Insuficiencia cardaca.  Dao renal.  Dao ocular.  Sndrome metablico.  Problemas de memoria y concentracin. CMO CONTROLAR LA PRESIN ARTERIAL  La presin arterial puede ser controlada efectivamente con cambios en el estilo de vida y de medicamentos (si es necesario). El Buyer, retail un plan para bajar la presin arterial al rango normal. Su plan debera incluir lo siguiente:  Educacin   Lea toda la informacin proporcionada por sus mdicos acerca de cmo controlar la presin arterial.  Infrmese sobre las ltimas recomendaciones de pautas y [place]. Continuamente se hacen nuevas investigaciones para definir con ms precisin los riesgos y los tratamientos para la hipertensin arterial. Cambiosen el estilo de vida  Control del  peso.  No fumar.  Mantenerse fsicamente activo.  Disminuir la cantidad de sal de la dieta.  Reducir las situaciones de estrs.  Controlar las enfermedades crnicas, como el colesterol alto o la diabetes.  Reducir el consumo de alcohol. Medicamentos  Estn  disponibles diferentes medicamentos (medicamentos antihipertensivos) para que la presin arterial quede dentro de un rango normal. Comunicacin   Revise con su mdico todos los medicamentos que toma ya que puede haber efectos secundarios o interacciones.  Hable con su mdico acerca de la dieta, hbitos de ejercicio y otros factores del estilo de vida que pueden contribuir a la hipertensin arterial.  Oceanographer regularmente a la consulta con el profesional. El mdico puede ayudarle a crear y Dawayne Patricia su plan para controlar la presin arterial alta. RECOMENDACIONES PARA EL TRATAMIENTO Y CONTROL  Las siguientes recomendaciones se basan en las pautas actuales para controlar la hipertensin arterial en adultos no gestantes. Utilice estas recomendaciones para determinar el perodo de seguimiento adecuado o la opcin de tratamiento basada en la lectura de su presin arterial. Podr conversar sobre estas opciones con su mdico.   Presin sistlica de 120 a 139 o presin diastlica de 80 a 89: Concurra a las visitas de control, segn las indicaciones.  Presin sistlica de 140 a 160 o presin diastlica de 90 a 100: Haga una visita de control con el profesional dentro de [address]  Presin sistlica por arriba de 160 o presin diastlica por arriba de 100: Haga una visita de control con el profesional dentro de un mes.  Presin sistlica por arriba de 180 o presin diastlica por arriba de 110: Considere la posibilidad de seguir una terapia antihipertensiva; concurra a una visita de control con su mdico dentro de 1 semana.  Presin sistlica por arriba de 200 o presin diastlica por arriba de 120: Comience el tratamiento antihipertensivo; concurra una visita de control con su mdico dentro de 1 semana. Esta informacin no tiene Theme park manager el consejo del mdico. Asegrese de hacerle al mdico cualquier pregunta que tenga. Document Released: 04/06/2012 Elsevier Interactive Patient Education   2017 [employer].  Fat and Cholesterol Restricted Diet Introduction Getting too much fat and cholesterol in your diet may cause health problems. Following this diet helps keep your fat and cholesterol at normal levels. This can keep you from getting sick. What types of fat should I choose?  Choose monosaturated and polyunsaturated fats. These are found in foods such as olive oil, canola oil, flaxseeds, walnuts, almonds, and seeds.  Eat more omega-3 fats. Good choices include salmon, mackerel, sardines, tuna, flaxseed oil, and ground flaxseeds.  Limit saturated fats. These are in animal products such as meats, butter, and cream. They can also be in plant products such as palm oil, palm kernel oil, and coconut oil.  Avoid foods with partially hydrogenated oils in them. These contain trans fats. Examples of foods that have trans fats are stick margarine, some tub margarines, cookies, crackers, and other baked goods. What general guidelines do I need to follow?  Check food labels. Look for the words "trans fat" and "saturated fat."  When preparing a meal:  Fill half of your plate with vegetables and green salads.  Fill one fourth of your plate with whole grains. Look for the word "whole" as the first word in the ingredient list.  Fill one fourth of your plate with lean protein foods.  Eat more foods that have fiber, like apples, carrots, beans, peas, and barley.  Eat more home-cooked foods.  Eat less at restaurants and buffets.  Limit or avoid alcohol.  Limit foods high in starch and sugar.  Limit fried foods.  Cook foods without frying them. Baking, boiling, grilling, and broiling are all great options.  Lose weight if you are overweight. Losing even a small amount of weight can help your overall health. It can also help prevent diseases such as diabetes and heart disease. What foods can I eat? Grains  Whole grains, such as whole wheat or whole grain breads, crackers, cereals,  and pasta. Unsweetened oatmeal, bulgur, barley, quinoa, or brown rice. Corn or whole wheat flour tortillas. Vegetables  Fresh or frozen vegetables (raw, steamed, roasted, or grilled). Green salads. Fruits  All fresh, canned (in natural juice), or frozen fruits. Meat and Other Protein Products  Ground beef (85% or leaner), grass-fed beef, or beef trimmed of fat. Skinless chicken or [place]. Ground chicken or [place]. Pork trimmed of fat. All fish and seafood. Eggs. Dried beans, peas, or lentils. Unsalted nuts or seeds. Unsalted canned or dry beans. Dairy  Low-fat dairy products, such as skim or 1% milk, 2% or reduced-fat cheeses, low-fat ricotta or cottage cheese, or plain low-fat yogurt. Fats and Oils  Tub margarines without trans fats. Light or reduced-fat mayonnaise and salad dressings. Avocado. Olive, canola, sesame, or safflower oils. Natural peanut or almond butter (choose ones without added sugar and oil). The items listed above may not be a complete list of recommended foods or beverages. Contact your dietitian for more options.  What foods are not recommended? Grains  White bread. White pasta. White rice. Cornbread. Bagels, pastries, and croissants. Crackers that contain trans fat. Vegetables  White potatoes. Corn. Creamed or fried vegetables. Vegetables in a cheese sauce. Fruits  Dried fruits. Canned fruit in light or heavy syrup. Fruit juice. Meat and Other Protein Products  Fatty cuts of meat. Ribs, chicken wings, bacon, sausage, bologna, salami, chitterlings, fatback, hot dogs, bratwurst, and packaged luncheon meats. Liver and organ meats. Dairy  Whole or 2% milk, cream, half-and-half, and cream cheese. Whole milk cheeses. Whole-fat or sweetened yogurt. Full-fat cheeses. Nondairy creamers and whipped toppings. Processed cheese, cheese spreads, or cheese curds. Sweets and Desserts  Corn syrup, sugars, honey, and molasses. Candy. Jam and jelly. Syrup. Sweetened cereals. Cookies, pies,  cakes, donuts, muffins, and ice cream. Fats and Oils  Butter, stick margarine, lard, shortening, ghee, or bacon fat. Coconut, palm kernel, or palm oils. Beverages  Alcohol. Sweetened drinks (such as sodas, lemonade, and fruit drinks or punches). The items listed above may not be a complete list of foods and beverages to avoid. Contact your dietitian for more information.  This information is not intended to replace advice given to you by your health care provider. Make sure you discuss any questions you have with your health care provider. Document Released: 01/12/2012 Document Revised: 03/19/2016 Document Reviewed: 10/12/2013  2017 Elsevier  Heart-Healthy Eating Plan Introduction Heart-healthy meal planning includes:  Limiting unhealthy fats.  Increasing healthy fats.  Making other small dietary changes. You may need to talk with your doctor or a diet specialist (dietitian) to create an eating plan that is right for you. What types of fat should I choose?  Choose healthy fats. These include olive oil and canola oil, flaxseeds, walnuts, almonds, and seeds.  Eat more omega-3 fats. These include salmon, mackerel, sardines, tuna, flaxseed oil, and ground flaxseeds. Try to eat fish at least twice each week.  Limit saturated fats.  Saturated fats are often found in animal  products, such as meats, butter, and cream.  Plant sources of saturated fats include palm oil, palm kernel oil, and coconut oil.  Avoid foods with partially hydrogenated oils in them. These include stick margarine, some tub margarines, cookies, crackers, and other baked goods. These contain trans fats. What general guidelines do I need to follow?  Check food labels carefully. Identify foods with trans fats or high amounts of saturated fat.  Fill one half of your plate with vegetables and green salads. Eat 4-5 servings of vegetables per day. A serving of vegetables is:  1 cup of raw leafy vegetables.   cup of  raw or cooked cut-up vegetables.   cup of vegetable juice.  Fill one fourth of your plate with whole grains. Look for the word "whole" as the first word in the ingredient list.  Fill one fourth of your plate with lean protein foods.  Eat 4-5 servings of fruit per day. A serving of fruit is:  One medium whole fruit.   cup of dried fruit.   cup of fresh, frozen, or canned fruit.   cup of 100% fruit juice.  Eat more foods that contain soluble fiber. These include apples, broccoli, carrots, beans, peas, and barley. Try to get 20-30 g of fiber per day.  Eat more home-cooked food. Eat less restaurant, buffet, and fast food.  Limit or avoid alcohol.  Limit foods high in starch and sugar.  Avoid fried foods.  Avoid frying your food. Try baking, boiling, grilling, or broiling it instead. You can also reduce fat by:  Removing the skin from poultry.  Removing all visible fats from meats.  Skimming the fat off of stews, soups, and gravies before serving them.  Steaming vegetables in water or broth.  Lose weight if you are overweight.  Eat 4-5 servings of nuts, legumes, and seeds per week:  One serving of dried beans or legumes equals  cup after being cooked.  One serving of nuts equals 1 ounces.  One serving of seeds equals  ounce or one tablespoon.  You may need to keep track of how much salt or sodium you eat. This is especially true if you have high blood pressure. Talk with your doctor or dietitian to get more information. What foods can I eat? Grains  Breads, including [place], white, pita, wheat, raisin, rye, oatmeal, and [place]. Tortillas that are neither fried nor made with lard or trans fat. Low-fat rolls, including hotdog and hamburger buns and English muffins. Biscuits. Muffins. Waffles. Pancakes. Light popcorn. Whole-grain cereals. Flatbread. Melba toast. Pretzels. Breadsticks. Rusks. Low-fat snacks. Low-fat crackers, including oyster, saltine, matzo, graham,  animal, and rye. Rice and pasta, including brown rice and pastas that are made with whole wheat. Vegetables  All vegetables. Fruits  All fruits, but limit coconut. Meats and Other Protein Sources  Lean, well-trimmed beef, veal, pork, and lamb. Chicken and [place] without skin. All fish and shellfish. Wild duck, rabbit, pheasant, and venison. Egg whites or low-cholesterol egg substitutes. Dried beans, peas, lentils, and tofu. Seeds and most nuts. Dairy  Low-fat or nonfat cheeses, including ricotta, string, and mozzarella. Skim or 1% milk that is liquid, powdered, or evaporated. Buttermilk that is made with low-fat milk. Nonfat or low-fat yogurt. Beverages  Mineral water. Diet carbonated beverages. Sweets and Desserts  Sherbets and fruit ices. Honey, jam, marmalade, jelly, and syrups. Meringues and gelatins. Pure sugar candy, such as hard candy, jelly beans, gumdrops, mints, marshmallows, and small amounts of dark chocolate. [employer]. Eat  all sweets and desserts in moderation. Fats and Oils  Nonhydrogenated (trans-free) margarines. Vegetable oils, including soybean, sesame, sunflower, olive, peanut, safflower, corn, canola, and cottonseed. Salad dressings or mayonnaise made with a vegetable oil. Limit added fats and oils that you use for cooking, baking, salads, and as spreads. Other  Cocoa powder. Coffee and tea. All seasonings and condiments. The items listed above may not be a complete list of recommended foods or beverages. Contact your dietitian for more options.  What foods are not recommended? Grains  Breads that are made with saturated or trans fats, oils, or whole milk. Croissants. Butter rolls. Cheese breads. Sweet rolls. Donuts. Buttered popcorn. Chow mein noodles. High-fat crackers, such as cheese or butter crackers. Meats and Other Protein Sources  Fatty meats, such as hotdogs, short ribs, sausage, spareribs, bacon, rib eye roast or steak, and mutton. High-fat deli meats, such  as salami and bologna. Caviar. Domestic duck and goose. Organ meats, such as kidney, liver, sweetbreads, and heart. Dairy  Cream, sour cream, cream cheese, and creamed cottage cheese. Whole-milk cheeses, including blue (bleu), [address], [place], [place], [address], [place], [address], cheddar, [place]embert, and [place]. Whole or 2% milk that is liquid, evaporated, or condensed. Whole buttermilk. Cream sauce or high-fat cheese sauce. Yogurt that is made from whole milk. Beverages  Regular sodas and juice drinks with added sugar. Sweets and Desserts  Frosting. Pudding. Cookies. Cakes other than angel food cake. Candy that has milk chocolate or white chocolate, hydrogenated fat, butter, coconut, or unknown ingredients. Buttered syrups. Full-fat ice cream or ice cream drinks. Fats and Oils  Gravy that has suet, meat fat, or shortening. Cocoa butter, hydrogenated oils, palm oil, coconut oil, palm kernel oil. These can often be found in baked products, candy, fried foods, nondairy creamers, and whipped toppings. Solid fats and shortenings, including bacon fat, salt pork, lard, and butter. Nondairy cream substitutes, such as coffee creamers and sour cream substitutes. Salad dressings that are made of unknown oils, cheese, or sour cream. The items listed above may not be a complete list of foods and beverages to avoid. Contact your dietitian for more information.  This information is not intended to replace advice given to you by your health care provider. Make sure you discuss any questions you have with your health care provider. Document Released: 01/12/2012 Document Revised: 12/19/2015 Document Reviewed: 01/04/2014  2017 Elsevier

## 2016-08-11 LAB — MICROALBUMIN / CREATININE URINE RATIO
Creatinine, Urine: 110 mg/dL (ref 20–320)
MICROALB UR: 14.2 mg/dL
MICROALB/CREAT RATIO: 129 ug/mg{creat} — AB (ref <30)

## 2016-08-11 LAB — POCT URINALYSIS DIP (DEVICE)
BILIRUBIN URINE: NEGATIVE
GLUCOSE, UA: NEGATIVE mg/dL
Ketones, ur: NEGATIVE mg/dL
NITRITE: POSITIVE — AB
PH: 7.5 (ref 5.0–8.0)
Protein, ur: 100 mg/dL — AB
Specific Gravity, Urine: 1.02 (ref 1.005–1.030)
Urobilinogen, UA: 1 mg/dL (ref 0.0–1.0)

## 2016-08-12 DIAGNOSIS — Z789 Other specified health status: Secondary | ICD-10-CM | POA: Insufficient documentation

## 2016-08-12 DIAGNOSIS — I10 Essential (primary) hypertension: Secondary | ICD-10-CM | POA: Insufficient documentation

## 2016-08-12 DIAGNOSIS — E119 Type 2 diabetes mellitus without complications: Secondary | ICD-10-CM | POA: Insufficient documentation

## 2016-08-12 DIAGNOSIS — E8881 Metabolic syndrome: Secondary | ICD-10-CM | POA: Insufficient documentation

## 2016-08-12 DIAGNOSIS — E669 Obesity, unspecified: Secondary | ICD-10-CM | POA: Insufficient documentation

## 2016-08-12 LAB — HEPATITIS PANEL, ACUTE
HCV Ab: NEGATIVE
HEP A IGM: NONREACTIVE
HEP B C IGM: NONREACTIVE
HEP B S AG: NEGATIVE

## 2016-08-24 ENCOUNTER — Telehealth (HOSPITAL_COMMUNITY): Payer: Self-pay | Admitting: *Deleted

## 2016-08-24 NOTE — Telephone Encounter (Signed)
Telephoned patient at home number and no answer. 

## 2016-09-11 ENCOUNTER — Ambulatory Visit (INDEPENDENT_AMBULATORY_CARE_PROVIDER_SITE_OTHER): Payer: Self-pay | Admitting: Family Medicine

## 2016-09-11 ENCOUNTER — Encounter: Payer: Self-pay | Admitting: Family Medicine

## 2016-09-11 DIAGNOSIS — E119 Type 2 diabetes mellitus without complications: Secondary | ICD-10-CM

## 2016-09-11 DIAGNOSIS — I10 Essential (primary) hypertension: Secondary | ICD-10-CM

## 2016-09-11 DIAGNOSIS — Z789 Other specified health status: Secondary | ICD-10-CM

## 2016-09-11 LAB — COMPLETE METABOLIC PANEL WITH GFR
ALT: 30 U/L — ABNORMAL HIGH (ref 6–29)
AST: 36 U/L — ABNORMAL HIGH (ref 10–30)
Albumin: 3.4 g/dL — ABNORMAL LOW (ref 3.6–5.1)
Alkaline Phosphatase: 82 U/L (ref 33–115)
BUN: 8 mg/dL (ref 7–25)
CALCIUM: 9 mg/dL (ref 8.6–10.2)
CHLORIDE: 97 mmol/L — AB (ref 98–110)
CO2: 28 mmol/L (ref 20–31)
CREATININE: 0.59 mg/dL (ref 0.50–1.10)
Glucose, Bld: 241 mg/dL — ABNORMAL HIGH (ref 65–99)
POTASSIUM: 4.1 mmol/L (ref 3.5–5.3)
Sodium: 136 mmol/L (ref 135–146)
Total Bilirubin: 0.4 mg/dL (ref 0.2–1.2)
Total Protein: 7.1 g/dL (ref 6.1–8.1)

## 2016-09-11 LAB — GLUCOSE, CAPILLARY: Glucose-Capillary: 278 mg/dL — ABNORMAL HIGH (ref 65–99)

## 2016-09-11 MED ORDER — LISINOPRIL 5 MG PO TABS: 5.0000 mg | ORAL_TABLET | Freq: Every day | ORAL | 5 refills | Status: DC

## 2016-09-11 MED ORDER — HYDROCHLOROTHIAZIDE 12.5 MG PO TABS
12.5000 mg | ORAL_TABLET | Freq: Every day | ORAL | 5 refills | Status: DC
Start: 2016-09-11 — End: 2017-09-15

## 2016-09-11 NOTE — Progress Notes (Signed)
Subjective:    Patient ID: [name], female    DOB: 09/01/1975, 41 y.o.   MRN: [id number]  HPI  [name], a 41 year old female with a history of obstructive sleep apnea and morbid obesity presents for a follow up of diabetes and hypertension.Patient was referred to pulmonology by the emergency department for shortness of breath. She recently had a sleep study and was diagnosed with OSA.  She has not been following a low fat, low sodium diet or exercising.   Patient denies chest pain, dyspnea, fatigue, irregular heart beat, orthopnea, palpitations and tachypnea.  Cardiovascular risk factors include: obesity (BMI >= 30 kg/m2) and sedentary lifestyle.  Ms. Shawnie Bradley also has a hemoglobin a1c of 7.7, which is consistent with type 2 diabetes mellitus. . Patient denies foot ulcerations, nausea, paresthesia of the feet, polydipsia, polyuria and visual disturbances.  Evaluation to date has been included: fasting blood sugar and fasting lipid panel.   Past Medical History:  Diagnosis Date  . Morbid obesity (HCC)   . No pertinent past medical history   . Obese   . Sleep apnea in adult    Immunization History  Administered Date(s) Administered  . Tdap 10/29/2011, 11/23/2012   Social History   Social History  . Marital status: Married    Spouse name: N/A  . Number of children: N/A  . Years of education: N/A   Occupational History  . Not on file.   Social History Main Topics  . Smoking status: Never Smoker  . Smokeless tobacco: Never Used  . Alcohol use No  . Drug use: No  . Sexual activity: Yes    Birth control/ protection: None   Other Topics Concern  . Not on file   Social History Narrative  . No narrative on file   Review of Systems  Constitutional: Positive for fatigue and unexpected weight change (weight gain).  HENT: Negative.   Eyes: Negative for visual disturbance.  Respiratory: Positive for shortness of breath and wheezing.   Gastrointestinal: Negative.    Endocrine: Positive for polydipsia.  Genitourinary: Negative.   Musculoskeletal: Negative.   Skin: Negative.   Allergic/Immunologic: Negative for immunocompromised state.  Neurological: Negative.   Hematological: Negative.   Psychiatric/Behavioral: Negative.        Objective:   Physical Exam  Constitutional: She is oriented to person, place, and time. She appears well-developed and well-nourished.  Morbid obesity  HENT:  Head: Normocephalic and atraumatic.  Right Ear: External ear normal.  Left Ear: External ear normal.  Nose: Nose normal.  Mouth/Throat: Oropharynx is clear and moist.  Eyes: Conjunctivae and EOM are normal. Pupils are equal, round, and reactive to light.  Neck: Normal range of motion. Neck supple.  Cardiovascular: Normal rate, regular rhythm, normal heart sounds and intact distal pulses.   Pulmonary/Chest: Effort normal and breath sounds normal.  Abdominal: Soft. Bowel sounds are normal.  Abdominal obesity  Musculoskeletal: Normal range of motion.  Neurological: She is alert and oriented to person, place, and time. She has normal reflexes.  Skin: Skin is warm and dry.  Psychiatric: She has a normal mood and affect. Her behavior is normal. Judgment and thought content normal.      BP (!) 150/89 (BP Location: Right Wrist, Patient Position: Sitting, Cuff Size: Normal)   Pulse (!) 114   Temp 98.5 F (36.9 C) (Oral)   Resp 20   Ht 5\' 2"  (1.575 m)   Wt (!) 468 lb (212.3 kg)  SpO2 97%   BMI 85.60 kg/m  Assessment & Plan:  1. Morbid (severe) obesity due to excess calories (HCC) Recommend a lowfat, low carbohydrate diet divided over 5-6 small meals, increase water intake to 6-8 glasses, and 150 minutes per week of cardiovascular exercise.    2. Type 2 diabetes mellitus without complication, without long-term current use of insulin (HCC) - Glucose, capillary - lisinopril (PRINIVIL,ZESTRIL) 5 MG tablet; Take 1 tablet (5 mg total) by mouth daily.  Dispense:  30 tablet; Refill: 5 - COMPLETE METABOLIC PANEL WITH GFR  3. Accelerated hypertension - lisinopril (PRINIVIL,ZESTRIL) 5 MG tablet; Take 1 tablet (5 mg total) by mouth daily.  Dispense: 30 tablet; Refill: 5 - hydrochlorothiazide (HYDRODIURIL) 12.5 MG tablet; Take 1 tablet (12.5 mg total) by mouth daily.  Dispense: 30 tablet; Refill: 5 - COMPLETE METABOLIC PANEL WITH GFR  4. Language barrier to communication Used video interpreter to assist with communication   RTC: 3 months for DMII and hypertensio   Courage Biglow M, FNP

## 2016-09-22 ENCOUNTER — Ambulatory Visit (INDEPENDENT_AMBULATORY_CARE_PROVIDER_SITE_OTHER): Payer: Self-pay | Admitting: Pulmonary Disease

## 2016-09-22 DIAGNOSIS — E662 Morbid (severe) obesity with alveolar hypoventilation: Secondary | ICD-10-CM

## 2016-09-22 DIAGNOSIS — G4733 Obstructive sleep apnea (adult) (pediatric): Secondary | ICD-10-CM

## 2016-09-22 NOTE — Progress Notes (Signed)
Subjective:    Patient ID: Shelley Bradley, female    DOB: 07/22/1976, 41 y.o.   MRN: [id number]  HPI Patient is here in the office as follow-up on her sleep apnea. She was seen by the practice last year while she was admitted at the hospital for acute on chronic hypoxemic hypercapnic respiratory failure in 03/2016.  CTA was suboptimal but there is no evidence of filling defects.  She had a sleep study done on 05/13/2016 which showed an AHI of 180. She was optimal on BiPAP 25/21 cm water. BiPAP was ordered but patient did not have insurance so she  didn't get it.  She only received her Medicaid last week. She remains very symptomatic. Has snoring, gasping, witnessed apneas, unrefreshed sleep. She ends up sleeping during the daytime and ends up being up at night. Per Shelley Bradley affects her functionality.     Review of Systems  Constitutional: Negative.  Negative for fever and unexpected weight change.  HENT: Negative.  Negative for congestion, dental problem, ear pain, nosebleeds, postnasal drip, rhinorrhea, sinus pressure, sneezing, sore throat and trouble swallowing.   Eyes: Negative.  Negative for redness and itching.  Respiratory: Positive for wheezing. Negative for cough, chest tightness and shortness of breath.   Cardiovascular: Positive for leg swelling. Negative for palpitations.  Gastrointestinal: Negative.  Negative for nausea and vomiting.  Endocrine: Negative.   Genitourinary: Negative.  Negative for dysuria.  Musculoskeletal: Negative.  Negative for joint swelling.  Skin: Negative.  Negative for rash.  Allergic/Immunologic: Negative.  Negative for environmental allergies, food allergies and immunocompromised state.  Neurological: Negative.  Negative for headaches.  Hematological: Does not bruise/bleed easily.  Psychiatric/Behavioral: Negative.  Negative for dysphoric mood. The patient is not nervous/anxious.        Objective:   Physical Exam  Vitals:  Vitals:   09/22/16 1344  BP: 122/80  Pulse: (!) 107  SpO2: 93%  Weight: (!) 463 lb (210 kg)  Height: 5\' 2"  (1.575 m)    Constitutional/General:  Morbidly obese, Pleasant, well-nourished, well-developed, not in any distress,  Comfortably seating.  Well kempt  Body mass index is 84.68 kg/m. Wt Readings from Last 3 Encounters:  09/22/16 (!) 463 lb (210 kg)  09/11/16 (!) 468 lb (212.3 kg)  08/10/16 (!) 473 lb (214.6 kg)      HEENT: Pupils equal and reactive to light and accommodation. Anicteric sclerae. Normal nasal mucosa.   No oral  lesions,  mouth clear,  oropharynx clear, no postnasal drip. (-) Oral thrush. No dental caries.  Airway - Mallampati class IV  Neck: No masses. Midline trachea. No JVD, (-) LAD. (-) bruits appreciated.  Respiratory/Chest: Grossly normal chest. (-) deformity. (-) Accessory muscle use.  Symmetric expansion. (-) Tenderness on palpation.  Resonant on percussion.  Diminished BS on both lower lung zones. (-) wheezing, crackles, rhonchi (-) egophony  Cardiovascular: Regular rate and  rhythm, heart sounds normal, no murmur or gallops, no peripheral edema  Gastrointestinal:  Normal bowel sounds. Soft, non-tender. No hepatosplenomegaly.  (-) masses.   Musculoskeletal:  Normal muscle tone. Normal gait.   Extremities: Grossly normal. (-) clubbing, cyanosis.  (-) edema  Skin: (-) rash,lesions seen.   Neurological/Psychiatric : alert, oriented to time, place, person. Normal mood and affect          Assessment & Plan:  OSA (obstructive sleep apnea) Morbidly obese lady with acute on chronic hypoxemic hypercapnic respiratory failure admitted 2017. Improved with diuresis.  She had a split night study 05/10/16  Trial of BiPAP therapy on 25/21 cm H2O with 2 liters supplemental oxygen. She was fitted with a Small size Resmed Full Face Mask AirFit F20 mask and heated humidification.05/10/16 > ordered 05/13/2016 but patient has not received it as she did NOT have  insurance until last week. Medicaid was approved last week.  Remains very symptomatic. Has snoring, witnessed apneas, gasping, choking.  Plan :  We extensively discussed the diagnosis, pathophysiology, and treatment options for Obstructive Sleep Apnea (OSA).  We discussed treatment options for OSA including CPAP, BiPaP, as well as surgical options and oral devices.   We will start patient on BiPaP 25/21 cm water. Patient tolerated BiPAP while she was admitted at [facility] in September 2017. She needs oxygen, 2 L with her BiPAP. BiPAP settings are based on sleep study. I'm not sure if she'll tolerate the high pressure. We'll figure out if she does. If not, will adjust settings based on her AHI.  Patient was instructed to call the office if he/she has not received the PAP device in 1-2 weeks.  Patient was instructed to have mask, tubings, filter, reservoir cleaned at least once a week with soapy water.  Patient was instructed to call the office if he/she is having issues with the PAP device.    I advised patient to obtain sufficient amount of sleep --  7 to 8 hours at least in a 24 hr period.  Patient was advised to follow good sleep hygiene.  Patient was advised NOT to engage in activities requiring concentration and/or vigilance if he/she is and  sleepy.  Patient is NOT to drive if he/she is sleepy.    I recommended bariatric surgery consult. She needs to get a primary care doctor and be referred to a bariatric surgeon.    Morbid (severe) obesity due to excess calories (HCC) Weight reduction  Obesity hypoventilation syndrome (HCC) Start Bipap treatment    Patient will follow up with me or NP in 4-6 weeks  J. Alexis Frock, MD 09/22/2016   3:14 PM Pulmonary and Critical Care Medicine [facility] Pager: [phone] Office: [phone], Fax: [phone]

## 2016-09-22 NOTE — Assessment & Plan Note (Signed)
Weight reduction 

## 2016-09-22 NOTE — Assessment & Plan Note (Addendum)
Morbidly obese lady with acute on chronic hypoxemic hypercapnic respiratory failure admitted 2017. Improved with diuresis.  She had a split night study 05/10/16 Trial of BiPAP therapy on 25/21 cm H2O with 2 liters supplemental oxygen. She was fitted with a Small size Resmed Full Face Mask AirFit F20 mask and heated humidification.05/10/16 > ordered 05/13/2016 but patient has not received it as she did NOT have insurance until last week. Medicaid was approved last week.  Remains very symptomatic. Has snoring, witnessed apneas, gasping, choking.  Plan :  We extensively discussed the diagnosis, pathophysiology, and treatment options for Obstructive Sleep Apnea (OSA).  We discussed treatment options for OSA including CPAP, BiPaP, as well as surgical options and oral devices.   We will start patient on BiPaP 25/21 cm water. Patient tolerated BiPAP while [facility]. She needs oxygen, 2 L with her BiPAP. BiPAP settings are based on sleep study. I'm not sure if she'll tolerate the high pressure. We'll figure out if she does. If not, will adjust settings based on her AHI.  Patient was instructed to call the office if he/she has not received the PAP device in 1-2 weeks.  Patient was instructed to have mask, tubings, filter, reservoir cleaned at least once a week with soapy water.  Patient was instructed to call the office if he/she is having issues with the PAP device.    I advised patient to obtain sufficient amount of sleep --  7 to 8 hours at least in a 24 hr period.  Patient was advised to follow good sleep hygiene.  Patient was advised NOT to engage in activities requiring concentration and/or vigilance if he/she is and  sleepy.  Patient is NOT to drive if he/she is sleepy.    I recommended bariatric surgery consult. She needs to get a primary care doctor and be referred to a bariatric surgeon.

## 2016-09-22 NOTE — Addendum Note (Signed)
Addended by: Garfield CorneaMABRY, JASMINE L on: 09/22/2016 03:21 PM   Modules accepted: Orders

## 2016-09-22 NOTE — Assessment & Plan Note (Signed)
Start Bipap treatment

## 2016-09-22 NOTE — Patient Instructions (Signed)
   It was a pleasure taking care of you today!  You are diagnosed with Obstructive Sleep Apnea or OSA.  You stop breathing  180  times an hour.   We will order you a Bipap 25/21 cm water with 2 L O2.   machine.  Please call the office if you do NOT receive your machine in the next 1-2 weeks.   Please make sure you use your CPAP device everytime you sleep.  We will monitor the usage of your machine per your insurance requirement.  Your insurance company may take the machine from you if you are not using it regularly.   Please clean the mask, tubings, filter, water reservoir with soapy water every week.  Please use distilled water for the water reservoir.   Please call the office or your machine provider (DME company) if you are having issues with the device.   Return to clinic in 6-8 weeks with Shelley Bradley or NP.

## 2016-11-03 ENCOUNTER — Ambulatory Visit: Payer: Self-pay | Admitting: Adult Health

## 2016-11-09 ENCOUNTER — Telehealth: Payer: Self-pay | Admitting: Pulmonary Disease

## 2016-11-09 NOTE — Telephone Encounter (Signed)
Patient had an appt with TP tomorrow 11/09/16 for a follow up on her CPAP machine. I asked the patient to bring in her machine or the SD card, she stated that she never received the machine. I looked at her chart and it appears the patient was trying to get a CPAP machine through the assistance program. Paperwork was signed and faxed last month. Patient wanted to cancel appt.   However, her daughter called back and stated that the pt was sick and still needed to be seen. Appt was remade for 4pm with TP.   Just a FYI

## 2016-11-10 ENCOUNTER — Ambulatory Visit: Payer: Self-pay | Admitting: Adult Health

## 2016-11-26 ENCOUNTER — Telehealth: Payer: Self-pay | Admitting: Pulmonary Disease

## 2016-11-26 NOTE — Telephone Encounter (Signed)
Attempted to call the pt but the VM has not been set up.  Will try back.

## 2016-11-27 ENCOUNTER — Ambulatory Visit: Payer: Self-pay | Admitting: Family Medicine

## 2016-11-27 NOTE — Telephone Encounter (Signed)
attempted to call the pt x 2 but the VM has not been set up.

## 2016-11-30 NOTE — Telephone Encounter (Signed)
Spoke with daughter who states mom has not received machine yet. Spoke with Cordelia PenSherry since pt went through pt assistant. Cordelia PenSherry states they can call the number on their form and also if they have no paid the $100 yet then she will not get the machine. ATC pt back had to leave a vm

## 2016-12-01 NOTE — Telephone Encounter (Signed)
Attempted to call the pt but the VM has not been set up x 3.

## 2016-12-02 NOTE — Telephone Encounter (Signed)
ATC pt's daughter, got pt on the phone she states to call back in an hour to reach her daughter who can help translate

## 2016-12-03 NOTE — Telephone Encounter (Signed)
Spoke with patient daughter about below, she will contact the assistance program and will let us know if unable to get anything accomplished at that time. Nothing further needed.

## 2017-01-14 ENCOUNTER — Emergency Department (HOSPITAL_COMMUNITY): Payer: Self-pay

## 2017-01-14 ENCOUNTER — Emergency Department (HOSPITAL_COMMUNITY)
Admission: EM | Admit: 2017-01-14 | Discharge: 2017-01-14 | Disposition: A | Discharge status: Home / Self Care | Payer: Self-pay | Attending: Emergency Medicine | Admitting: Emergency Medicine

## 2017-01-14 ENCOUNTER — Encounter (HOSPITAL_COMMUNITY): Payer: Self-pay | Admitting: *Deleted

## 2017-01-14 DIAGNOSIS — E119 Type 2 diabetes mellitus without complications: Secondary | ICD-10-CM | POA: Insufficient documentation

## 2017-01-14 DIAGNOSIS — R0789 Other chest pain: Secondary | ICD-10-CM | POA: Insufficient documentation

## 2017-01-14 DIAGNOSIS — Z7984 Long term (current) use of oral hypoglycemic drugs: Secondary | ICD-10-CM | POA: Insufficient documentation

## 2017-01-14 DIAGNOSIS — R0602 Shortness of breath: Secondary | ICD-10-CM | POA: Insufficient documentation

## 2017-01-14 DIAGNOSIS — M25512 Pain in left shoulder: Secondary | ICD-10-CM | POA: Insufficient documentation

## 2017-01-14 DIAGNOSIS — I10 Essential (primary) hypertension: Secondary | ICD-10-CM | POA: Insufficient documentation

## 2017-01-14 LAB — CBC
HCT: 37.5 % (ref 36.0–46.0)
HEMOGLOBIN: 10.9 g/dL — AB (ref 12.0–15.0)
MCH: 22.9 pg — AB (ref 26.0–34.0)
MCHC: 29.1 g/dL — ABNORMAL LOW (ref 30.0–36.0)
MCV: 78.9 fL (ref 78.0–100.0)
Platelets: 279 10*3/uL (ref 150–400)
RBC: 4.75 MIL/uL (ref 3.87–5.11)
RDW: 16.9 % — ABNORMAL HIGH (ref 11.5–15.5)
WBC: 7.2 10*3/uL (ref 4.0–10.5)

## 2017-01-14 LAB — BASIC METABOLIC PANEL
ANION GAP: 7 (ref 5–15)
BUN: 6 mg/dL (ref 6–20)
CALCIUM: 8.6 mg/dL — AB (ref 8.9–10.3)
CO2: 28 mmol/L (ref 22–32)
Chloride: 95 mmol/L — ABNORMAL LOW (ref 101–111)
Creatinine, Ser: 0.63 mg/dL (ref 0.44–1.00)
GFR calc non Af Amer: >60 mL/min (ref >60)
Glucose, Bld: 390 mg/dL — ABNORMAL HIGH (ref 65–99)
Potassium: 4.2 mmol/L (ref 3.5–5.1)
Sodium: 130 mmol/L — ABNORMAL LOW (ref 135–145)

## 2017-01-14 LAB — I-STAT TROPONIN, ED
TROPONIN I, POC: 0.01 ng/mL (ref 0.00–0.08)
TROPONIN I, POC: 0 ng/mL (ref 0.00–0.08)

## 2017-01-14 LAB — BRAIN NATRIURETIC PEPTIDE: B Natriuretic Peptide: 10.9 pg/mL (ref 0.0–100.0)

## 2017-01-14 MED ORDER — DIAZEPAM 5 MG PO TABS
5.0000 mg | ORAL_TABLET | Freq: Once | ORAL | Status: AC
  Administered 2017-01-14: 5 mg via ORAL
  Filled 2017-01-14: qty 1

## 2017-01-14 MED ORDER — ACETAMINOPHEN 325 MG PO TABS: 650.0000 mg | ORAL_TABLET | Freq: Four times a day (QID) | ORAL | 0 refills | Status: DC | PRN

## 2017-01-14 MED ORDER — ASPIRIN 81 MG PO CHEW
324.0000 mg | CHEWABLE_TABLET | Freq: Once | ORAL | Status: AC
  Administered 2017-01-14: 324 mg via ORAL
  Filled 2017-01-14: qty 4

## 2017-01-14 MED ORDER — IBUPROFEN 600 MG PO TABS: 600.0000 mg | ORAL_TABLET | Freq: Four times a day (QID) | ORAL | 0 refills | Status: DC | PRN

## 2017-01-14 MED ORDER — KETOROLAC TROMETHAMINE 15 MG/ML IJ SOLN
30.0000 mg | Freq: Once | INTRAMUSCULAR | Status: AC
  Administered 2017-01-14: 30 mg via INTRAMUSCULAR
  Filled 2017-01-14: qty 2

## 2017-01-14 NOTE — ED Notes (Signed)
Pt refusing blood draw.

## 2017-01-14 NOTE — Discharge Instructions (Signed)
Please see your doctor in 1 week.  Please return to the ER if you have worsening chest pain, shortness of breath, pain radiating to your jaw, shoulder, or back, sweats or fainting. Otherwise see the Cardiologist or your primary care doctor as requested.  ER results showed o heart attack and a normal ekg.

## 2017-01-14 NOTE — ED Notes (Signed)
Pt.refuse to get blood drawn in triage area,did let pt.know they would start an IV then blood would be drawn then,pt agree

## 2017-01-14 NOTE — ED Notes (Signed)
Daughter at bedside.  Patient is sleeping at present. Daughter states patient stated that pain medication  Had helped and she was not resting.

## 2017-01-14 NOTE — ED Notes (Signed)
Patient transported to X-ray 

## 2017-01-14 NOTE — ED Triage Notes (Signed)
Pt c/o chest pain radiating into L shoulder and L arm onset yesterday. Reports sob with difficulty catching her breath. Pt is guarding L arm and shoulder, denies injuries

## 2017-01-14 NOTE — ED Provider Notes (Addendum)
[facility]-EMERGENCY DEPT Provider Note   CSN: [id number] Arrival date & time: 01/14/17  0348     History   Chief Complaint Chief Complaint  Patient presents with  . Chest Pain  . Arm Pain    HPI Shelley Bradley is a 41 y.o. female with h/o morbid obesity, HTN, sleep apnea presenting today with chest and left arm pain that began at 07:00 yesterday and since onset her symptoms have become progressively worse.The patient describes the location of of chest pain as bilateral and radiates down her left arm. She also complains of SOB, which she describes as a chronic problem. Her pain is aggravated with deep inspiration - but pt reports that she always has some pain when she takes deep breath due to "lung blood stain". She denies any previous episode of pain similar to her current symptoms. She is also experiencing numbness and tingling in her left hand. She denies nausea, vomiting, diaphoresis. Pt has no hx of CAD or PE. Pt denies any trauma or strain to her upper body that she can recall.  HPI  Past Medical History:  Diagnosis Date  . Morbid obesity (HCC)   . No pertinent past medical history   . Obese   . Sleep apnea in adult     Patient Active Problem List   Diagnosis Date Noted  . Accelerated hypertension 08/12/2016  . Type 2 diabetes mellitus without complication, without long-term current use of insulin (HCC) 08/12/2016  . Language barrier to communication 08/12/2016  . Abdominal obesity and metabolic syndrome 08/12/2016  . Obesity hypoventilation syndrome (HCC) 05/13/2016  . Chronic respiratory failure (HCC) 05/13/2016  . OSA (obstructive sleep apnea) 05/06/2016  . Cephalalgia   . Acute respiratory failure with hypoxia (HCC) 04/01/2016  . Breast pain   . Dyspnea   . Ovarian cyst 05/27/2015  . Morbid (severe) obesity due to excess calories (HCC) 12/06/2014    Past Surgical History:  Procedure Laterality Date  . NO PAST SURGERIES      OB History    Gravida Para Term  Preterm AB Living   6 4 4  0 2 4   SAB TAB Ectopic Multiple Live Births   2 0 0 0 2       Home Medications    Prior to Admission medications   Medication Sig Start Date End Date Taking? Authorizing Provider  acetaminophen (TYLENOL) 325 MG tablet Take 2 tablets (650 mg total) by mouth every 6 (six) hours as needed. 01/14/17   Derwood Kaplan, MD  hydrochlorothiazide (HYDRODIURIL) 12.5 MG tablet Take 1 tablet (12.5 mg total) by mouth daily. 09/11/16   Massie Maroon, FNP  ibuprofen (ADVIL,MOTRIN) 600 MG tablet Take 1 tablet (600 mg total) by mouth every 6 (six) hours as needed. 01/14/17   Derwood Kaplan, MD  lisinopril (PRINIVIL,ZESTRIL) 5 MG tablet Take 1 tablet (5 mg total) by mouth daily. Patient not taking: Reported on 09/22/2016 09/11/16   Massie Maroon, FNP  metFORMIN (GLUCOPHAGE) 500 MG tablet Take 1 tablet (500 mg total) by mouth 2 (two) times daily with a meal. 08/10/16   Massie Maroon, FNP    Family History Family History  Problem Relation Age of Onset  . Heart disease Mother   . Diabetes Mother   . Diabetes Father   . Breast cancer Sister   . Breast cancer Paternal Grandmother   . Anesthesia problems Neg Hx   . Hypotension Neg Hx   . Malignant hyperthermia Neg Hx   .  Pseudochol deficiency Neg Hx     Social History Social History  Substance Use Topics  . Smoking status: Never Smoker  . Smokeless tobacco: Never Used  . Alcohol use No     Allergies   Patient has no known allergies.   Review of Systems Review of Systems  All other systems reviewed and are negative.     Physical Exam Updated Vital Signs BP 135/87   Pulse (!) 106   Temp 98.6 F (37 C)   Resp 13   LMP 12/13/2016   SpO2 96%   Physical Exam  Constitutional: She is oriented to person, place, and time. She appears well-developed.  Morbidly obese  HENT:  Head: Normocephalic and atraumatic.  Eyes: EOM are normal.  Neck: Normal range of motion.  Cardiovascular: Normal rate.     Pulmonary/Chest: Effort normal and breath sounds normal.  Abdominal: Bowel sounds are normal.  Musculoskeletal: She exhibits no edema or tenderness.  Neurological: She is alert and oriented to person, place, and time.  Skin: Skin is warm and dry.  Nursing note and vitals reviewed.    ED Treatments / Results  Labs (all labs ordered are listed, but only abnormal results are displayed) Labs Reviewed  BASIC METABOLIC PANEL - Abnormal; Notable for the following:       Result Value   Sodium 130 (*)    Chloride 95 (*)    Glucose, Bld 390 (*)    Calcium 8.6 (*)    All other components within normal limits  CBC - Abnormal; Notable for the following:    Hemoglobin 10.9 (*)    MCH 22.9 (*)    MCHC 29.1 (*)    RDW 16.9 (*)    All other components within normal limits  BRAIN NATRIURETIC PEPTIDE  I-STAT TROPOININ, ED  I-STAT TROPOININ, ED  I-STAT TROPOININ, ED    EKG  EKG Interpretation  Date/Time:  Thursday January 14 2017 03:55:53 EDT Ventricular Rate:  103 PR Interval:  138 QRS Duration: 82 QT Interval:  328 QTC Calculation: 429 R Axis:   92 Text Interpretation:  Sinus tachycardia Rightward axis Cannot rule out Inferior infarct , age undetermined [id number] - not new No significant change since last tracing Reconfirmed by Derwood Kaplan [phone]) on 01/14/2017 7:09:15 AM       Radiology No results found.  Procedures Procedures (including critical care time)  Medications Ordered in ED Medications  ketorolac (TORADOL) 15 MG/ML injection 30 mg (30 mg Intramuscular Given 01/14/17 0652)  diazepam (VALIUM) tablet 5 mg (5 mg Oral Given 01/14/17 0651)  aspirin chewable tablet 324 mg (324 mg Oral Given 01/14/17 6045)     Initial Impression / Assessment and Plan / ED Course  I have reviewed the triage vital signs and the nursing notes.  Pertinent labs & imaging results that were available during my care of the patient were reviewed by me and considered in my medical decision making  (see chart for details).  Clinical Course as of Jan 27 1920  Thu Jan 14, 2017  0826 Spoke about the case directly with Dr. Dione Housekeeper, Cardiology. We discussed the presentation, the atypical pain, the cardiac risk factors and the EKG and troponin results. Dr. Dione Housekeeper informed me that with patient weight > 200 kg, she would not be able to get a proper diagnostic study besides catheterization, and the catheterization would only be offered if there was objective evidence of heart damage. He reviewed the ekg with me, and the trops - and feels  that the patient needs to see PCP for optimal care. This has been discussed with the patient.  [AN]    Clinical Course User Index [AN] Derwood KaplanNanavati, Hektor Huston, MD    Pt comes in with cc of chest pain. Pt's chest pain is atypical - bilateral, radiating to the L side and there is pleuritic component to the pain. Pain is also worse with palpation of the chest, palpation of the anterior shoulder and abduction - both active and passive of the shoulder.  Differential diagnosis includes: ACS syndrome Aortic dissection Myocarditis Pericarditis Endocarditis Pneumonia Pleural effusion / Pulmonary edema PE Pneumothorax Musculoskeletal pain PUD / Gastritis / Esophagitis Esophageal spasm  Based on the hx and exam, we considered ACS, PE, Musculoskeletal chest pain and shoulder pain highest on our ddx. Pt is obese, has OSA and HTN as her cardiac risk factors. She appears to have DM - diet controlled.  EKG is showing no acute changes.  We will get CXR, shoulder Xrays, delta trops. Pain meds ordered.  CT PE scans were done 3 times last year - all neg. Her pain is very acute and there is L arm pain - so we doubt she has PE. We will not be working patient up for PE.  If delta trops is neg, with a HEART score of 3 - we will d/c with pcp f/u.  Final Clinical Impressions(s) / ED Diagnoses   Final diagnoses:  Chest wall pain  Acute pain of left shoulder    New  Prescriptions Discharge Medication List as of 01/14/2017  8:30 AM    START taking these medications   Details  acetaminophen (TYLENOL) 325 MG tablet Take 2 tablets (650 mg total) by mouth every 6 (six) hours as needed., Starting Thu 01/14/2017, Print         Derwood KaplanNanavati, Emira Eubanks, MD 01/14/17 [id number]    Derwood KaplanNanavati, Sofia Jaquith, MD 01/14/17 [id number]    Derwood KaplanNanavati, Ardis Fullwood, MD 01/26/17 Ernestina [id number]

## 2017-01-14 NOTE — ED Notes (Signed)
ED Provider at bedside. 

## 2017-03-15 ENCOUNTER — Encounter (HOSPITAL_COMMUNITY): Payer: Self-pay

## 2017-03-15 ENCOUNTER — Inpatient Hospital Stay (HOSPITAL_COMMUNITY)
Admission: AD | Admit: 2017-03-15 | Discharge: 2017-03-15 | Disposition: A | Discharge status: Home / Self Care | Payer: Self-pay | Source: Ambulatory Visit | Attending: Obstetrics and Gynecology | Admitting: Obstetrics and Gynecology

## 2017-03-15 DIAGNOSIS — B3731 Acute candidiasis of vulva and vagina: Secondary | ICD-10-CM

## 2017-03-15 DIAGNOSIS — N898 Other specified noninflammatory disorders of vagina: Secondary | ICD-10-CM

## 2017-03-15 DIAGNOSIS — N390 Urinary tract infection, site not specified: Secondary | ICD-10-CM | POA: Insufficient documentation

## 2017-03-15 DIAGNOSIS — B373 Candidiasis of vulva and vagina: Secondary | ICD-10-CM | POA: Insufficient documentation

## 2017-03-15 DIAGNOSIS — R81 Glycosuria: Secondary | ICD-10-CM | POA: Insufficient documentation

## 2017-03-15 DIAGNOSIS — Z3202 Encounter for pregnancy test, result negative: Secondary | ICD-10-CM | POA: Insufficient documentation

## 2017-03-15 LAB — URINALYSIS, ROUTINE W REFLEX MICROSCOPIC
BILIRUBIN URINE: NEGATIVE
Glucose, UA: >=500 mg/dL — AB
KETONES UR: 5 mg/dL — AB
NITRITE: POSITIVE — AB
PROTEIN: NEGATIVE mg/dL
Specific Gravity, Urine: 1.029 (ref 1.005–1.030)
pH: 8 (ref 5.0–8.0)

## 2017-03-15 LAB — WET PREP, GENITAL
CLUE CELLS WET PREP: NONE SEEN
Sperm: NONE SEEN
Trich, Wet Prep: NONE SEEN

## 2017-03-15 LAB — POCT PREGNANCY, URINE: PREG TEST UR: NEGATIVE

## 2017-03-15 MED ORDER — MICONAZOLE NITRATE 2 % EX CREA: 1.0000 "application " | TOPICAL_CREAM | Freq: Two times a day (BID) | CUTANEOUS | 0 refills | Status: DC

## 2017-03-15 MED ORDER — FLUCONAZOLE 150 MG PO TABS: 150.0000 mg | ORAL_TABLET | Freq: Every day | ORAL | 0 refills | Status: DC

## 2017-03-15 MED ORDER — SULFAMETHOXAZOLE-TRIMETHOPRIM 800-160 MG PO TABS: 1.0000 | ORAL_TABLET | Freq: Two times a day (BID) | ORAL | 0 refills | Status: AC

## 2017-03-15 NOTE — MAU Note (Addendum)
Reports stomach pain and vaginal rash/itching for 3 days. States she waxed for the first time 3 days ago and this made her itchy and noticed some "pimples that are really painful'. Also noticed an odor. No bleeding.

## 2017-03-15 NOTE — MAU Provider Note (Signed)
History  Chief Complaint:  Abdominal Pain and rash around vagina  Shelley Bradley is a 41 y.o. [phone] female presenting with a painful vulvar/vaginal rash and odor. She reports shaving 3 days prior for the first time, followed by some mild razor burn. She subsequently developed increasing erythema and pain, progressing to a painful rash and discharge. She states it is very itchy. She endorses a foul odor. She attempted itching the area with a plastic coat hanger. She also endorses painful urination. She denies increased urinary frequency or urgency.  Obstetrical History: OB History    Gravida Para Term Preterm AB Living   6 4 4  0 2 4   SAB TAB Ectopic Multiple Live Births   2 0 0 0 2      Past Medical History: Past Medical History:  Diagnosis Date  . Morbid obesity (HCC)   . No pertinent past medical history   . Obese     Past Surgical History: Past Surgical History:  Procedure Laterality Date  . NO PAST SURGERIES      Social History: Social History   Social History  . Marital status: Married    Spouse name: N/A  . Number of children: N/A  . Years of education: N/A   Social History Main Topics  . Smoking status: Never Smoker  . Smokeless tobacco: Never Used  . Alcohol use No  . Drug use: No  . Sexual activity: Yes    Birth control/ protection: None   Other Topics Concern  . None   Social History Narrative  . None    Allergies: No Known Allergies  No prescriptions prior to admission.    Review of Systems  Pertinent pos/neg as indicated in HPI  Physical Exam  Blood pressure (!) 146/82, pulse 94, temperature 98.1 F (36.7 C), temperature source Oral, resp. rate 20, height 5\' 2"  (1.575 m), last menstrual period 02/10/2017. General appearance: alert, cooperative and morbidly obese, exam limited due to body habitus Abdomen: soft, non-tender Extremities: no edema Pelvic exam: exam limited by body habitus. Cultures collected without speculum.   VULVA:  no masses, Tenderness to palpation in perineum, erythematous lesions noted in perineum, non herpetic, however tender. HSV culture collected.  VAGINA: vaginal tenderness upon swab, vaginal discharge - white, copious, curd-like and thick, WET MOUNT done, DNA probe for chlamydia and GC obtained, CERVIX: not visualized  Cultures/Specimens:  Wet Mount: Hyphae, WBCs  G/C and HSV Pending   MAU Course  History and physical exam Vaginal exam with cultures  Patient verbalized understanding and agreement with assessment and plan   Labs:  Results for orders placed or performed during the hospital encounter of 03/15/17 (from the past 24 hour(s))  Urinalysis, Routine w reflex microscopic     Status: Abnormal   Collection Time: 03/15/17  6:15 PM  Result Value Ref Range   Color, Urine YELLOW YELLOW   APPearance HAZY (A) CLEAR   Specific Gravity, Urine 1.029 1.005 - 1.030   pH 8.0 5.0 - 8.0   Glucose, UA >=500 (A) NEGATIVE mg/dL   Hgb urine dipstick SMALL (A) NEGATIVE   Bilirubin Urine NEGATIVE NEGATIVE   Ketones, ur 5 (A) NEGATIVE mg/dL   Protein, ur NEGATIVE NEGATIVE mg/dL   Nitrite POSITIVE (A) NEGATIVE   Leukocytes, UA LARGE (A) NEGATIVE   RBC / HPF 6-30 0 - 5 RBC/hpf   WBC, UA 6-30 0 - 5 WBC/hpf   Bacteria, UA FEW (A) NONE SEEN   Squamous Epithelial / LPF 0-5 (  A) NONE SEEN   Amorphous Crystal PRESENT   Pregnancy, urine POC     Status: None   Collection Time: 03/15/17  6:31 PM  Result Value Ref Range   Preg Test, Ur NEGATIVE NEGATIVE  Wet prep, genital     Status: Abnormal   Collection Time: 03/15/17  7:35 PM  Result Value Ref Range   Yeast Wet Prep HPF POC PRESENT (A) NONE SEEN   Trich, Wet Prep NONE SEEN NONE SEEN   Clue Cells Wet Prep HPF POC NONE SEEN NONE SEEN   WBC, Wet Prep HPF POC FEW (A) NONE SEEN   Sperm NONE SEEN     Imaging:  none Assessment and Plan  A: Patient is a [id number] who presents with painful vaginal rash and odor. Physical exam strongly suggestive  of candida vaginitis and wet prep positive for yeast. History of burning with urination and UA suggestive of UTI. Significant glycosuria present in UA, suggestive of poorly controlled DMII and consistent with previous A1C of 7.7. G/C and HSV still pending, will follow up with results.    P:  Candidal Vaginitis: Prescribed Diflucan 150mg  to be taken 3 days apart starting today. Prescribed topical miconazole. Pt advised to practice good hygiene and avoid abrasive washing or itching. Return precautions given for persistent irritation, pain or itching.   UTI: Prescribed Bactrim BID for treatment. Return precautions given for worsening of symptoms or new-onset symptoms.   Glycosuria: Patient advised to follow up with PCP for DMII management. Emphasized importance of long-term management and effects on current conditions.   Dannette Barbara, Medical Student 8/20/20188:24 PM  Due to language barrier, an interpreter was present during the history-taking and subsequent discussion and physical exam the physical exam with this patient   I confirm that I have verified the information documented in the Medical Students note and that I have also personally reperformed the physical exam and all medical decision making activities.   Venia Carbon I, NP 03/15/2017 .8:30 PM

## 2017-03-15 NOTE — Discharge Instructions (Signed)
Probiticos (Probiotics) QU SON LOS PROBITICOS? Los probiticos son bacterias y hongos beneficiosos que viven en el [place], y cuidan su salud y la salud del aparato digestivo. Adems, ayudan al sistema de defensa del organismo (inmunitario) y protegen al cuerpo contra la proliferacin de las bacterias nocivas. Ciertos alimentos, como el yogur, contienen probiticos. Los probiticos tambin pueden comprarse como un complemento. Al igual que sucede con cualquier complemento o frmaco, es importante hablar sobre su uso con el mdico. QU COSAS AFECTAN EL EQUILIBRIO DE LAS BACTERIAS EN MI CUERPO? El equilibrio de las bacterias en el cuerpo puede verse afectado por:  Antibiticos. A veces, los antibiticos son necesarios para tratar las infecciones. Lamentablemente, pueden matar, no solo a las bacterias que son nocivas para el organismo sino tambin a aquellas que son beneficiosas o tiles. Esto puede derivar en problemas gstricos, como diarrea, gases y clicos intestinales.  Enfermedades. Algunas enfermedades son el resultado de la proliferacin excesiva de las bacterias nocivas, los hongos o los parsitos. Estos problemas incluyen los siguientes: ? Diarrea infecciosa. ? Infecciones estomacales y respiratorias. ? Infecciones de la piel. ? Sndrome del intestino irritable (SII). ? Enfermedades inflamatorias del intestino. ? lcera debido a infeccin por Helicobacter pylori (H. pylori). ? Caries y enfermedad periodontal. ? Infecciones vaginales. El estrs y la alimentacin deficiente tambin pueden disminuir las bacterias beneficiosas del organismo. QU TIPO DE PROBITICO ES ADECUADO PARA M? Los probiticos estn disponibles en farmacias, tiendas de alimentos naturales o tiendas de comestibles de su localidad, y no se requiere Engineer, drilling. Vienen en muchas presentaciones, combinaciones de cepas y concentraciones de dosis diferentes. Es posible que algunos deban refrigerarse. Lea siempre la  etiqueta para conocer las instrucciones de uso y Production designer, theatre/television/film. Hay cepas especficas que han demostrado ser ms eficaces para determinadas enfermedades. Pregntele al mdico cul es la opcin ms adecuada para usted. POR QU NECESITARA LOS PROBITICOS? Hay muchas razones [employer] mdico puede recomendarle un complemento de probiticos, entre ellas:  Diarrea.  Estreimiento.  Sndrome del intestino irritable (SII).  Infecciones respiratorias.  Infecciones por hongos.  Acn, eczema y otras enfermedades de la piel.  Infecciones urinarias frecuentes. LOS PROBITICOS, CAUSAN EFECTOS SECUNDARIOS? Algunas personas tienen efectos secundarios leves cuando toman probiticos. Generalmente, estos efectos son temporales y pueden incluir:  Gases.  Meteorismo.  Clicos. En contadas ocasiones, pueden presentarse efectos secundarios graves, como infecciones o cambios a nivel del sistema inmunitario. QU MAS NECESITO SABER ACERCA DE LOS PROBITICOS?  Hay muchas cepas diferentes de probiticos. Algunas de ellas pueden ser ms eficaces dependiendo de la enfermedad. Los probiticos estn disponibles en diversas dosis. Consulte al mdico qu probitico debe consumir y con Engineer, structural.  Si toma probiticos junto con antibiticos, por lo general se recomienda esperar por lo menos 2horas entre la toma del antibitico y la ingesta del probitico.  Lawyer MS INFORMACIN: [employer] de Medicina Complementaria y Civil Service fast streamer [facility] for Complementary and Alternative Medicine), [email or web address].[email or web address]/ Esta informacin no tiene Theme park manager el consejo del mdico. Asegrese de hacerle al mdico cualquier pregunta que tenga. Document Released: 02/07/2014 Document Revised: 02/07/2014 Document Reviewed: 10/10/2013 Elsevier Interactive Patient Education  2017 Elsevier Inc.   Candidiasis vaginal en los adultos (Gastrointestinal Yeast Infection, Adult) La candidiasis  vaginal es una afeccin que causa dolor, hinchazn y enrojecimiento (inflamacin) de la vagina. Tambin causa secrecin vaginal. Esta es una enfermedad frecuente. Algunas mujeres contraen esta infeccin con frecuencia. CAUSAS La causa de la infeccin es un cambio en el equilibrio normal de los hongos (  cndida) y las bacterias que viven en la vagina. Esta alteracin deriva en el crecimiento excesivo de los hongos, lo que causa la inflamacin. FACTORES DE RIESGO Es ms probable que esta afeccin se manifieste en:  Las mujeres que toman antibiticos.  Las mujeres que tienen diabetes.  Las mujeres que toman anticonceptivos.  Las mujeres que estn embarazadas.  Las mujeres que se hacen duchas vaginales con frecuencia.  Las mujeres que tienen un sistema de defensa (inmunitario) dbil.  Las mujeres que han tomado corticoides durante mucho tiempo.  Las mujeres que usan ropa ajustada con frecuencia. SNTOMAS Los sntomas de esta afeccin incluyen lo siguiente:  Secrecin vaginal blanca y espesa.  Hinchazn, picazn, enrojecimiento e irritacin de la vagina. Los labios de la vagina (vulva) tambin se pueden infectar.  Dolor o ardor al Geographical information systems officer.  Dolor durante las [employer]. DIAGNSTICO Esta afeccin se diagnostica mediante la historia clnica y un examen fsico. Este incluye un examen plvico. El mdico examinar una muestra de la secrecin vaginal con un microscopio. Probablemente el mdico enve esta muestra al laboratorio para analizarla y confirmar el diagnstico. TRATAMIENTO Esta afeccin se trata con medicamentos. Los [employer] pueden ser recetados o de [address]. Podrn indicarle que use uno o ms de lo siguiente:  Medicamentos por va oral.  Medicamentos que se aplican como una crema.  Medicamentos que se colocan directamente en la vagina (vulos vaginales). INSTRUCCIONES PARA EL CUIDADO EN EL HOGAR  Tome o aplquese los medicamentos de venta libre y Scientist, product/process development como se lo haya indicado el mdico.  No tenga relaciones sexuales hasta que el mdico lo autorice. Comunique a su compaero sexual que tiene una infeccin por hongos. Esas personas deben consultar al mdico si tienen sntomas.  No use ropa ajustada, como pantis o pantalones ajustados.  Evite el uso de tampones hasta que el mdico lo autorice.  Consuma ms yogur. Esto puede ayudar a Artist de la candidiasis.  Intente darse un bao de asiento para [employer]. Se trata de un bao de agua tibia que se toma mientras se est sentado. El agua solo debe Adult nurse las caderas y cubrir las nalgas. Hgalo 3o 4veces al da o como se lo haya indicado el mdico.  No se haga duchas vaginales.  Use ropa interior transpirable de algodn.  Si tiene diabetes, mantenga bajo control los niveles de Banker. SOLICITE ATENCIN MDICA SI:  Lance Muss.  Los sntomas desaparecen y Stage manager.  Los sntomas no mejoran con Scientist, research (medical).  Los sntomas empeoran.  Aparecen nuevos sntomas.  Aparecen ampollas alrededor o adentro de la vagina.  Le sale sangre de la vagina y no est menstruando.  Siente dolor en el abdomen. Esta informacin no tiene Theme park manager el consejo del mdico. Asegrese de hacerle al mdico cualquier pregunta que tenga. Document Released: 04/22/2005 Document Revised: 11/04/2015 Document Reviewed: 01/14/2015 Elsevier Interactive Patient Education  [employer].

## 2017-03-16 LAB — GC/CHLAMYDIA PROBE AMP (~~LOC~~) NOT AT ARMC
CHLAMYDIA, DNA PROBE: NEGATIVE
NEISSERIA GONORRHEA: NEGATIVE

## 2017-03-17 LAB — HSV CULTURE AND TYPING

## 2017-05-04 ENCOUNTER — Inpatient Hospital Stay (HOSPITAL_COMMUNITY)
Admission: AD | Admit: 2017-05-04 | Discharge: 2017-05-04 | Disposition: A | Discharge status: Home / Self Care | Payer: Self-pay | Source: Ambulatory Visit | Attending: Family Medicine | Admitting: Family Medicine

## 2017-05-04 ENCOUNTER — Encounter (HOSPITAL_COMMUNITY): Payer: Self-pay | Admitting: Certified Nurse Midwife

## 2017-05-04 ENCOUNTER — Inpatient Hospital Stay (HOSPITAL_COMMUNITY): Payer: Self-pay

## 2017-05-04 DIAGNOSIS — Z3201 Encounter for pregnancy test, result positive: Secondary | ICD-10-CM | POA: Insufficient documentation

## 2017-05-04 DIAGNOSIS — E119 Type 2 diabetes mellitus without complications: Secondary | ICD-10-CM | POA: Insufficient documentation

## 2017-05-04 DIAGNOSIS — B3731 Acute candidiasis of vulva and vagina: Secondary | ICD-10-CM

## 2017-05-04 DIAGNOSIS — B373 Candidiasis of vulva and vagina: Secondary | ICD-10-CM | POA: Insufficient documentation

## 2017-05-04 LAB — URINALYSIS, ROUTINE W REFLEX MICROSCOPIC
Bacteria, UA: NONE SEEN
Bilirubin Urine: NEGATIVE
Ketones, ur: NEGATIVE mg/dL
Leukocytes, UA: NEGATIVE
Nitrite: NEGATIVE
Protein, ur: NEGATIVE mg/dL
SPECIFIC GRAVITY, URINE: 1.028 (ref 1.005–1.030)
pH: 6 (ref 5.0–8.0)

## 2017-05-04 LAB — WET PREP, GENITAL
Clue Cells Wet Prep HPF POC: NONE SEEN
SPERM: NONE SEEN
Trich, Wet Prep: NONE SEEN
YEAST WET PREP: NONE SEEN

## 2017-05-04 LAB — POCT PREGNANCY, URINE: Preg Test, Ur: POSITIVE — AB

## 2017-05-04 LAB — CBC
HEMATOCRIT: 38.1 % (ref 36.0–46.0)
HEMOGLOBIN: 12.2 g/dL (ref 12.0–15.0)
MCH: 25.4 pg — ABNORMAL LOW (ref 26.0–34.0)
MCHC: 32 g/dL (ref 30.0–36.0)
MCV: 79.4 fL (ref 78.0–100.0)
Platelets: 253 10*3/uL (ref 150–400)
RBC: 4.8 MIL/uL (ref 3.87–5.11)
RDW: 16 % — ABNORMAL HIGH (ref 11.5–15.5)
WBC: 6.7 10*3/uL (ref 4.0–10.5)

## 2017-05-04 LAB — HCG, QUANTITATIVE, PREGNANCY

## 2017-05-04 MED ORDER — TERCONAZOLE 0.8 % VA CREA: 1.0000 | TOPICAL_CREAM | Freq: Every day | VAGINAL | 0 refills | Status: DC

## 2017-05-04 NOTE — MAU Note (Signed)
Pt reports vaginal itching and burning that started 04/04/2017, was seen here in MAU. States she had pain at that time as well0-went away but has continued to have itching and burning. States she was given prescriptions, which she completed. Still having itching/burning. Pt denies vaginal discharge. States she has a little bit of vaginal bleeding-started period 04/30/2017.

## 2017-05-04 NOTE — Discharge Instructions (Signed)

## 2017-05-04 NOTE — MAU Provider Note (Signed)
Chief Complaint: Vaginal Itching   SUBJECTIVE HPI: Shelley Bradley is a 41 y.o. [phone] at Unknown who presents to MAU with complaints of vaginal itching and burning. She states she was in the MAU not to long ago and diagnosed with yeast infection. She took all the medications and symptoms improved for about 2 days but then came back. Believes she has an infection again. Denies vaginal discharge, dysuria, abdominal pain. Was recently diagnosed with DM but not on medication. Receives care at the HD. Having unprotected intercourse with husband. Unknown LMP. Started having dark red vaginal bleeding yesterday. Does not think she is pregnant.    Past Medical History:  Diagnosis Date  . Morbid obesity (HCC)   . No pertinent past medical history   . Obese    OB History  Gravida Para Term Preterm AB Living  0 2 4  SAB TAB Ectopic Multiple Live Births  2 0 0 0 2    # Outcome Date GA Lbr Len/2nd Weight Sex Delivery Anes PTL Lv  6 Term 11/23/12 [redacted]w[redacted]d 17:45 / 00:05 2.948 kg (6 lb 8 oz) F Vag-Spont EPI  LIV  5 Term 10/28/11 [redacted]w[redacted]d 15:10 / 00:09 2.781 kg (6 lb 2.1 oz) F Vag-Spont EPI  LIV     Birth Comments: moulding, caput  4 Term           3 SAB           2 SAB           1 Term              Past Surgical History:  Procedure Laterality Date  . NO PAST SURGERIES     Social History   Social History  . Marital status: Married    Spouse name: N/A  . Number of children: N/A  . Years of education: N/A   Occupational History  . Not on file.   Social History Main Topics  . Smoking status: Never Smoker  . Smokeless tobacco: Never Used  . Alcohol use No  . Drug use: No  . Sexual activity: Yes    Birth control/ protection: None   Other Topics Concern  . Not on file   Social History Narrative  . No narrative on file   No current facility-administered medications on file prior to encounter.    Current Outpatient Prescriptions on File Prior to Encounter  Medication Sig  Dispense Refill  . acetaminophen (TYLENOL) 325 MG tablet Take 2 tablets (650 mg total) by mouth every 6 (six) hours as needed. 30 tablet 0  . fluconazole (DIFLUCAN) 150 MG tablet Take 1 tablet (150 mg total) by mouth daily. Take first tablet today and repeat on 03/18/17 2 tablet 0  . hydrochlorothiazide (HYDRODIURIL) 12.5 MG tablet Take 1 tablet (12.5 mg total) by mouth daily. 30 tablet 5  . ibuprofen (ADVIL,MOTRIN) 600 MG tablet Take 1 tablet (600 mg total) by mouth every 6 (six) hours as needed. 30 tablet 0  . lisinopril (PRINIVIL,ZESTRIL) 5 MG tablet Take 1 tablet (5 mg total) by mouth daily. (Patient not taking: Reported on 09/22/2016) 30 tablet 5  . metFORMIN (GLUCOPHAGE) 500 MG tablet Take 1 tablet (500 mg total) by mouth 2 (two) times daily with a meal. 180 tablet 3  . miconazole (MICOTIN) 2 % cream Apply 1 application topically 2 (two) times daily. 28.35 g 0   No Known Allergies  I have reviewed the past Medical Hx, Surgical Hx, Social Hx,  Allergies and Medications.   REVIEW OF SYSTEMS All systems reviewed and are negative for acute change except as noted in the HPI.   OBJECTIVE BP (!) 166/86 (BP Location: Right Arm)   Pulse 98   Temp 98.1 F (36.7 C) (Oral)   Resp 20   Ht  (1.549 m)   Wt (!) 185.5 kg (409 lb)   LMP 04/30/2017   SpO2 97%   BMI 77.28 kg/m    PHYSICAL EXAM Constitutional: Well-developed, obese female in no acute distress.  Cardiovascular: normal rate and rhythm Respiratory: normal rate and effort.  GI: Abd soft, non-tender, non-distended. Protuberant. MS: Extremities nontender, edema, normal ROM Neurologic: Alert and oriented x 4. No focal deficits SPECULUM EXAM: vulvar candidiasis with acute inflammation, physiologic discharge, dark blood noted, cervix dilated BIMANUAL: cervix 1.5cm dialted; uterus normal size, no adnexal tenderness or masses. No CMT. Psych: normal mood and affect  LAB RESULTS Results for orders placed or performed during the  hospital encounter of 05/04/17 (from the past 24 hour(s))  Urinalysis, Routine w reflex microscopic     Status: Abnormal   Collection Time: 05/04/17  7:56 PM  Result Value Ref Range   Color, Urine STRAW (A) YELLOW   APPearance CLEAR CLEAR   Specific Gravity, Urine 1.028 1.005 - 1.030   pH 6.0 5.0 - 8.0   Glucose, UA >=500 (A) NEGATIVE mg/dL   Hgb urine dipstick LARGE (A) NEGATIVE   Bilirubin Urine NEGATIVE NEGATIVE   Ketones, ur NEGATIVE NEGATIVE mg/dL   Protein, ur NEGATIVE NEGATIVE mg/dL   Nitrite NEGATIVE NEGATIVE   Leukocytes, UA NEGATIVE NEGATIVE   RBC / HPF TOO NUMEROUS TO COUNT 0 - 5 RBC/hpf   WBC, UA 6-30 0 - 5 WBC/hpf   Bacteria, UA NONE SEEN NONE SEEN   Squamous Epithelial / LPF 0-5 (A) NONE SEEN  Pregnancy, urine POC     Status: Abnormal   Collection Time: 05/04/17  8:42 PM  Result Value Ref Range   Preg Test, Ur POSITIVE (A) NEGATIVE  Wet prep, genital     Status: Abnormal   Collection Time: 05/04/17  8:59 PM  Result Value Ref Range   Yeast Wet Prep HPF POC NONE SEEN NONE SEEN   Trich, Wet Prep NONE SEEN NONE SEEN   Clue Cells Wet Prep HPF POC NONE SEEN NONE SEEN   WBC, Wet Prep HPF POC FEW (A) NONE SEEN   Sperm NONE SEEN   hCG, quantitative, pregnancy     Status: None   Collection Time: 05/04/17  9:09 PM  Result Value Ref Range   hCG, Beta Chain, Quant, S <1 <5 mIU/mL  CBC     Status: Abnormal   Collection Time: 05/04/17  9:09 PM  Result Value Ref Range   WBC 6.7 4.0 - 10.5 K/uL   RBC 4.80 3.87 - 5.11 MIL/uL   Hemoglobin 12.2 12.0 - 15.0 g/dL   HCT 16.1 09.6 - 04.5 %   MCV 79.4 78.0 - 100.0 fL   MCH 25.4 (L) 26.0 - 34.0 pg   MCHC 32.0 30.0 - 36.0 g/dL   RDW 40.9 (H) 81.1 - 91.4 %   Platelets 253 150 - 400 K/uL    MAU COURSE  MDM Plan of care reviewed with patient, including labs and tests ordered and medical treatment Vitals and nursing notes reviewed I have ordered labs/imaging and reviewed them Upreg - POS hcg - NEG  Care turned over to Deandra Gadson,  MD at 10:15p due to end  of shift change.  Caryl Ada, DO OB Fellow Faculty Practice, [facility] [place] 05/04/2017, 8:37 PM   U/S did not visualize an IUP, "extremily limited ultrasound due to patient body habitus" per radiology and pt did not tolerate TC scan.  Discussed with Dr. Adrian Blackwater, given positive UPT and bHCQ quant <1; UPT false positive and no further follow up recommended.   ASSESSMENT 1. Vulvar candidiasis   2. Positive urine pregnancy test     PLAN --Rx for vulvar candidiasis per Dr. Doroteo Glassman --Discussed false positive pregnancy test with patient. However, given limited U/S, discussed ectopic pregnancy precautions and other return precautions at length. She voiced understanding of all precautions.  --Discharge home in stable condition.  Raynelle Fanning P. Garvin Ellena, MD OB Fellow

## 2017-05-05 LAB — GC/CHLAMYDIA PROBE AMP (~~LOC~~) NOT AT ARMC
Chlamydia: NEGATIVE
NEISSERIA GONORRHEA: NEGATIVE

## 2017-05-18 ENCOUNTER — Emergency Department (HOSPITAL_COMMUNITY)
Admission: EM | Admit: 2017-05-18 | Discharge: 2017-05-18 | Disposition: A | Discharge status: Home / Self Care | Payer: Self-pay | Attending: Emergency Medicine | Admitting: Emergency Medicine

## 2017-05-18 ENCOUNTER — Encounter (HOSPITAL_COMMUNITY): Payer: Self-pay | Admitting: Emergency Medicine

## 2017-05-18 DIAGNOSIS — R109 Unspecified abdominal pain: Secondary | ICD-10-CM | POA: Insufficient documentation

## 2017-05-18 DIAGNOSIS — N939 Abnormal uterine and vaginal bleeding, unspecified: Secondary | ICD-10-CM | POA: Insufficient documentation

## 2017-05-18 DIAGNOSIS — Z5321 Procedure and treatment not carried out due to patient leaving prior to being seen by health care provider: Secondary | ICD-10-CM | POA: Insufficient documentation

## 2017-05-18 LAB — CBC
HCT: 38.8 % (ref 36.0–46.0)
Hemoglobin: 12.4 g/dL (ref 12.0–15.0)
MCH: 25.7 pg — AB (ref 26.0–34.0)
MCHC: 32 g/dL (ref 30.0–36.0)
MCV: 80.5 fL (ref 78.0–100.0)
PLATELETS: 283 10*3/uL (ref 150–400)
RBC: 4.82 MIL/uL (ref 3.87–5.11)
RDW: 15.8 % — ABNORMAL HIGH (ref 11.5–15.5)
WBC: 7.6 10*3/uL (ref 4.0–10.5)

## 2017-05-18 LAB — URINALYSIS, ROUTINE W REFLEX MICROSCOPIC
Bacteria, UA: NONE SEEN
Bilirubin Urine: NEGATIVE
KETONES UR: NEGATIVE mg/dL
Nitrite: NEGATIVE
PROTEIN: 30 mg/dL — AB
SPECIFIC GRAVITY, URINE: 1.028 (ref 1.005–1.030)
pH: 6 (ref 5.0–8.0)

## 2017-05-18 LAB — COMPREHENSIVE METABOLIC PANEL
ALBUMIN: 3.4 g/dL — AB (ref 3.5–5.0)
ALK PHOS: 113 U/L (ref 38–126)
ALT: 51 U/L (ref 14–54)
AST: 56 U/L — AB (ref 15–41)
Anion gap: 8 (ref 5–15)
BUN: 6 mg/dL (ref 6–20)
CALCIUM: 8.9 mg/dL (ref 8.9–10.3)
CHLORIDE: 93 mmol/L — AB (ref 101–111)
CO2: 30 mmol/L (ref 22–32)
CREATININE: 0.57 mg/dL (ref 0.44–1.00)
GFR calc non Af Amer: >60 mL/min (ref >60)
GLUCOSE: 390 mg/dL — AB (ref 65–99)
Potassium: 4.2 mmol/L (ref 3.5–5.1)
SODIUM: 131 mmol/L — AB (ref 135–145)
Total Bilirubin: 0.3 mg/dL (ref 0.3–1.2)
Total Protein: 7.7 g/dL (ref 6.5–8.1)

## 2017-05-18 LAB — HCG, QUANTITATIVE, PREGNANCY: hCG, Beta Chain, Quant, S: <1 m[IU]/mL (ref <5)

## 2017-05-18 LAB — LIPASE, BLOOD: LIPASE: 25 U/L (ref 11–51)

## 2017-05-18 MED ORDER — ONDANSETRON 4 MG PO TBDP
ORAL_TABLET | ORAL | Status: AC
  Filled 2017-05-18: qty 1

## 2017-05-18 MED ORDER — ONDANSETRON 4 MG PO TBDP
4.0000 mg | ORAL_TABLET | Freq: Once | ORAL | Status: AC | PRN
  Administered 2017-05-18: 4 mg via ORAL

## 2017-05-18 NOTE — ED Triage Notes (Signed)
Patient reports vaginal spotting with right abdominal pain and emesis onset yesterday , LMP June 2018 unsure of AOG , G5P4 , no diarrhea or fever .

## 2017-05-18 NOTE — ED Notes (Signed)
Pt tired of waiting upset over the wait

## 2017-07-30 ENCOUNTER — Inpatient Hospital Stay (HOSPITAL_COMMUNITY)
Admission: AD | Admit: 2017-07-30 | Discharge: 2017-07-30 | Disposition: A | Discharge status: Home / Self Care | Payer: Self-pay | Source: Ambulatory Visit | Attending: Obstetrics & Gynecology | Admitting: Obstetrics & Gynecology

## 2017-07-30 ENCOUNTER — Encounter (HOSPITAL_COMMUNITY): Payer: Self-pay | Admitting: *Deleted

## 2017-07-30 DIAGNOSIS — Z7984 Long term (current) use of oral hypoglycemic drugs: Secondary | ICD-10-CM | POA: Insufficient documentation

## 2017-07-30 DIAGNOSIS — R1030 Lower abdominal pain, unspecified: Secondary | ICD-10-CM

## 2017-07-30 DIAGNOSIS — N898 Other specified noninflammatory disorders of vagina: Secondary | ICD-10-CM | POA: Insufficient documentation

## 2017-07-30 DIAGNOSIS — R102 Pelvic and perineal pain: Secondary | ICD-10-CM | POA: Insufficient documentation

## 2017-07-30 DIAGNOSIS — Z79899 Other long term (current) drug therapy: Secondary | ICD-10-CM | POA: Insufficient documentation

## 2017-07-30 DIAGNOSIS — N938 Other specified abnormal uterine and vaginal bleeding: Secondary | ICD-10-CM | POA: Insufficient documentation

## 2017-07-30 DIAGNOSIS — Z791 Long term (current) use of non-steroidal anti-inflammatories (NSAID): Secondary | ICD-10-CM | POA: Insufficient documentation

## 2017-07-30 LAB — WET PREP, GENITAL
Clue Cells Wet Prep HPF POC: NONE SEEN
Sperm: NONE SEEN
Trich, Wet Prep: NONE SEEN
Yeast Wet Prep HPF POC: NONE SEEN

## 2017-07-30 LAB — CBC
HCT: 36.2 % (ref 36.0–46.0)
Hemoglobin: 11.7 g/dL — ABNORMAL LOW (ref 12.0–15.0)
MCH: 27.1 pg (ref 26.0–34.0)
MCHC: 32.3 g/dL (ref 30.0–36.0)
MCV: 84 fL (ref 78.0–100.0)
Platelets: 266 10*3/uL (ref 150–400)
RBC: 4.31 MIL/uL (ref 3.87–5.11)
RDW: 14.7 % (ref 11.5–15.5)
WBC: 11.5 10*3/uL — ABNORMAL HIGH (ref 4.0–10.5)

## 2017-07-30 LAB — URINALYSIS, ROUTINE W REFLEX MICROSCOPIC
BACTERIA UA: NONE SEEN
BILIRUBIN URINE: NEGATIVE
Glucose, UA: 50 mg/dL — AB
KETONES UR: NEGATIVE mg/dL
NITRITE: NEGATIVE
Protein, ur: 30 mg/dL — AB
SPECIFIC GRAVITY, URINE: 1.025 (ref 1.005–1.030)
pH: 6 (ref 5.0–8.0)

## 2017-07-30 LAB — POCT PREGNANCY, URINE: Preg Test, Ur: NEGATIVE

## 2017-07-30 LAB — GC/CHLAMYDIA PROBE AMP (~~LOC~~) NOT AT ARMC
Chlamydia: NEGATIVE
Neisseria Gonorrhea: NEGATIVE

## 2017-07-30 MED ORDER — KETOROLAC TROMETHAMINE 60 MG/2ML IM SOLN
60.0000 mg | Freq: Once | INTRAMUSCULAR | Status: AC
  Administered 2017-07-30: 60 mg via INTRAMUSCULAR
  Filled 2017-07-30: qty 2

## 2017-07-30 MED ORDER — MEGESTROL ACETATE 40 MG PO TABS: 40.0000 mg | ORAL_TABLET | Freq: Every day | ORAL | 0 refills | Status: DC

## 2017-07-30 NOTE — MAU Note (Signed)
PT SAYS SHE STARTED BLEEDING ON 11-15 AND HAS CONTINUED EVERYDAY  AND CRAMPS-  PAIN IS A 10- BUT HAS NOT TAKEN ANY MED..  NO BIRTHCONTROL. LAST SEX- OCT.

## 2017-07-30 NOTE — MAU Note (Signed)
USED PACIFIC INTERPRETER- RANDALL

## 2017-07-30 NOTE — Discharge Instructions (Signed)
Sangrado uterino anormal °(Abnormal Uterine Bleeding) °Sangrado uterino anormal significa que hay un sangrado por la vagina que no es su período menstrual normal. Puede ser: °· Pérdidas de sangre o hemorragias entre los períodos. °· Hemorragias luego de tener sexo (relaciones sexuales). °· Sangrado abundante o más que lo habitual. °· Períodos que duran más que lo normal. °· Sangrado luego de la menopausia. °Hay muchos problemas que pueden ser la causa. El tratamiento dependerá de la causa del sangrado. Cualquier tipo de sangrado que no sea normal debe consultarse con el médico. °CUIDADOS EN EL HOGAR °Controle su afección para ver si hay cambios. Estas indicaciones podrán disminuir cualquier molestia que tenga: °· No use tampones ni duchas vaginales o como le haya indicado el médico. °· Cambie los apósitos con frecuencia. °Deberá hacerse exámenes pélvicos regulares y pruebas de Papanicolaou. Realice los estudios indicados según le indique su médico. °SOLICITE AYUDA SI: °· El sangrado dura más de 1 semana. °· Se siente mareada por momentos. ° °SOLICITE AYUDA DE INMEDIATO SI: °· Se desmaya. °· Tiene que cambiarse los apósitos cada 15 a 30 minutos. °· Siente dolor en el abdomen. °· Tiene fiebre. °· Se siente débil o presenta sudoración. °· Elimina coágulos grandes por la vagina. °· Siente malestar estomacal (náuseas) y devuelve (vomita). ° °ASEGÚRESE DE QUE: °· Comprende estas instrucciones. °· Controlará su afección. °· Recibirá ayuda de inmediato si no mejora o si empeora. ° °Esta información no tiene como fin reemplazar el consejo del médico. Asegúrese de hacerle al médico cualquier pregunta que tenga. °Document Released: 08/15/2010 Document Revised: 07/18/2013 Document Reviewed: 02/09/2013 °Elsevier Interactive Patient Education © 2017 [employer] ° °

## 2017-07-30 NOTE — MAU Provider Note (Signed)
Chief Complaint: Abdominal Pain and Vaginal bleeding    First Provider Initiated Contact with Patient 07/30/17 0524     SUBJECTIVE HPI: Shelley Bradley is a 42 y.o. [id number] not pregnant female who presents to maternity admissions reporting vaginal bleeding and abdominal pain for two months since 06/10/2017.  Vaginal Bleeding  The patient's primary symptoms include pelvic pain and vaginal bleeding. This is a chronic problem. The current episode started more than 1 month ago. The problem occurs constantly. The problem has been unchanged. The pain is moderate. The problem affects both sides. She is not pregnant. Associated symptoms include abdominal pain. Pertinent negatives include no chills, constipation, diarrhea, dysuria, fever, frequency, headaches, nausea, urgency or vomiting. The vaginal discharge was bloody. The vaginal bleeding is typical of menses. She has not been passing clots. She has not been passing tissue. Nothing aggravates the symptoms. She has tried nothing for the symptoms. She is not sexually active. She uses nothing for contraception. Her menstrual history has been irregular. Her past medical history is significant for miscarriage. There is no history of a gynecological surgery.  Abdominal Pain  This is a chronic problem. The current episode started more than 1 month ago. The onset quality is gradual. The problem occurs intermittently. The most recent episode lasted 2 months. The problem has been unchanged. The pain is located in the suprapubic region. The pain is at a severity of 9/10. The quality of the pain is cramping. The abdominal pain does not radiate. Pertinent negatives include no constipation, diarrhea, dysuria, fever, frequency, headaches, nausea or vomiting. She has tried nothing for the symptoms.  Patient drove herself to MAU but states daughter will pick her up.   Past Medical History:  Diagnosis Date  . Morbid obesity (HCC)   . No pertinent past medical history    . Obese    Past Surgical History:  Procedure Laterality Date  . NO PAST SURGERIES     Social History   Socioeconomic History  . Marital status: Married    Spouse name: Not on file  . Number of children: Not on file  . Years of education: Not on file  . Highest education level: Not on file  Social Needs  . Financial resource strain: Not on file  . Food insecurity - worry: Not on file  . Food insecurity - inability: Not on file  . Transportation needs - medical: Not on file  . Transportation needs - non-medical: Not on file  Occupational History  . Not on file  Tobacco Use  . Smoking status: Never Smoker  . Smokeless tobacco: Never Used  Substance and Sexual Activity  . Alcohol use: No  . Drug use: No  . Sexual activity: Yes    Birth control/protection: None  Other Topics Concern  . Not on file  Social History Narrative  . Not on file   No current facility-administered medications on file prior to encounter.    Current Outpatient Medications on File Prior to Encounter  Medication Sig Dispense Refill  . hydrochlorothiazide (HYDRODIURIL) 12.5 MG tablet Take 1 tablet (12.5 mg total) by mouth daily. 30 tablet 5  . metFORMIN (GLUCOPHAGE) 500 MG tablet Take 1 tablet (500 mg total) by mouth 2 (two) times daily with a meal. 180 tablet 3  . acetaminophen (TYLENOL) 325 MG tablet Take 2 tablets (650 mg total) by mouth every 6 (six) hours as needed. 30 tablet 0  . fluconazole (DIFLUCAN) 150 MG tablet Take 1 tablet (150 mg total) by  mouth daily. Take first tablet today and repeat on 03/18/17 2 tablet 0  . ibuprofen (ADVIL,MOTRIN) 600 MG tablet Take 1 tablet (600 mg total) by mouth every 6 (six) hours as needed. 30 tablet 0  . lisinopril (PRINIVIL,ZESTRIL) 5 MG tablet Take 1 tablet (5 mg total) by mouth daily. (Patient not taking: Reported on 09/22/2016) 30 tablet 5  . miconazole (MICOTIN) 2 % cream Apply 1 application topically 2 (two) times daily. 28.35 g 0  . terconazole (TERAZOL 3)  0.8 % vaginal cream Place 1 applicator vaginally at bedtime. 20 g 0   No Known Allergies  ROS:  Review of Systems  Constitutional: Negative for chills and fever.  Respiratory: Negative.   Cardiovascular: Negative.   Gastrointestinal: Positive for abdominal pain. Negative for constipation, diarrhea, nausea and vomiting.  Genitourinary: Positive for pelvic pain and vaginal bleeding. Negative for difficulty urinating, dysuria, frequency, urgency and vaginal pain.  Musculoskeletal: Negative.   Neurological: Negative for headaches.  Psychiatric/Behavioral: Negative.    I have reviewed patient's Past Medical Hx, Surgical Hx, Family Hx, Social Hx, medications and allergies.   Physical Exam   Patient Vitals for the past 24 hrs:  BP Temp Temp src Pulse Resp SpO2 Height Weight  07/30/17 0639 - - - 98 20 95 % - -  07/30/17 0505 131/89 99.5 F (37.5 C) Oral (!) 102 20 - 5\' 2"  (1.575 m) (!) 396 lb 12 oz (180 kg)   Constitutional: Well-developed, morbid obese female in mild acute distress.  Cardiovascular: normal rate Respiratory: normal effort GI: Abd soft, tender with palpation of lower abdominal region. Pos BS x 4 MS: Extremities nontender, no edema, normal ROM Neurologic: Alert and oriented x 4.  GU: Neg CVAT.  PELVIC EXAM: Cervix pink, visually closed, without lesion, moderate dark red bleeding present with no clots, vaginal walls and external genitalia normal Bimanual exam: Cervix 0/long/high, firm, anterior, neg CMT, uterus tender, nonenlarged, adnexa without tenderness, enlargement, or mass  LAB RESULTS Results for orders placed or performed during the hospital encounter of 07/30/17 (from the past 24 hour(s))  Urinalysis, Routine w reflex microscopic     Status: Abnormal   Collection Time: 07/30/17  5:10 AM  Result Value Ref Range   Color, Urine YELLOW YELLOW   APPearance HAZY (A) CLEAR   Specific Gravity, Urine 1.025 1.005 - 1.030   pH 6.0 5.0 - 8.0   Glucose, UA 50 (A)  NEGATIVE mg/dL   Hgb urine dipstick LARGE (A) NEGATIVE   Bilirubin Urine NEGATIVE NEGATIVE   Ketones, ur NEGATIVE NEGATIVE mg/dL   Protein, ur 30 (A) NEGATIVE mg/dL   Nitrite NEGATIVE NEGATIVE   Leukocytes, UA TRACE (A) NEGATIVE   RBC / HPF TOO NUMEROUS TO COUNT 0 - 5 RBC/hpf   WBC, UA 6-30 0 - 5 WBC/hpf   Bacteria, UA NONE SEEN NONE SEEN   Squamous Epithelial / LPF 6-30 (A) NONE SEEN   Mucus PRESENT   Pregnancy, urine POC     Status: None   Collection Time: 07/30/17  5:19 AM  Result Value Ref Range   Preg Test, Ur NEGATIVE NEGATIVE  Wet prep, genital     Status: Abnormal   Collection Time: 07/30/17  5:40 AM  Result Value Ref Range   Yeast Wet Prep HPF POC NONE SEEN NONE SEEN   Trich, Wet Prep NONE SEEN NONE SEEN   Clue Cells Wet Prep HPF POC NONE SEEN NONE SEEN   WBC, Wet Prep HPF POC FEW (A) NONE SEEN  Sperm NONE SEEN   CBC     Status: Abnormal   Collection Time: 07/30/17  6:13 AM  Result Value Ref Range   WBC 11.5 (H) 4.0 - 10.5 K/uL   RBC 4.31 3.87 - 5.11 MIL/uL   Hemoglobin 11.7 (L) 12.0 - 15.0 g/dL   HCT 16.136.2 09.636.0 - 04.546.0 %   MCV 84.0 78.0 - 100.0 fL   MCH 27.1 26.0 - 34.0 pg   MCHC 32.3 30.0 - 36.0 g/dL   RDW 40.914.7 81.111.5 - 91.415.5 %   Platelets 266 150 - 400 K/uL    MAU Management/MDM: Orders Placed This Encounter  Procedures  . Wet prep, genital  . Urinalysis, Routine w reflex microscopic  . CBC  . Pregnancy, urine POC  CBC- hemoglobin normal, patient not symptomatic for loss of excess blood   Meds ordered this encounter  Medications  . ketorolac (TORADOL) injection 60 mg  . megestrol (MEGACE) 40 MG tablet    Sig: Take 1 tablet (40 mg total) by mouth daily. Can increase to two tablets twice a day in the event of heavy bleeding    Dispense:  14 tablet    Refill:  0    Order Specific Question:   Supervising Provider    Answer:   Willodean RosenthalHARRAWAY-SMITH, CAROLYN [[id number]]    Consult Dr Erin FullingHarraway Smith with assessment and management of DUB.  Treatments in MAU included  Toradol 60mg  IM- pain relieved with medication. Pt discharged with instructions on Megace use for 2 weeks with OP Pelvic US and follow up appointment in the office after US.  ASSESSMENT 1. DUB (dysfunctional uterine bleeding)   2. Lower abdominal pain     PLAN Discharge home Rx Megace for DUB Outpatient pelvic US and appointment in office following- scheduling will call with appointment information  Follow up with PCP or OBGYN in office if symptoms worsen   Allergies as of 07/30/2017   No Known Allergies     Medication List    TAKE these medications   acetaminophen 325 MG tablet Commonly known as:  TYLENOL Take 2 tablets (650 mg total) by mouth every 6 (six) hours as needed.   fluconazole 150 MG tablet Commonly known as:  DIFLUCAN Take 1 tablet (150 mg total) by mouth daily. Take first tablet today and repeat on 03/18/17   hydrochlorothiazide 12.5 MG tablet Commonly known as:  HYDRODIURIL Take 1 tablet (12.5 mg total) by mouth daily.   ibuprofen 600 MG tablet Commonly known as:  ADVIL,MOTRIN Take 1 tablet (600 mg total) by mouth every 6 (six) hours as needed.   lisinopril 5 MG tablet Commonly known as:  PRINIVIL,ZESTRIL Take 1 tablet (5 mg total) by mouth daily.   megestrol 40 MG tablet Commonly known as:  MEGACE Take 1 tablet (40 mg total) by mouth daily. Can increase to two tablets twice a day in the event of heavy bleeding   metFORMIN 500 MG tablet Commonly known as:  GLUCOPHAGE Take 1 tablet (500 mg total) by mouth 2 (two) times daily with a meal.   miconazole 2 % cream Commonly known as:  MICOTIN Apply 1 application topically 2 (two) times daily.   terconazole 0.8 % vaginal cream Commonly known as:  TERAZOL 3 Place 1 applicator vaginally at bedtime.       Shelley Bradley  Certified Nurse-Midwife 07/30/2017  5:50 AM

## 2017-08-13 ENCOUNTER — Ambulatory Visit (HOSPITAL_COMMUNITY)
Admission: RE | Admit: 2017-08-13 | Discharge: 2017-08-13 | Disposition: A | Discharge status: Home / Self Care | Payer: Self-pay | Source: Ambulatory Visit | Attending: Certified Nurse Midwife | Admitting: Certified Nurse Midwife

## 2017-08-13 DIAGNOSIS — R1030 Lower abdominal pain, unspecified: Secondary | ICD-10-CM

## 2017-08-13 DIAGNOSIS — N83201 Unspecified ovarian cyst, right side: Secondary | ICD-10-CM | POA: Insufficient documentation

## 2017-08-13 DIAGNOSIS — N83202 Unspecified ovarian cyst, left side: Secondary | ICD-10-CM | POA: Insufficient documentation

## 2017-08-13 DIAGNOSIS — N938 Other specified abnormal uterine and vaginal bleeding: Secondary | ICD-10-CM

## 2017-08-16 ENCOUNTER — Encounter: Payer: Self-pay | Admitting: Obstetrics and Gynecology

## 2017-08-19 ENCOUNTER — Telehealth: Payer: Self-pay | Admitting: General Practice

## 2017-08-19 ENCOUNTER — Inpatient Hospital Stay (HOSPITAL_COMMUNITY)
Admission: AD | Admit: 2017-08-19 | Discharge: 2017-08-19 | Discharge status: Absent without leave | Payer: Self-pay | Source: Ambulatory Visit | Attending: Obstetrics and Gynecology | Admitting: Obstetrics and Gynecology

## 2017-08-19 NOTE — Telephone Encounter (Signed)
Patient called into front office requesting ultrasound test results. Spoke with patient through [employer] the interpreter. Informed patient of ultrasound results and that the cyst could be causing some of her pain but they usually go away on their own. Patient asked why her uterine lining might be thickened. Told patient there are many reasons, the biggest being her period is about to start but most of the time it isn't anything concerning. Patient asked if she should do anything for the cyst between now and her appt. Told patient no but she can take ibuprofen as needed for pain. Patient verbalized understanding and states sometimes intercourse is painful and she bleeds after. Told patient the doctor can discuss that with her at her appt on 2/13. Patient verbalized understanding to all & had no questions

## 2017-09-02 ENCOUNTER — Other Ambulatory Visit: Payer: Self-pay | Admitting: Family Medicine

## 2017-09-02 DIAGNOSIS — E119 Type 2 diabetes mellitus without complications: Secondary | ICD-10-CM

## 2017-09-04 ENCOUNTER — Inpatient Hospital Stay (HOSPITAL_COMMUNITY)
Admission: AD | Admit: 2017-09-04 | Discharge: 2017-09-04 | Disposition: A | Discharge status: Home / Self Care | Payer: Self-pay | Source: Ambulatory Visit | Attending: Obstetrics & Gynecology | Admitting: Obstetrics & Gynecology

## 2017-09-04 ENCOUNTER — Encounter (HOSPITAL_COMMUNITY): Payer: Self-pay

## 2017-09-04 DIAGNOSIS — Z6841 Body Mass Index (BMI) 40.0 and over, adult: Secondary | ICD-10-CM | POA: Insufficient documentation

## 2017-09-04 DIAGNOSIS — Z79899 Other long term (current) drug therapy: Secondary | ICD-10-CM | POA: Insufficient documentation

## 2017-09-04 DIAGNOSIS — Z7984 Long term (current) use of oral hypoglycemic drugs: Secondary | ICD-10-CM | POA: Insufficient documentation

## 2017-09-04 DIAGNOSIS — E119 Type 2 diabetes mellitus without complications: Secondary | ICD-10-CM | POA: Insufficient documentation

## 2017-09-04 DIAGNOSIS — R109 Unspecified abdominal pain: Secondary | ICD-10-CM | POA: Insufficient documentation

## 2017-09-04 DIAGNOSIS — N939 Abnormal uterine and vaginal bleeding, unspecified: Secondary | ICD-10-CM | POA: Insufficient documentation

## 2017-09-04 HISTORY — DX: Type 2 diabetes mellitus without complications: E11.9

## 2017-09-04 LAB — CBC
HCT: 38.2 % (ref 36.0–46.0)
HEMOGLOBIN: 12.9 g/dL (ref 12.0–15.0)
MCH: 27 pg (ref 26.0–34.0)
MCHC: 33.8 g/dL (ref 30.0–36.0)
MCV: 80.1 fL (ref 78.0–100.0)
Platelets: 267 10*3/uL (ref 150–400)
RBC: 4.77 MIL/uL (ref 3.87–5.11)
RDW: 14.4 % (ref 11.5–15.5)
WBC: 6.1 10*3/uL (ref 4.0–10.5)

## 2017-09-04 MED ORDER — MEGESTROL ACETATE 40 MG PO TABS: 40.0000 mg | ORAL_TABLET | Freq: Every day | ORAL | 0 refills | Status: DC

## 2017-09-04 MED ORDER — IBUPROFEN 600 MG PO TABS: 600.0000 mg | ORAL_TABLET | Freq: Four times a day (QID) | ORAL | 2 refills | Status: DC | PRN

## 2017-09-04 MED ORDER — KETOROLAC TROMETHAMINE 60 MG/2ML IM SOLN
60.0000 mg | Freq: Once | INTRAMUSCULAR | Status: AC
  Administered 2017-09-04: 60 mg via INTRAMUSCULAR
  Filled 2017-09-04: qty 2

## 2017-09-04 NOTE — MAU Note (Signed)
Pt reports vaginal bleeding that started yesterday and passing large clots. States since 5pm, she has passed 15 large clots. States the last blood clot looks like tissue. Pt           reports a lot of vaginal pain. States she took Advil-last took at 8pm. Rates 10/10. States she feels worn out and tired. States the medicine she received the last time she was      seen, she was given medication that helped her bleeding. After receiving the medication, did not have any episodes of bleeding up until yesterday.

## 2017-09-04 NOTE — MAU Provider Note (Signed)
History     CSN: [id number]  Arrival date and time: 09/04/17 0205   First Provider Initiated Contact with Patient 09/04/17 0249      Chief Complaint  Patient presents with  . Vaginal Bleeding   HPI Shelley Bradley is a 42 y.o. [phone] non pregnant female who presents with vaginal bleeding. She states she has been bleeding since November. This is an ongoing problem and has been seen in MAU multiple times for the same complaint. She states the bleeding is always heavy and is passing clots. She also has lower abdominal cramping that she rates a 6/10 and has not tried anything for the pain. She has an appointment in the clinic on 2/13.   OB History    Gravida Para Term Preterm AB Living   6 4 4  0 2 4   SAB TAB Ectopic Multiple Live Births   2 0 0 0 4      Past Medical History:  Diagnosis Date  . Diabetes mellitus without complication (HCC)   . Morbid obesity (HCC)   . No pertinent past medical history   . Obese     Past Surgical History:  Procedure Laterality Date  . NO PAST SURGERIES      Family History  Problem Relation Age of Onset  . Heart disease Mother   . Diabetes Mother   . Diabetes Father   . Breast cancer Sister   . Breast cancer Paternal Grandmother   . Anesthesia problems Neg Hx   . Hypotension Neg Hx   . Malignant hyperthermia Neg Hx   . Pseudochol deficiency Neg Hx     Social History   Tobacco Use  . Smoking status: Never Smoker  . Smokeless tobacco: Never Used  Substance Use Topics  . Alcohol use: No  . Drug use: No    Allergies: No Known Allergies  Facility-Administered Medications Prior to Admission  Medication Dose Route Frequency Provider Last Rate Last Dose  . cloNIDine (CATAPRES) tablet 0.2 mg  0.2 mg Oral Once Massie MaroonHollis, Lachina M, FNP       Medications Prior to Admission  Medication Sig Dispense Refill Last Dose  . ibuprofen (ADVIL,MOTRIN) 600 MG tablet Take 1 tablet (600 mg total) by mouth every 6 (six) hours as needed. 30  tablet 0 09/03/2017 at 2000  . megestrol (MEGACE) 40 MG tablet Take 1 tablet (40 mg total) by mouth daily. Can increase to two tablets twice a day in the event of heavy bleeding 14 tablet 0 Past Month at Unknown time  . metFORMIN (GLUCOPHAGE) 500 MG tablet Take 1 tablet (500 mg total) by mouth 2 (two) times daily with a meal. 180 tablet 3 09/03/2017 at Unknown time  . acetaminophen (TYLENOL) 325 MG tablet Take 2 tablets (650 mg total) by mouth every 6 (six) hours as needed. 30 tablet 0 More than a month at Unknown time  . fluconazole (DIFLUCAN) 150 MG tablet Take 1 tablet (150 mg total) by mouth daily. Take first tablet today and repeat on 03/18/17 2 tablet 0 More than a month at Unknown time  . hydrochlorothiazide (HYDRODIURIL) 12.5 MG tablet Take 1 tablet (12.5 mg total) by mouth daily. 30 tablet 5 Unknown at Unknown time  . lisinopril (PRINIVIL,ZESTRIL) 5 MG tablet Take 1 tablet (5 mg total) by mouth daily. (Patient not taking: Reported on 09/22/2016) 30 tablet 5 Not Taking  . miconazole (MICOTIN) 2 % cream Apply 1 application topically 2 (two) times daily. 28.35 g 0 More than  a month at Unknown time  . terconazole (TERAZOL 3) 0.8 % vaginal cream Place 1 applicator vaginally at bedtime. 20 g 0 More than a month at Unknown time    Review of Systems  Constitutional: Negative.  Negative for fatigue and fever.  HENT: Negative.   Respiratory: Negative.  Negative for shortness of breath.   Cardiovascular: Negative.  Negative for chest pain.  Gastrointestinal: Positive for abdominal pain. Negative for constipation, diarrhea, nausea and vomiting.  Genitourinary: Positive for vaginal bleeding. Negative for dysuria.  Neurological: Negative.  Negative for dizziness and headaches.   Physical Exam   Height 5\' 1"  (1.549 m), weight (!) 380 lb (172.4 kg), last menstrual period 09/03/2017.  Physical Exam  Nursing note and vitals reviewed. Constitutional: She is oriented to person, place, and time. She appears  well-developed and well-nourished. No distress.  HENT:  Head: Normocephalic.  Eyes: Pupils are equal, round, and reactive to light.  Cardiovascular: Normal rate, regular rhythm and normal heart sounds.  Respiratory: Effort normal and breath sounds normal. No respiratory distress.  GI: Soft. Bowel sounds are normal. She exhibits no distension. There is no tenderness.  Genitourinary:  Genitourinary Comments: Small amount of dark red blood. No clots noted  Neurological: She is alert and oriented to person, place, and time.  Skin: Skin is warm and dry.  Psychiatric: She has a normal mood and affect. Her behavior is normal. Judgment and thought content normal.    MAU Course  Procedures Results for orders placed or performed during the hospital encounter of 09/04/17 (from the past 24 hour(s))  CBC     Status: None   Collection Time: 09/04/17  2:37 AM  Result Value Ref Range   WBC 6.1 4.0 - 10.5 K/uL   RBC 4.77 3.87 - 5.11 MIL/uL   Hemoglobin 12.9 12.0 - 15.0 g/dL   HCT 16.1 09.6 - 04.5 %   MCV 80.1 78.0 - 100.0 fL   MCH 27.0 26.0 - 34.0 pg   MCHC 33.8 30.0 - 36.0 g/dL   RDW 40.9 81.1 - 91.4 %   Platelets 267 150 - 400 K/uL   MDM UA, UPT CBC Toradol 60mg  IM- patient reports relief from pain.  Assessment and Plan   1. Abnormal uterine bleeding   2. Abdominal cramping    -Discharge home in stable condition -Rx for ibuprofen and megace given to patient's pharmacy -Vaginal bleeding precautions discussed -Patient advised to follow-up with [facility] on 2/13 as scheduled  -Patient may return to MAU as needed or if her condition were to change or worsen  Rolm Bookbinder CNM 09/04/2017, 2:49 AM

## 2017-09-04 NOTE — Discharge Instructions (Signed)
Sangrado uterino anormal °(Abnormal Uterine Bleeding) °Sangrado uterino anormal significa que hay un sangrado por la vagina que no es su período menstrual normal. Puede ser: °· Pérdidas de sangre o hemorragias entre los períodos. °· Hemorragias luego de tener sexo (relaciones sexuales). °· Sangrado abundante o más que lo habitual. °· Períodos que duran más que lo normal. °· Sangrado luego de la menopausia. °Hay muchos problemas que pueden ser la causa. El tratamiento dependerá de la causa del sangrado. Cualquier tipo de sangrado que no sea normal debe consultarse con el médico. °CUIDADOS EN EL HOGAR °Controle su afección para ver si hay cambios. Estas indicaciones podrán disminuir cualquier molestia que tenga: °· No use tampones ni duchas vaginales o como le haya indicado el médico. °· Cambie los apósitos con frecuencia. °Deberá hacerse exámenes pélvicos regulares y pruebas de Papanicolaou. Realice los estudios indicados según le indique su médico. °SOLICITE AYUDA SI: °· El sangrado dura más de 1 semana. °· Se siente mareada por momentos. ° °SOLICITE AYUDA DE INMEDIATO SI: °· Se desmaya. °· Tiene que cambiarse los apósitos cada 15 a 30 minutos. °· Siente dolor en el abdomen. °· Tiene fiebre. °· Se siente débil o presenta sudoración. °· Elimina coágulos grandes por la vagina. °· Siente malestar estomacal (náuseas) y devuelve (vomita). ° °ASEGÚRESE DE QUE: °· Comprende estas instrucciones. °· Controlará su afección. °· Recibirá ayuda de inmediato si no mejora o si empeora. ° °Esta información no tiene como fin reemplazar el consejo del médico. Asegúrese de hacerle al médico cualquier pregunta que tenga. °Document Released: 08/15/2010 Document Revised: 07/18/2013 Document Reviewed: 02/09/2013 °Elsevier Interactive Patient Education © 2017 [employer] ° °

## 2017-09-08 ENCOUNTER — Encounter: Payer: Self-pay | Admitting: Obstetrics and Gynecology

## 2017-09-15 ENCOUNTER — Ambulatory Visit (INDEPENDENT_AMBULATORY_CARE_PROVIDER_SITE_OTHER): Payer: Self-pay | Admitting: Family Medicine

## 2017-09-15 ENCOUNTER — Encounter: Payer: Self-pay | Admitting: Family Medicine

## 2017-09-15 VITALS — BP 131/85 | HR 110 | Temp 98.6°F | Resp 16 | Ht 62.0 in | Wt 375.0 lb

## 2017-09-15 DIAGNOSIS — I10 Essential (primary) hypertension: Secondary | ICD-10-CM

## 2017-09-15 DIAGNOSIS — E119 Type 2 diabetes mellitus without complications: Secondary | ICD-10-CM

## 2017-09-15 DIAGNOSIS — Z6841 Body Mass Index (BMI) 40.0 and over, adult: Secondary | ICD-10-CM

## 2017-09-15 DIAGNOSIS — Z789 Other specified health status: Secondary | ICD-10-CM

## 2017-09-15 LAB — POCT URINALYSIS DIP (DEVICE)
GLUCOSE, UA: 500 mg/dL — AB
Ketones, ur: 80 mg/dL — AB
LEUKOCYTES UA: NEGATIVE
NITRITE: NEGATIVE
Protein, ur: 100 mg/dL — AB
Specific Gravity, Urine: 1.025 (ref 1.005–1.030)
UROBILINOGEN UA: 0.2 mg/dL (ref 0.0–1.0)
pH: 5.5 (ref 5.0–8.0)

## 2017-09-15 LAB — POCT GLYCOSYLATED HEMOGLOBIN (HGB A1C): HEMOGLOBIN A1C: 11.9

## 2017-09-15 MED ORDER — SITAGLIPTIN PHOSPHATE 50 MG PO TABS: 50.0000 mg | ORAL_TABLET | Freq: Every day | ORAL | 5 refills | Status: DC

## 2017-09-15 MED ORDER — LANCET DEVICE MISC: 1.0000 | Freq: Three times a day (TID) | 5 refills | Status: DC

## 2017-09-15 MED ORDER — GLUCOSE BLOOD VI STRP: ORAL_STRIP | 12 refills | Status: DC

## 2017-09-15 MED ORDER — METFORMIN HCL 1000 MG PO TABS: 1000.0000 mg | ORAL_TABLET | Freq: Two times a day (BID) | ORAL | 5 refills | Status: DC

## 2017-09-15 MED ORDER — LANCETS MISC: 1.0000 | Freq: Three times a day (TID) | 5 refills | Status: DC

## 2017-09-15 MED ORDER — TRUE METRIX AIR GLUCOSE METER DEVI: 1.0000 | Freq: Three times a day (TID) | 0 refills | Status: DC

## 2017-09-15 MED ORDER — LISINOPRIL 5 MG PO TABS: 5.0000 mg | ORAL_TABLET | Freq: Every day | ORAL | 5 refills | Status: DC

## 2017-09-15 NOTE — Patient Instructions (Addendum)
Your hemoglobin a1C is 11.9, which is above goal. You and I discussed starting insulin at length. Will start Metformin 1000 mg twice daily with meals and Januvia once daily with breakfast.  For hypertension, will start a low dose of Lisinopril 5 mg daily.  previously   Your A1C goal is less than 7. Your fasting blood sugar  Upon awakening goal is between 110-140.  Blood pressure goal is <140/90.  Recommend a lowfat, low carbohydrate diet divided over 5-6 small meals, increase water intake to 6-8 glasses, and 150 minutes per week of cardiovascular exercise.   Take your medications as prescribed Make sure that you are familiar with each one of your medications and what they are used to treat.  If you are unsure of medications, please bring to follow up Will send referral for eye exam  Please keep your scheduled follow up appointment.  Su hemoglobina a1C es 11,9, que est por encima de la meta. T y yo discutimos comenzar la insulina al fin. Comenzar metformina 1000 mg dos veces al da con las comidas y [employer] vez al da con el desayuno.  Para la hipertensin, comenzar una dosis baja de lisinopril 5 mg al da.  Previamente Tu objetivo de A1C es menos de 7. Su azcar en sangre en ayunas al despertar el objetivo es entre 110-140.  La meta de la presin sangunea es < 140/90.  Recomiende una dieta baja en grasas, bajo en carbohidratos dividida en 5-6 comidas pequeas, aumente la ingesta de agua a 6-8 vasos y 150 minutos por semana de ejercicio cardiovascular.   Tome sus medicamentos segn lo prescrito Asegrese de que est familiarizado con cada uno de sus medicamentos y lo que se utilizan para tratar.  Si no est seguro de los [employer], por favor traiga a Systems analyst remisin para el examen de la vista por favor mantenga su cita programada de seguimiento.    Hacer ejercicio para Psychiatric nurse (Exercising to [employer]) Hacer ejercicio puede ayudarlo a bajar de peso.  Para bajar de [employer] ejercicio, este debe ser de intensidad vigorosa. Puede saber que est haciendo ejercicio de intensidad vigorosa si respira con mucha dificultad y rapidez, y no puede mantener una conversacin. El ejercicio de intensidad moderada ayuda a Radio producer peso actual. Puede saber que est haciendo ejercicio de intensidad moderada si tiene una frecuencia cardaca ms elevada y [place] respiracin ms rpida, pero an puede Diplomatic Services operational officer. CON QU FRECUENCIA DEBO HACER EJERCICIO? Elija una actividad que disfrute y establezca objetivos realistas. El mdico puede ayudarlo a Event organiser un plan de actividades que funcione para usted. Haga ejercicio regularmente como se lo haya indicado el mdico. Esta puede incluir:  Programme researcher, broadcasting/film/video de resistencia dos veces por semana, como: ? Flexiones de [employer]. ? Abdominales. ? Levantamiento de pesas. ? Ejercicios con bandas elsticas.  Realizar una intensidad determinada de ejercicio durante una cantidad determinada de [place]. Elija entre estas opciones: ? de ejercicio de intensidad moderada cada semana. ? de ejercicio de intensidad vigorosa cada semana. ? Burlene Arnt de ejercicio de intensidad moderada y vigorosa cada semana. Los nios, las mujeres [place], las personas que no estn en forma, las personas con sobrepeso y los adultos mayores tal vez tengan que consultar a un mdico para que les d Medical laboratory scientific officer. Si tiene [employer], asegrese de Science writer al mdico antes de comenzar un programa de ejercicios nuevo. CULES SON ALGUNAS ACTIVIDADES QUE PUEDEN AYUDARME A BAJAR DE PESO?  Caminar  a un ritmo de al menos 4,73millas (7kilmetros) por hora.  Trotar o correr a un ritmo de 5 millas (8 kilmetros) por hora.  Andar en bicicleta a un ritmo de al menos 10 millas (16 kilmetros) por hora.  Practicar natacin.  Practicar patinaje sobre ruedas normales o en lnea.  Hacer  esqu de fondo.  Hacer deportes competitivos vigorosos, como ftbol americano, bsquet y ftbol.  Saltar la soga.  Tomar clases de baile aerbico. CMO PUEDO SER MS ACTIVO EN MIS ACTIVIDADES DIARIAS?  Utilice las Microbiologist del ascensor.  D una caminata durante su hora de almuerzo.  Si conduce, estacione el automvil ms lejos del trabajo o de la escuela.  Si [place] transporte pblico, bjese una parada antes y camine el resto del camino.  Pngase de pie y camine cada vez que haga llamadas telefnicas.  Levntese, estrese y camine cada a lo largo del Futures trader. QU PAUTAS DEBO SEGUIR MIENTRAS HAGO EJERCICIO?  No haga ejercicio en exceso que pudiera hacer que se lastime, se sienta mareado o tenga dificultad para respirar.  Consulte al mdico antes de comenzar un programa de ejercicios nuevo.  Use ropa cmoda y calzado con buen soporte.  Beba gran cantidad de agua mientras hace ejercicios para evitar la deshidratacin o los golpes de Airline pilot. Durante la actividad fsica se pierde agua corporal que se debe reponer.  Haga ejercicio hasta que se acelere su respiracin y sus latidos cardacos. Esta informacin no tiene Theme park manager el consejo del mdico. Asegrese de hacerle al mdico cualquier pregunta que tenga. Document Released: 10/17/2010 Document Revised: 08/03/2014 Document Reviewed: 12/14/2013 Elsevier Interactive Patient Education  2018 [employer]. Obesidad en los adultos (Obesity, Adult) La obesidad es un exceso de grasa corporal. Si usted tiene un ndice de masa corporal (IMC) de 30o ms, esto significa que es obeso. [facility] es un nmero que indica la cantidad de grasa corporal que tiene una persona. A menudo, la causa de la obesidad es el consumo de ms caloras que las que el cuerpo [place]. La obesidad puede causar problemas de salud graves. Cambiar el estilo de vida puede ayudar a tratar la obesidad. CUIDADOS EN EL HOGAR Comida y bebida  Siga las  indicaciones del mdico respecto de las comidas y las bebidas. Su mdico puede recomendarle lo siguiente: ? Reducir Geneticist, molecular) las comidas rpidas, los dulces y las colaciones procesadas. ? Elegir opciones con bajo contenido de [place]. Por ejemplo, Counsellor de [employer]ak. ? Consumir 5o ms porciones de frutas o verduras por da. ? Comer en casa con ms frecuencia. Esto le da ms control sobre lo que come. ? Cuando coma afuera, elija alimentos saludables. ? Aprenda cul es el tamao de una porcin saludable. El tamao de una porcin es la cantidad de un alimento determinado que se considera saludable consumir de Micro. Esto es diferente para Advertising account planner. ? Tenga a mano colaciones con bajo contenido de grasas. ? Evite las bebidas azucaradas. Estas incluyen refrescos, jugo de frutas, t helado endulzado con azcar y [place] saborizada. ? Desayune de forma saludable.  Beba suficiente agua para mantener la orina clara o de color amarillo plido.  No est sin comer durante perodos prolongados (no ayune).  No haga dietas de moda (dietas pasajeras). Actividad fsica  Haga ejercicios con frecuencia, como se lo haya indicado el mdico. Pregntele al mdico lo siguiente: ? Los tipos de ejercicios que son seguros para usted. ? La frecuencia con la que debe hacer  los ejercicios.  Haga un precalentamiento y elongacin adecuados antes de la Archieactividad.  Haga un estiramiento lento despus de la actividad (relajacin).  Descanse entre los perodos de Pegramactividad. Estilo de vida  Limite el tiempo que pasa frente a la televisin, la computadora o los videojuegos (sea menos sedentario).  Busque formas de recompensarse que no incluyan alimentos.  Limite el consumo de alcohol a no ms de 1 medida por da si es mujer y no est Orthoptistembarazada, y 2 medidas por da si es hombre. Una medida equivale a 12onzas de cerveza, 5onzas de vino o 1onzas de bebidas alcohlicas de alta  graduacin. Instrucciones generales  Lleve un diario de la prdida de Budpeso. Esto puede ayudarlo a Youth workermantener un registro de lo siguiente: ? Los alimentos que come. ? Los ejercicios que hace.  Tome los medicamentos de venta libre y los recetados solamente como se lo haya indicado el mdico.  Tome vitaminas y suplementos solamente como se lo haya indicado el mdico.  Considere participar en un grupo de apoyo. Su mdico puede ayudarlo con esto.  Concurra a todas las visitas de control como se lo haya indicado el mdico. Esto es importante. SOLICITE AYUDA SI:  No puede alcanzar su objetivo de prdida de peso despus de haber modificado su dieta y su estilo de vida durante 6semanas. Esta informacin no tiene Theme park managercomo fin reemplazar el consejo del mdico. Asegrese de hacerle al mdico cualquier pregunta que tenga. Document Released: 01/12/2012 Document Revised: 11/04/2015 Document Reviewed: 05/01/2015 Elsevier Interactive Patient Education  2018 [employer].

## 2017-09-16 LAB — COMPREHENSIVE METABOLIC PANEL
ALK PHOS: 94 IU/L (ref 39–117)
ALT: 34 IU/L — ABNORMAL HIGH (ref 0–32)
AST: 32 IU/L (ref 0–40)
Albumin/Globulin Ratio: 1.1 — ABNORMAL LOW (ref 1.2–2.2)
Albumin: 4 g/dL (ref 3.5–5.5)
BILIRUBIN TOTAL: 0.5 mg/dL (ref 0.0–1.2)
BUN/Creatinine Ratio: 13 (ref 9–23)
BUN: 7 mg/dL (ref 6–20)
CHLORIDE: 94 mmol/L — AB (ref 96–106)
CO2: 23 mmol/L (ref 20–29)
CREATININE: 0.54 mg/dL — AB (ref 0.57–1.00)
Calcium: 9.5 mg/dL (ref 8.7–10.2)
GFR calc Af Amer: 137 mL/min/{1.73_m2} (ref >59)
GFR calc non Af Amer: 119 mL/min/{1.73_m2} (ref >59)
GLOBULIN, TOTAL: 3.6 g/dL (ref 1.5–4.5)
GLUCOSE: 351 mg/dL — AB (ref 65–99)
Potassium: 4.5 mmol/L (ref 3.5–5.2)
SODIUM: 133 mmol/L — AB (ref 134–144)
Total Protein: 7.6 g/dL (ref 6.0–8.5)

## 2017-09-16 LAB — MICROALBUMIN / CREATININE URINE RATIO
CREATININE, UR: 144.8 mg/dL
MICROALBUM., U, RANDOM: 189.2 ug/mL
Microalb/Creat Ratio: 130.7 mg/g creat — ABNORMAL HIGH (ref 0.0–30.0)

## 2017-09-17 ENCOUNTER — Telehealth: Payer: Self-pay

## 2017-09-17 NOTE — Telephone Encounter (Signed)
-----   Message from Massie MaroonLachina M Hollis, OregonFNP sent at 09/16/2017  5:46 AM EST ----- Regarding: lab results Please inform patient of the importance of following medication regimen consistently. She has protein in urine, consistent with proteinuria. Take Lisinopril 5 mg daily as prescribed on yesterday. Recommend a lowfat, low carbohydrate diet divided over 5-6 small meals, increase water intake to 6-8 glasses, and 150 minutes per week of cardiovascular exercise.   Thanks

## 2017-09-17 NOTE — Telephone Encounter (Signed)
Called using language resources line. ID #[id number]. No answer and no voicemail was set up, unable to leave a message. Thanks!  

## 2017-09-18 NOTE — Progress Notes (Signed)
Chief Complaint  Patient presents with  . Hypertension  . Diabetes   Subjective:    Patient ID: Shelley Bradley, female    DOB: [birth year], 42 y.o.   MRN: [id number]  HPI A 42 year old female with a history of morbid obesity, type 2 diabetes mellitus, and hypertension presents for follow up of chronic conditions. Patient has been lost to follow up for greater than 1 year. She has mostly been using the emergency department for primary needs. She speaks spanish, using video interpreter to assist with communication.  Patient has a history of type 2 DM and HTN and has been without medications. She has not been following a carbohydrate diet or exercising routinely over the past several year. Her diet mostly consists of high fat foods and sugary drinks including soda. Patient denies blurred vision, dizziness, shortness of breath, chest pain, polyuria, polydipsia, or polyphagia.   Past Medical History:  Diagnosis Date  . Diabetes mellitus without complication (HCC)   . Morbid obesity (HCC)   . No pertinent past medical history   . Obese    Social History   Socioeconomic History  . Marital status: Married    Spouse name: Not on file  . Number of children: Not on file  . Years of education: Not on file  . Highest education level: Not on file  Social Needs  . Financial resource strain: Not on file  . Food insecurity - worry: Not on file  . Food insecurity - inability: Not on file  . Transportation needs - medical: Not on file  . Transportation needs - non-medical: Not on file  Occupational History  . Not on file  Tobacco Use  . Smoking status: Never Smoker  . Smokeless tobacco: Never Used  Substance and Sexual Activity  . Alcohol use: No  . Drug use: No  . Sexual activity: Yes    Birth control/protection: None  Other Topics Concern  . Not on file  Social History Narrative  . Not on file   Immunization History  Administered Date(s) Administered  . Tdap 10/29/2011,  11/23/2012      Review of Systems  Constitutional: Negative.   HENT: Negative.   Eyes: Negative.  Negative for visual disturbance.  Respiratory: Negative.   Cardiovascular: Negative.  Negative for chest pain, palpitations and leg swelling.  Endocrine: Negative for polydipsia, polyphagia and polyuria.  Genitourinary: Negative.   Musculoskeletal: Negative.   Skin: Negative.   Allergic/Immunologic: Negative for immunocompromised state.  Neurological: Negative.   Hematological: Negative.   Psychiatric/Behavioral: Negative.        Objective:   Physical Exam  Constitutional: She is oriented to person, place, and time. She appears well-developed and well-nourished.  Morbid obesity  HENT:  Head: Normocephalic and atraumatic.  Right Ear: External ear normal.  Left Ear: External ear normal.  Mouth/Throat: Oropharynx is clear and moist.  Eyes: Pupils are equal, round, and reactive to light.  Neck: Normal range of motion.  Pulmonary/Chest: Effort normal and breath sounds normal.  Abdominal: Soft. Bowel sounds are normal.  Increased abdominal girth  Musculoskeletal: Normal range of motion.  Neurological: She is alert and oriented to person, place, and time.  Skin: Skin is warm and dry.  Psychiatric: She has a normal mood and affect. Her behavior is normal. Judgment and thought content normal.      BP 131/85 (BP Location: Right Arm, Patient Position: Sitting, Cuff Size: Large)   Pulse (!) 110   Temp 98.6 F (37 C) (  Oral)   Resp 16   Ht 5\' 2"  (1.575 m)   Wt (!) 375 lb (170.1 kg)   LMP 09/03/2017   SpO2 99%   BMI 68.59 kg/m  Assessment & Plan:  1. Type 2 diabetes mellitus without complication, without long-term current use of insulin (HCC) Hemoglobin a1C is 11.9 which is above goal.  According to ADA, goal is < 7.  Will restart Metformin 1000 mg BID and Januvia 50 mg daily.  Recommend a lowfat, low carbohydrate diet divided over 5-6 small meals, increase water intake to 6-8  glasses, and 150 minutes per week of cardiovascular exercise.  Patient to bring glucometer to follow up appointment - HgB A1c - metFORMIN (GLUCOPHAGE) 1000 MG tablet; Take 1 tablet (1,000 mg total) by mouth 2 (two) times daily with a meal.  Dispense: 60 tablet; Refill: 5 - sitaGLIPtin (JANUVIA) 50 MG tablet; Take 1 tablet (50 mg total) by mouth daily.  Dispense: 30 tablet; Refill: 5 - lisinopril (PRINIVIL,ZESTRIL) 5 MG tablet; Take 1 tablet (5 mg total) by mouth daily.  Dispense: 30 tablet; Refill: 5 - Blood Glucose Monitoring Suppl (TRUE METRIX AIR GLUCOSE METER) DEVI; 1 each by Does not apply route 4 (four) times daily -  before meals and at bedtime.  Dispense: 1 Device; Refill: 0 - glucose blood (TRUE METRIX BLOOD GLUCOSE TEST) test strip; Use as instructed  Dispense: 100 each; Refill: 12 - Comprehensive metabolic panel - Microalbumin/Creatinine Ratio, Urine - Lancets MISC; 1 each by Does not apply route 4 (four) times daily -  before meals and at bedtime.  Dispense: 100 each; Refill: 5  2. Essential hypertension Will start Lisinipril 5 mg daily, monitor blood pressure at home. Continue DASH diet. Reminder to go to the ER if any CP, SOB, nausea, dizziness, severe HA, changes vision/speech, left arm numbness and tingling and jaw pain.  - lisinopril (PRINIVIL,ZESTRIL) 5 MG tablet; Take 1 tablet (5 mg total) by mouth daily.  Dispense: 30 tablet; Refill: 5 - Comprehensive metabolic panel - POCT urinalysis dip (device)  3. Morbid obesity with BMI of 60.0-69.9, adult [facility](HCC) The patient is asked to make an attempt to improve diet and exercise patterns to aid in medical management of this problem. - POCT urinalysis dip (device)  4. Language barrier to communication Patient speaks spanish, used video interpreter to assist with communication  Greater than 50% of appointment spent face to face discussing medication regimen, diet, and exercise routine.    RTC: 1 month for DMII and  HTN   Anahit Klumb Rennis PettyMoore Niyah Mamaril  MSN, FNP-C Patient [facility] [address]  [place], [place] [id number] [phone]

## 2017-09-20 IMAGING — CR DG CHEST 2V
2 series · 2 of 2 positions shown · non-contrast
Comparison: 04/22/2016

CLINICAL DATA: Mid chest pain tonight.

EXAM:
CHEST  2 VIEW

[chest pa]
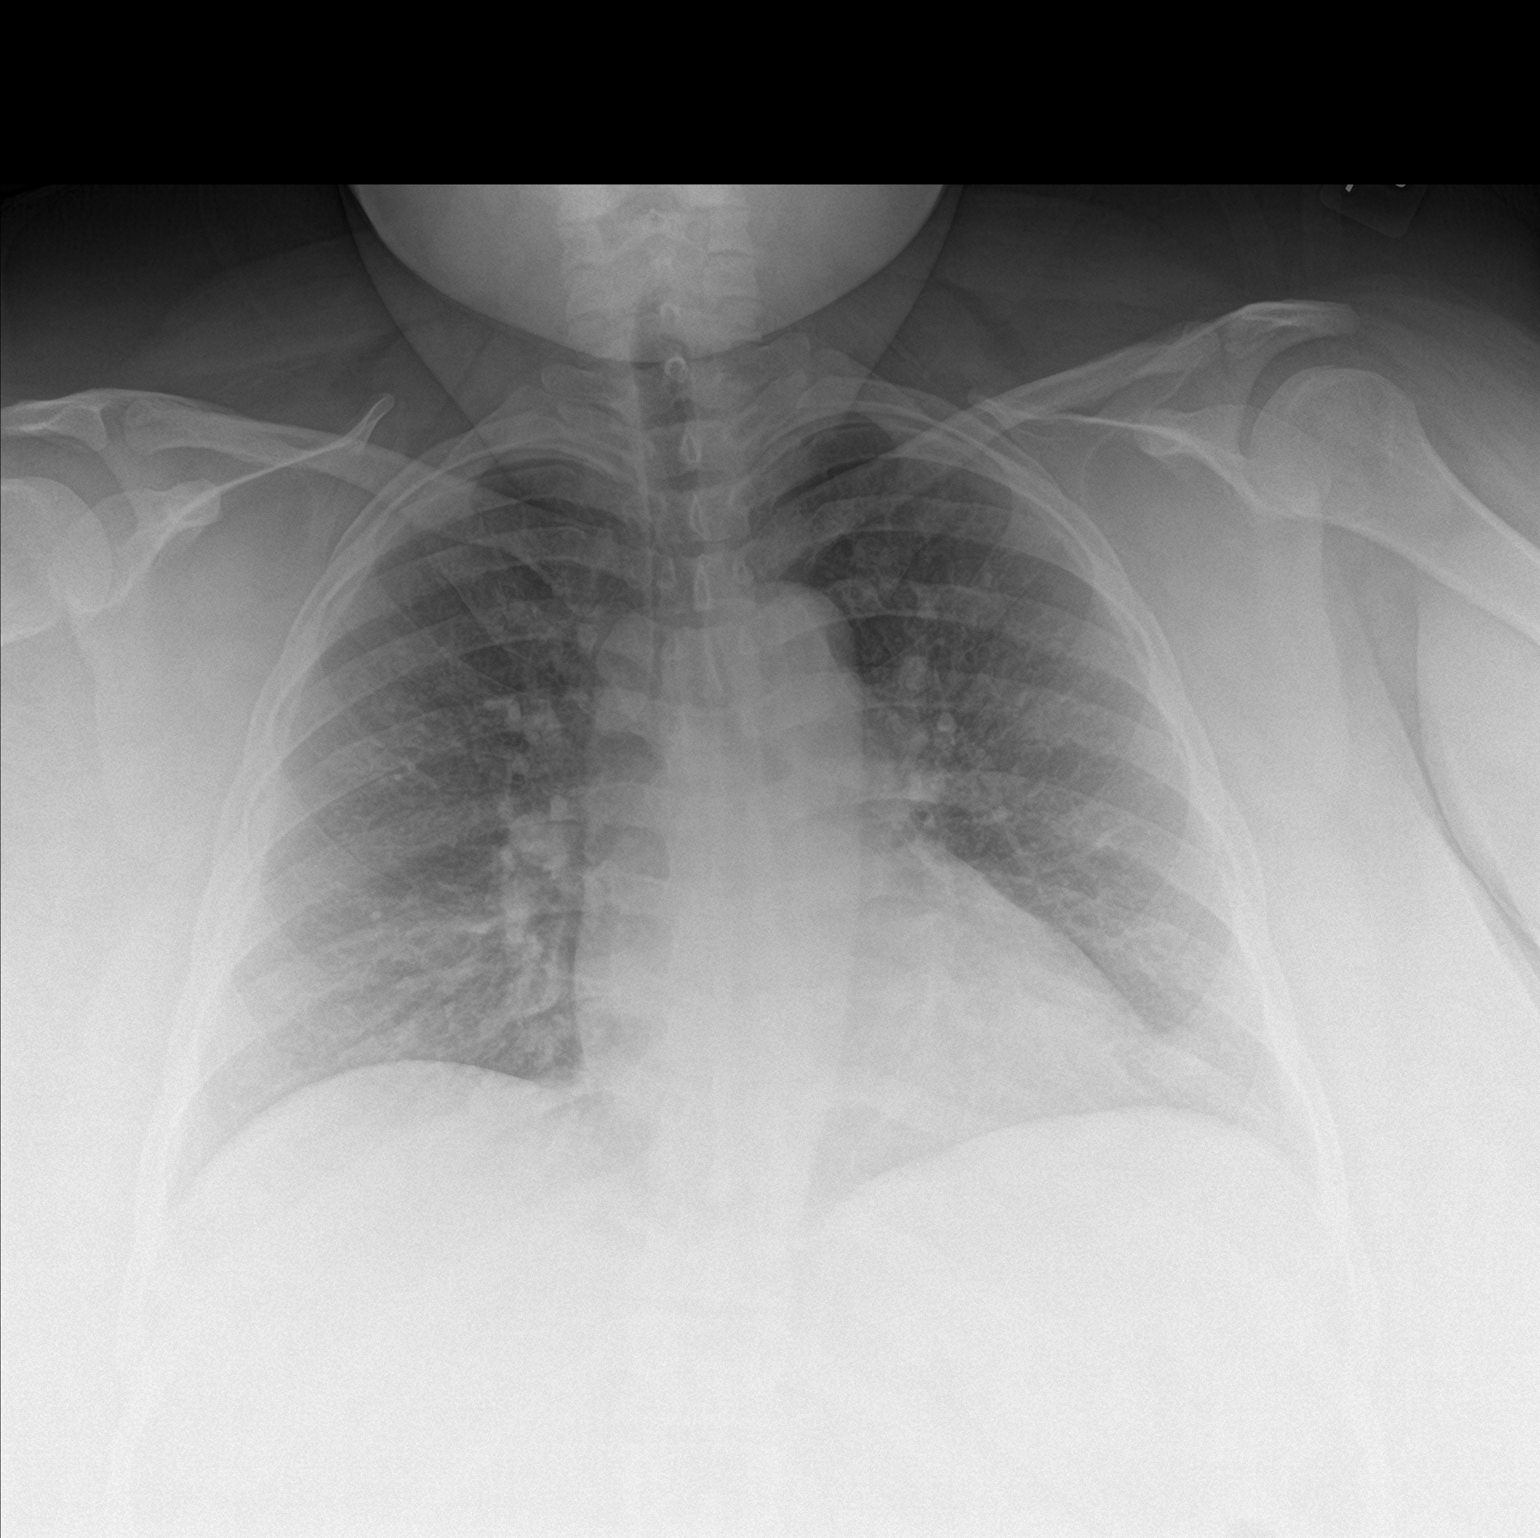

[chest lat]
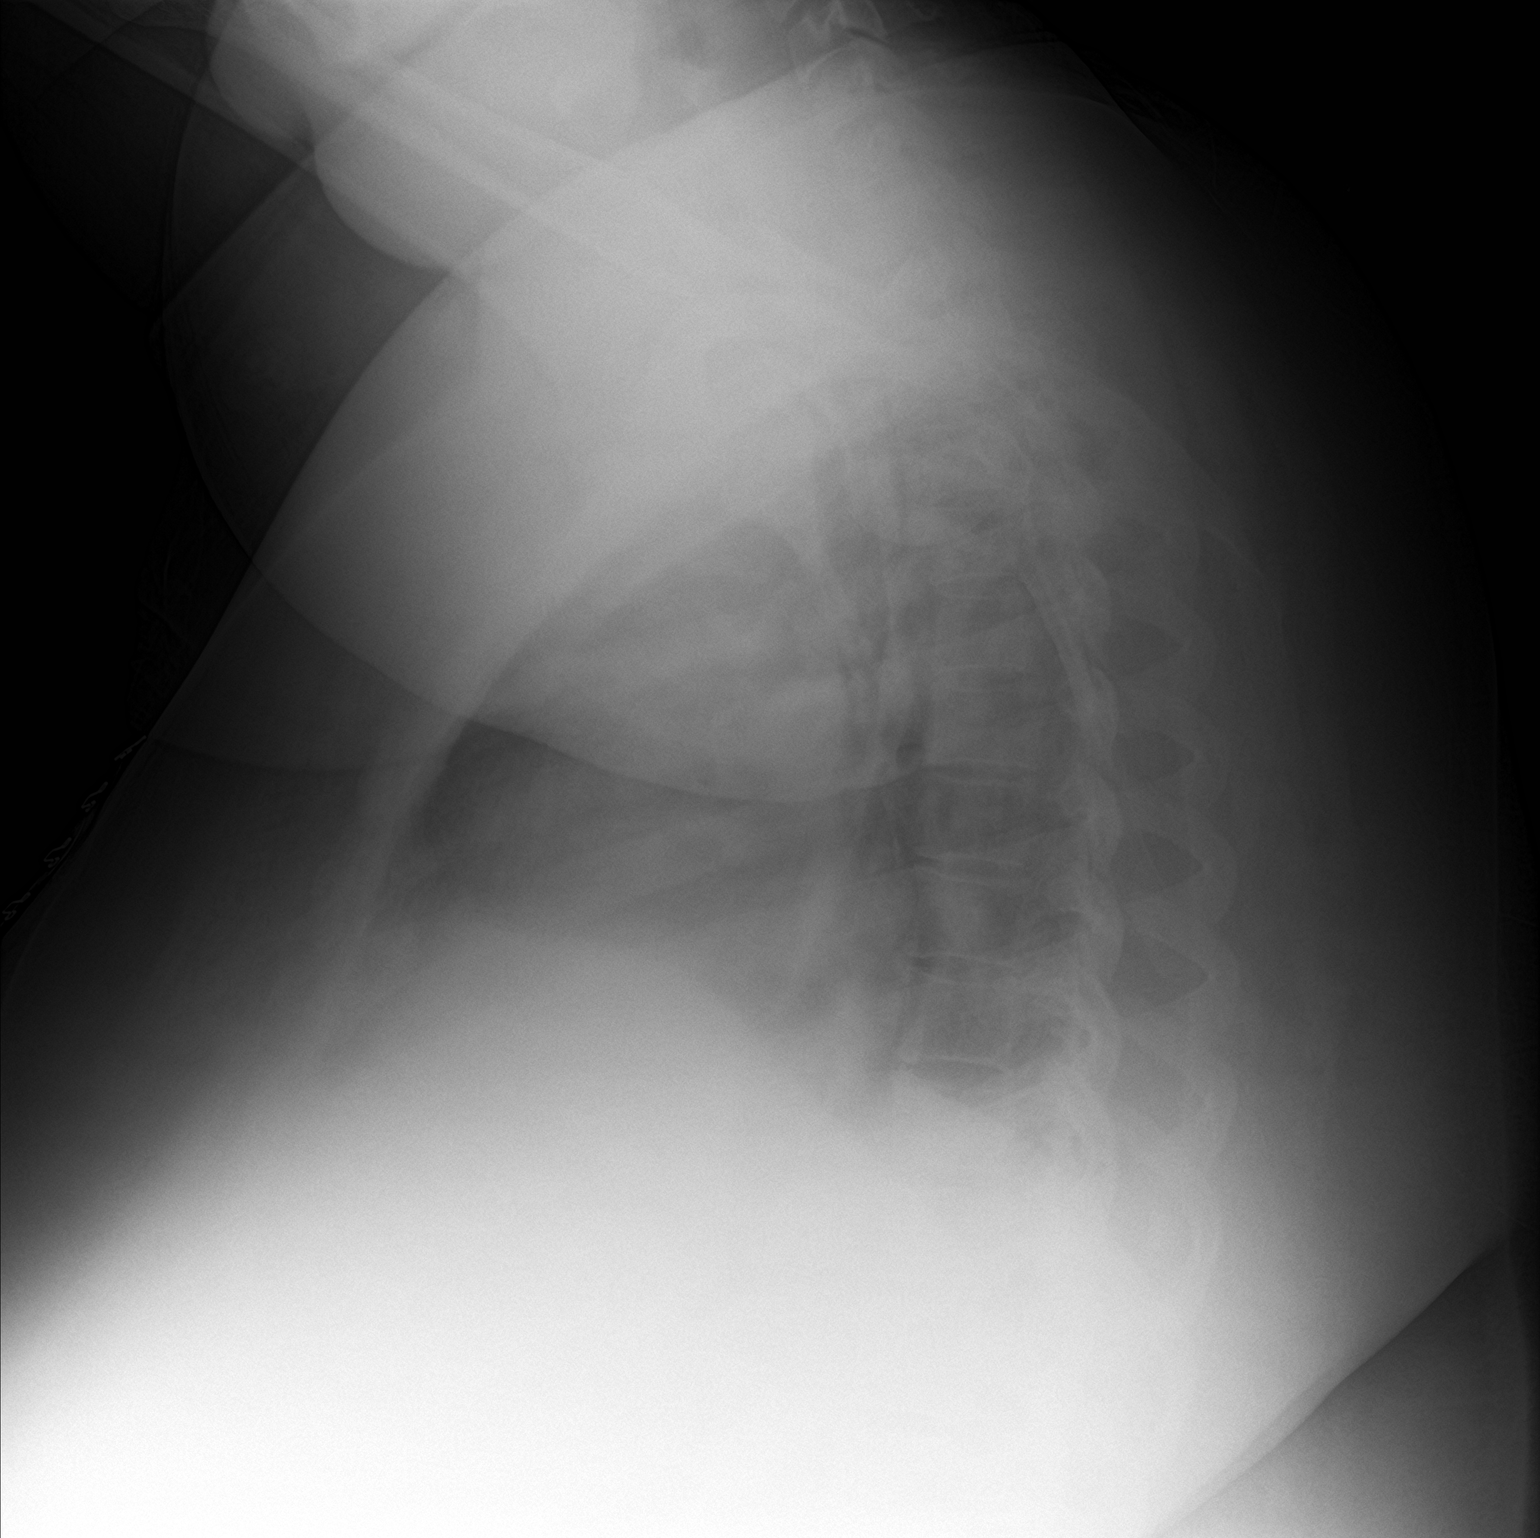

[2 of 2 positions shown; findings below may reference images not displayed]

FINDINGS: The cardiomediastinal contours are normal. The lungs are clear.
Pulmonary vasculature is normal. No consolidation, pleural effusion,
or pneumothorax. No acute osseous abnormalities are seen.
IMPRESSION: No acute abnormality.

## 2017-09-20 NOTE — Telephone Encounter (Signed)
Called using language resources ID#[id number]. NO answer and no voicemail was unable to leave a message. Thanks!  

## 2017-09-21 NOTE — Telephone Encounter (Signed)
Unable to reach patient by phone. I have mailed a letter to patient. Thanks!

## 2017-11-03 ENCOUNTER — Encounter: Payer: Self-pay | Admitting: Obstetrics & Gynecology

## 2017-11-03 ENCOUNTER — Ambulatory Visit (INDEPENDENT_AMBULATORY_CARE_PROVIDER_SITE_OTHER): Payer: Self-pay | Admitting: Obstetrics & Gynecology

## 2017-11-03 VITALS — BP 130/77 | HR 94 | Ht 61.0 in | Wt 374.5 lb

## 2017-11-03 DIAGNOSIS — N938 Other specified abnormal uterine and vaginal bleeding: Secondary | ICD-10-CM

## 2017-11-03 NOTE — Progress Notes (Signed)
Patient ID: Shelley Bradley, female   DOB: 12/25/1975, 42 y.o.   MRN: [id number]  Chief Complaint  Patient presents with  . Menorrhagia    HPI Shelley Fitchancy Balanzar Bradley is a 42 y.o. female.  P4 married HPI She is here with DUB, bleeds up to 2 months at a time. U/s normal except for a 22 mm endometrium. Periods were normal about 4 years ago.  Past Medical History:  Diagnosis Date  . Diabetes mellitus without complication (HCC)   . Morbid obesity (HCC)   . No pertinent past medical history   . Obese     Past Surgical History:  Procedure Laterality Date  . NO PAST SURGERIES      Family History  Problem Relation Age of Onset  . Heart disease Mother   . Diabetes Mother   . Diabetes Father   . Breast cancer Sister   . Breast cancer Paternal Grandmother   . Anesthesia problems Neg Hx   . Hypotension Neg Hx   . Malignant hyperthermia Neg Hx   . Pseudochol deficiency Neg Hx     Social History Social History   Tobacco Use  . Smoking status: Never Smoker  . Smokeless tobacco: Never Used  Substance Use Topics  . Alcohol use: No  . Drug use: No    No Known Allergies  Current Outpatient Medications  Medication Sig Dispense Refill  . Blood Glucose Monitoring Suppl (TRUE METRIX AIR GLUCOSE METER) DEVI 1 each by Does not apply route 4 (four) times daily -  before meals and at bedtime. 1 Device 0  . glucose blood (TRUE METRIX BLOOD GLUCOSE TEST) test strip Use as instructed 100 each 12  . Lancets MISC 1 each by Does not apply route 4 (four) times daily -  before meals and at bedtime. 100 each 5  . lisinopril (PRINIVIL,ZESTRIL) 5 MG tablet Take 1 tablet (5 mg total) by mouth daily. 30 tablet 5  . sitaGLIPtin (JANUVIA) 50 MG tablet Take 1 tablet (50 mg total) by mouth daily. 30 tablet 5   No current facility-administered medications for this visit.     Review of Systems Review of Systems Doesn't use contraception, would like a pregnancy She reports [facility]  Blood pressure 130/77, pulse 94, height 5\' 1"  (1.549 m), weight (!) 374 lb 8 oz (169.9 kg), last menstrual period 08/10/2017.  Physical Exam Physical Exam Breathing, conversing, and ambulating normally Well nourished, well hydrated Hispanic female, no apparent distress Live interpretor here for exam  Data Reviewed IMPRESSION: 1. Endometrium measures 22 mm. If bleeding remains unresponsive to hormonal or medical therapy, focal lesion work-up with sonohysterogram should be considered. Endometrial biopsy should also be considered in pre-menopausal patients at high risk for endometrial carcinoma. (Ref: Radiological Reasoning: Algorithmic Workup of Abnormal Vaginal Bleeding with Endovaginal Sonography and Sonohysterography. AJR 2008; 409:[id number]:S68-73) 2. Incompletely visualized cystic adnexal masses measuring up to 6.3 cm. Given the large size and the difficulty with visualization on ultrasound, recommend further evaluation with pelvic MRI for definitive characterization.  Assessment    DUB    Plan    I have given her the time, date and place for free pap next Monday Schedule EMBX (rec IBU 800mg  prior to visit)       Burdette Forehand C Beverlyann Broxterman 11/03/2017, 11:24 AM

## 2017-11-03 NOTE — Progress Notes (Signed)
[employer] interpreter  

## 2017-11-08 ENCOUNTER — Other Ambulatory Visit: Payer: Self-pay

## 2017-11-10 LAB — CYTOLOGY - PAP: DIAGNOSIS: NEGATIVE

## 2017-11-26 ENCOUNTER — Other Ambulatory Visit (HOSPITAL_COMMUNITY)
Admission: RE | Admit: 2017-11-26 | Discharge: 2017-11-26 | Disposition: A | Discharge status: Home / Self Care | Payer: Self-pay | Source: Ambulatory Visit | Attending: Obstetrics & Gynecology | Admitting: Obstetrics & Gynecology

## 2017-11-26 ENCOUNTER — Ambulatory Visit: Payer: Self-pay | Admitting: Obstetrics & Gynecology

## 2017-11-26 VITALS — BP 118/76 | HR 86

## 2017-11-26 DIAGNOSIS — N938 Other specified abnormal uterine and vaginal bleeding: Secondary | ICD-10-CM

## 2017-11-26 DIAGNOSIS — R112 Nausea with vomiting, unspecified: Secondary | ICD-10-CM

## 2017-11-26 LAB — POCT PREGNANCY, URINE: Preg Test, Ur: NEGATIVE

## 2017-11-26 NOTE — Progress Notes (Signed)
   Subjective:    Patient ID: Shelley Bradley, female    DOB: 09-Oct-1975, 42 y.o.   MRN: [id number]  HPI 42 yo G0 here for [facility] due to 22 mm endometrium found on u/s done for evaluation of DUB. She complains of nausea and vomiting for 3 weeks.  Review of Systems Pap normal 4/19    Objective:   Physical Exam  Video interpreter used for exam  Breathing, conversing, and ambulating normally Well nourished, well hydrated Latina, no apparent distress  UPT negative, consent signed, time out done Cervix prepped with betadine and grasped with a single tooth tenaculum Uterus sounded to 10 cm Pipelle used for 3 passes with a moderate amount of tissue obtained. She tolerated the procedure well.      Assessment & Plan:  DUB- await pathology Come back 3 weeks Referral to fam med

## 2017-11-30 ENCOUNTER — Encounter: Payer: Self-pay | Admitting: *Deleted

## 2017-12-14 ENCOUNTER — Ambulatory Visit: Payer: Self-pay | Admitting: Obstetrics & Gynecology

## 2017-12-15 ENCOUNTER — Ambulatory Visit: Payer: Self-pay | Admitting: Family Medicine

## 2018-01-06 ENCOUNTER — Ambulatory Visit: Payer: Self-pay | Admitting: Family Medicine

## 2018-01-13 ENCOUNTER — Other Ambulatory Visit: Payer: Self-pay | Admitting: Obstetrics and Gynecology

## 2018-01-13 DIAGNOSIS — Z1231 Encounter for screening mammogram for malignant neoplasm of breast: Secondary | ICD-10-CM

## 2018-02-03 ENCOUNTER — Ambulatory Visit (HOSPITAL_COMMUNITY): Payer: Self-pay

## 2018-02-08 ENCOUNTER — Ambulatory Visit (HOSPITAL_COMMUNITY): Payer: Self-pay

## 2018-05-03 ENCOUNTER — Other Ambulatory Visit (HOSPITAL_COMMUNITY): Payer: Self-pay | Admitting: *Deleted

## 2018-05-03 DIAGNOSIS — Z1231 Encounter for screening mammogram for malignant neoplasm of breast: Secondary | ICD-10-CM

## 2018-05-05 ENCOUNTER — Ambulatory Visit (HOSPITAL_COMMUNITY): Payer: Self-pay

## 2018-07-28 ENCOUNTER — Ambulatory Visit (HOSPITAL_COMMUNITY): Payer: Self-pay

## 2018-10-10 ENCOUNTER — Telehealth: Payer: Self-pay | Admitting: Family Medicine

## 2018-10-10 NOTE — Telephone Encounter (Signed)
Called patient to inform her of the office restrictions due to the coronavirus with spanish interpreter ID# [phone]. A lady answered and stated that we had the wrong number per interpreter. No message left since it was the wrong number.

## 2018-10-11 ENCOUNTER — Ambulatory Visit: Payer: Self-pay | Admitting: Obstetrics & Gynecology

## 2018-11-30 ENCOUNTER — Other Ambulatory Visit: Payer: Self-pay

## 2018-11-30 MED ORDER — TERCONAZOLE 0.4 % VA CREA: 1.0000 | TOPICAL_CREAM | Freq: Every day | VAGINAL | 0 refills | Status: DC

## 2018-11-30 NOTE — Telephone Encounter (Signed)
Pt stated that she is having itchy discharge which she is a yeast infection.  I informed pt that I will send in Terazol cream to help tx the yeast infection.  Pt stated understanding with no further questions.

## 2019-03-14 ENCOUNTER — Telehealth: Payer: Self-pay

## 2019-03-14 NOTE — Telephone Encounter (Signed)
Pt called into the nurse voicemail line and our Interpreter Eda picked up the call and stated patient stated that she has a yeast infection and wants to come in to be swabbed. Advised that we can get her in on the nurse visit schedule. Will schedule and call back with interpreter.

## 2019-03-15 ENCOUNTER — Other Ambulatory Visit: Payer: Self-pay

## 2019-03-15 ENCOUNTER — Ambulatory Visit (INDEPENDENT_AMBULATORY_CARE_PROVIDER_SITE_OTHER): Payer: Self-pay

## 2019-03-15 ENCOUNTER — Other Ambulatory Visit: Payer: Self-pay | Admitting: Obstetrics & Gynecology

## 2019-03-15 DIAGNOSIS — N760 Acute vaginitis: Secondary | ICD-10-CM

## 2019-03-15 DIAGNOSIS — Z113 Encounter for screening for infections with a predominantly sexual mode of transmission: Secondary | ICD-10-CM

## 2019-03-15 DIAGNOSIS — B373 Candidiasis of vulva and vagina: Secondary | ICD-10-CM

## 2019-03-15 DIAGNOSIS — N898 Other specified noninflammatory disorders of vagina: Secondary | ICD-10-CM

## 2019-03-15 DIAGNOSIS — B9689 Other specified bacterial agents as the cause of diseases classified elsewhere: Secondary | ICD-10-CM

## 2019-03-15 MED ORDER — FLUCONAZOLE 150 MG PO TABS: 150.0000 mg | ORAL_TABLET | Freq: Once | ORAL | 0 refills | Status: AC

## 2019-03-15 NOTE — Progress Notes (Signed)
With Spanish interpreter Eda R., Pt reports having a lot of discharge and a lot of itching.  With clumpy yellowish discharge.  Pt explained how to obtain self swab and that we will call with abnormal results.  I explained to the pt that I can go ahead and order Diflucan and if something else different needs to be treated we will call her.  Pt verbalized understanding.   Frances Nickels 03/15/19

## 2019-03-17 LAB — CERVICOVAGINAL ANCILLARY ONLY
Bacterial vaginitis: POSITIVE — AB
Candida vaginitis: POSITIVE — AB
Chlamydia: NEGATIVE
Neisseria Gonorrhea: NEGATIVE
Trichomonas: NEGATIVE

## 2019-03-20 ENCOUNTER — Telehealth (INDEPENDENT_AMBULATORY_CARE_PROVIDER_SITE_OTHER): Payer: Self-pay | Admitting: Lactation Services

## 2019-03-20 ENCOUNTER — Other Ambulatory Visit: Payer: Self-pay | Admitting: Obstetrics and Gynecology

## 2019-03-20 DIAGNOSIS — N898 Other specified noninflammatory disorders of vagina: Secondary | ICD-10-CM

## 2019-03-20 MED ORDER — METRONIDAZOLE 500 MG PO TABS: 500.0000 mg | ORAL_TABLET | Freq: Two times a day (BID) | ORAL | 1 refills | Status: AC

## 2019-03-20 MED ORDER — FLUCONAZOLE 150 MG PO TABS: ORAL_TABLET | ORAL | 1 refills | Status: DC

## 2019-03-20 NOTE — Telephone Encounter (Signed)
-----   Message from Aletha Halim, MD sent at 03/20/2019  1:29 PM EDT ----- Can you let her know I sent in flagyl and diflucan? thanks

## 2019-03-20 NOTE — Telephone Encounter (Signed)
Called pt with assistance of [place] # [phone] to inform her of lab results. Pt initially hung up and was called back.   Informed pt that she has BV and Yeast infections and prescriptions have been called into her Pharmacy. Informed pt to take Flagyl first and then take Diflucan.   Pt questions were answered. Pt to call the office with further questions/concerns as needed.

## 2019-03-22 NOTE — Progress Notes (Signed)
Patient seen and assessed by nursing staff during this encounter. I have reviewed the chart and agree with the documentation and plan.  Danon Lograsso, MD 03/22/2019 2:29 PM    

## 2019-04-12 ENCOUNTER — Telehealth: Payer: Self-pay | Admitting: General Practice

## 2019-04-12 DIAGNOSIS — N938 Other specified abnormal uterine and vaginal bleeding: Secondary | ICD-10-CM

## 2019-04-12 MED ORDER — MEGESTROL ACETATE 40 MG PO TABS: 40.0000 mg | ORAL_TABLET | Freq: Every day | ORAL | 1 refills | Status: DC

## 2019-04-12 NOTE — Telephone Encounter (Signed)
Patient called and left message on nurse voicemail line requesting a refill on the medication for bleeding. Per chart review, patient has not been seen since 11/2017.  Called patient with Shelley Bradley for interpreter and patient reports bleeding every day for the past 3 weeks with passing of clots. Patient states this happens a couple times a year and the only way the bleeding stops is with the medication. Discussed with Shelley Bradley who prescribed Megace 40mg  daily with 1 refill. Informed patient and discussed she needs to come in and be seen before future refills will be given after this. Patient verbalized understanding & had no questions.

## 2019-06-02 ENCOUNTER — Ambulatory Visit: Payer: Self-pay | Admitting: Obstetrics & Gynecology

## 2019-06-02 ENCOUNTER — Encounter: Payer: Self-pay | Admitting: Obstetrics & Gynecology

## 2020-06-25 ENCOUNTER — Telehealth: Payer: Self-pay | Admitting: Family Medicine

## 2020-06-25 NOTE — Telephone Encounter (Signed)
Spanish--pt calling in to states that she has been vomiting and dizzy for about a week and needs to speak with RN to see if she needs to come in or go to ER. Pt states that she took pregnancy test at home and it was neg. but with previous child only showed up with blood test. Pt request a call back. thanks

## 2020-06-26 NOTE — Telephone Encounter (Signed)
Returned patients call with assistance of [employer], Research officer, trade union. Patient did not answer and not able to leave a voicemail as voicemail not set up.

## 2021-05-15 ENCOUNTER — Encounter: Payer: Self-pay | Admitting: *Deleted

## 2021-05-20 ENCOUNTER — Encounter: Payer: Self-pay | Admitting: Family Medicine

## 2021-05-20 ENCOUNTER — Other Ambulatory Visit: Payer: Self-pay

## 2021-05-20 ENCOUNTER — Ambulatory Visit (INDEPENDENT_AMBULATORY_CARE_PROVIDER_SITE_OTHER): Payer: Self-pay | Admitting: Family Medicine

## 2021-05-20 VITALS — BP 114/60 | HR 84 | Wt 329.7 lb

## 2021-05-20 DIAGNOSIS — Z789 Other specified health status: Secondary | ICD-10-CM

## 2021-05-20 DIAGNOSIS — Z6841 Body Mass Index (BMI) 40.0 and over, adult: Secondary | ICD-10-CM

## 2021-05-20 DIAGNOSIS — E119 Type 2 diabetes mellitus without complications: Secondary | ICD-10-CM

## 2021-05-20 DIAGNOSIS — N83201 Unspecified ovarian cyst, right side: Secondary | ICD-10-CM

## 2021-05-20 DIAGNOSIS — Z1231 Encounter for screening mammogram for malignant neoplasm of breast: Secondary | ICD-10-CM

## 2021-05-20 DIAGNOSIS — N83202 Unspecified ovarian cyst, left side: Secondary | ICD-10-CM

## 2021-05-20 LAB — HEMOGLOBIN A1C
Est. average glucose Bld gHb Est-mCnc: 189 mg/dL
Hgb A1c MFr Bld: 8.2 % — ABNORMAL HIGH (ref 4.8–5.6)

## 2021-05-20 NOTE — Progress Notes (Signed)
Patient stated that she recently went to the hospital due to pain on left side and they told her that she has "cysts in ovary that was the size of an orange". Denies any current pain or bleeding. I also informed her that she is over due for a pap smear and mammogram and that I will refer her to a place where she could possibly get them for free

## 2021-05-20 NOTE — Progress Notes (Signed)
GYNECOLOGY OFFICE VISIT NOTE  History:   Shelley Bradley is a 45 y.o. [phone] here today for evaluation of bilateral ovarian cysts.  She was seen in the ED in 02/2021 due to lower abdominal pain.  CT abdomen and US pelvis obtained showing bilateral large adnexal cystic lesions, right measuring 15.7 X10.3X 9.0 cm and left measuring 7.6 X5.5X 6.6.  No characterization of the cyst was provided on care everywhere read of ultrasound.  Endometrium 7 mm.  Reports that she no longer has had any abdominal pain, states its been at least a month since she has experienced discomfort.  No known family or personal history of breast, ovarian, or cervical cancer.  Eating and drinking well.  Bowel movements regular.  LMP 02/2021, known history of irregular periods.  She is not sexually active.  Through chart review, note that she has had extensive history of intermittent ovarian cysts of varying sizes.  Previously was referred to [facility] to discuss surgical vs expectant management, however does not appear that she met with them.   An iPad Spanish interpreter was used for the duration of the visit.   Past Medical History:  Diagnosis Date   Diabetes mellitus without complication ([place])    Morbid obesity ([place])    No pertinent past medical history    Obese     Past Surgical History:  Procedure Laterality Date   NO PAST SURGERIES      The following portions of the patient's history were reviewed and updated as appropriate: allergies, current medications, past family history, past medical history, past social history, past surgical history and problem list.   Health Maintenance:  Normal pap in 10/2017.  Review of Systems:  Pertinent items noted in HPI and remainder of comprehensive ROS otherwise negative.  Physical Exam:  BP 114/60   Pulse 84   Wt (!) 329 lb 11.2 oz (149.6 kg)   BMI 62.30 kg/m  CONSTITUTIONAL: Well-developed, well-nourished female in no acute distress.  HEENT:  Normocephalic,  atraumatic. External right and left ear normal. No scleral icterus.  NECK: Normal range of motion, supple SKIN: No rash noted. Not diaphoretic. No erythema. No pallor. MUSCULOSKELETAL: Normal range of motion. No edema noted. NEUROLOGIC: Alert and oriented to person, place, and time.  PSYCHIATRIC: Normal mood and affect. Normal behavior.  CARDIOVASCULAR: Normal heart rate noted RESPIRATORY: Effort and breath sounds normal, no problems with respiration noted ABDOMEN: Exam challenged by body habitus.  Soft and nondistended.  Nontender to palpation in all 4 quadrants.  No overt masses noted.  Normoactive bowel sounds throughout. PELVIC: Deferred  Labs and Imaging No results found.    Assessment and Plan:  1. Bilateral ovarian cysts Currently asymptomatic. Through chart review, appears to have an extensive history of intermittent left and right adnexal cysts since at least 2009, usually benign features in the past.  Last U/S in 02/2021, will repeat to assess if consistent size and for improved characterization.  May need to consider gyn/onc referral pending results, would certainly be a high surgical risk.  Currently without insurance, MRI may be too costly. - US PELVIC COMPLETE WITH TRANSVAGINAL; Future  2. Type 2 diabetes mellitus without complication, without long-term current use of insulin ([place]) Documented in problem list, however patient is under the impression that she no longer has diabetes and has not been on medication or following with provider for this.  Noted her glucose during 02/2021 ED visit was 70, suspect she still is diabetic.  Encouraged her to  reestablish with primary care, she will likely need to be started on medication following A1c. - Hemoglobin A1c  3. BMI 60.0-69.9, adult ([place])  4. Language barrier to communication Spanish interpreter used.    Routine preventative health maintenance measures emphasized.  Given information to get set up with [facility]CP for her Pap  smear/mammogram.  Return [place] after [place] with ob/gyn.     Darrelyn Hillock, DO  OB Fellow, [place] for [place] 05/21/2021 9:32 PM

## 2021-05-21 ENCOUNTER — Encounter: Payer: Self-pay | Admitting: Family Medicine

## 2021-05-26 ENCOUNTER — Telehealth: Payer: Self-pay | Admitting: Family Medicine

## 2021-05-26 ENCOUNTER — Other Ambulatory Visit: Payer: Self-pay | Admitting: Family Medicine

## 2021-05-26 DIAGNOSIS — E119 Type 2 diabetes mellitus without complications: Secondary | ICD-10-CM

## 2021-05-26 MED ORDER — METFORMIN HCL ER 500 MG PO TB24
500.0000 mg | ORAL_TABLET | Freq: Every day | ORAL | 1 refills | Status: DC
Start: 2021-05-26 — End: 2024-03-28

## 2021-05-26 NOTE — Telephone Encounter (Signed)
Called patient with spanish interpreter.   Discussed her A1c of 8.2%, that she indeed still does have diabetes. Recommended starting on metformin, which she initially was hesitant to do. She was worried that medication would only make her disease worse, educated that this is not the case.   Extensively discussed that a well balanced diet, increasing her physical activity, and optimally working towards weight loss will make a significant future impact in her disease process.   Rx'd metformin XL 500mg  daily, to increase to 1000mg  if tolerating well. Eventually should make her way to 2000mg  if possible. Discussed common SE including nausea, GI upset, and diarrhea that should improve with time.   Recommended again to re-establish with her PCP with a follow up appt in the next month or sooner if needed. Can additionally follow up with myself if needed. All questions answered.   , DO

## 2021-05-27 ENCOUNTER — Ambulatory Visit (HOSPITAL_COMMUNITY): Admission: RE | Admit: 2021-05-27 | Payer: Self-pay | Source: Ambulatory Visit

## 2021-06-03 NOTE — Progress Notes (Deleted)
   GYNECOLOGY OFFICE VISIT NOTE  History:   Shelley Bradley is a 45 y.o. [phone] here today for follow up from her ovarian cyst. .   She denies any abnormal vaginal discharge, bleeding, pelvic pain or other concerns.     Past Medical History:  Diagnosis Date   Diabetes mellitus without complication (HCC)    Morbid obesity (HCC)    No pertinent past medical history    Obese     Past Surgical History:  Procedure Laterality Date   NO PAST SURGERIES      The following portions of the patient's history were reviewed and updated as appropriate: allergies, current medications, past family history, past medical history, past social history, past surgical history and problem list.   Health Maintenance:   Normal pap 10/2017 - HPV testing not done Diagnosis  Date Value Ref Range Status  11/08/2017   Final   NEGATIVE FOR INTRAEPITHELIAL LESIONS OR MALIGNANCY.     Normal mammogram in 2017.   Review of Systems:  Pertinent items noted in HPI and remainder of comprehensive ROS otherwise negative.  Physical Exam:  There were no vitals taken for this visit. CONSTITUTIONAL: Well-developed, well-nourished female in no acute distress.  HEENT:  Normocephalic, atraumatic. External right and left ear normal. No scleral icterus.  NECK: Normal range of motion, supple, no masses noted on observation SKIN: No rash noted. Not diaphoretic. No erythema. No pallor. MUSCULOSKELETAL: Normal range of motion. No edema noted. NEUROLOGIC: Alert and oriented to person, place, and time. Normal muscle tone coordination. No cranial nerve deficit noted. PSYCHIATRIC: Normal mood and affect. Normal behavior. Normal judgment and thought content.  CARDIOVASCULAR: Normal heart rate noted RESPIRATORY: Effort and breath sounds normal, no problems with respiration noted ABDOMEN: No masses noted. No other overt distention noted.    PELVIC: {Blank single:19197::"Deferred","Normal appearing external genitalia;  normal urethral meatus; normal appearing vaginal mucosa and cervix.  No abnormal discharge noted.  Normal uterine size, no other palpable masses, no uterine or adnexal tenderness. Performed in the presence of a chaperone"}  Labs and Imaging 2019: 5-6 cm bilateral cystic masses of the right and left ovaries.  2022: 16cm right cyst and 8cm light cyst Assessment and Plan:  Diagnoses and all orders for this visit:  Cysts of both ovaries - She has bilateral cystic structures in her ovaries. They are both about 5-6 cm. This imaging was from 2019.  - Imaging in 2022: right adnexal cyst is 16 cm in largest dimension and the left is now almost 8 cm. Suspect cystadenomas as no ascites or LAN.  - Ultimately due to BMI, medical complexity and size of the ovarian cysts, recommend referral to gyn onc.    Routine preventative health maintenance measures emphasized. Please refer to After Visit Summary for other counseling recommendations.   No follow-ups on file.  I spent {Blank single:19197::"10","15","20","25","30"} minutes dedicated to the care of this patient including pre-visit review of records, face to face time with the patient discussing her conditions and treatments and post visit orders.    Milas Hock, MD, FACOG Obstetrician & Gynecologist, [facility] for [facility], [facility]

## 2021-06-04 ENCOUNTER — Ambulatory Visit: Payer: Self-pay | Admitting: Obstetrics and Gynecology

## 2021-06-04 DIAGNOSIS — N83201 Unspecified ovarian cyst, right side: Secondary | ICD-10-CM

## 2021-07-10 ENCOUNTER — Ambulatory Visit: Payer: Self-pay

## 2021-07-10 ENCOUNTER — Inpatient Hospital Stay: Admission: RE | Admit: 2021-07-10 | Payer: Self-pay | Source: Ambulatory Visit

## 2021-09-11 ENCOUNTER — Other Ambulatory Visit: Payer: Self-pay

## 2021-09-11 ENCOUNTER — Ambulatory Visit: Payer: Self-pay

## 2021-09-11 ENCOUNTER — Encounter (INDEPENDENT_AMBULATORY_CARE_PROVIDER_SITE_OTHER): Payer: Self-pay

## 2021-09-11 ENCOUNTER — Ambulatory Visit: Payer: Self-pay | Admitting: *Deleted

## 2021-09-11 VITALS — BP 118/68 | Wt 321.9 lb

## 2021-09-11 DIAGNOSIS — N6452 Nipple discharge: Secondary | ICD-10-CM

## 2021-09-11 DIAGNOSIS — N644 Mastodynia: Secondary | ICD-10-CM

## 2021-09-11 DIAGNOSIS — Z1239 Encounter for other screening for malignant neoplasm of breast: Secondary | ICD-10-CM

## 2021-09-11 DIAGNOSIS — Z01419 Encounter for gynecological examination (general) (routine) without abnormal findings: Secondary | ICD-10-CM

## 2021-09-11 NOTE — Progress Notes (Signed)
[name] is a 46 y.o. female who presents to [facility] clinic today with complaint of left outer breast pain since 2017 that comes and goes. Patient rates pain at a 6 out of 10. Patient complained of left clear spontaneous nipple discharge since 2017. Patients last diagnostic mammogram completed 04/09/2016 was a bi-rads 4 intraductal mass that a left ultrasound guided core biopsy was recommended and surgical consultation that was not completed.   Pap Smear: Pap smear completed today. Last Pap smear was 11/08/2017 at the free cervical cancer screening clinic and was normal. Per patient has no history of an abnormal Pap smear. Last Pap smear result is available in Epic.   Physical exam: Breasts Breasts symmetrical. No skin abnormalities bilateral breasts. No nipple retraction bilateral breasts. No nipple discharge bilateral breasts. Unable to express nipple discharge on exam. No lymphadenopathy. No lumps palpated bilateral breasts. Complaints of left outer breast pain on exam.      Pelvic/Bimanual Ext Genitalia No lesions, no swelling and no discharge observed on external genitalia.        Vagina Vagina pink and normal texture. No lesions or discharge observed in vagina.        Cervix Cervix is present. Cervix pink and of normal texture. No discharge observed.    Uterus Uterus is present and palpable. Uterus in normal position and normal size.        Adnexae Bilateral ovaries present and palpable. No tenderness on palpation.         Rectovaginal No rectal exam completed today since patient had no rectal complaints. No skin abnormalities observed on exam.      Smoking History: Patient has never smoked.   Patient Navigation: Patient education provided. Access to services provided for patient through [place] program. Spanish interpreter Natale Lay from [facility] provided.    Breast and Cervical Cancer Risk Assessment: Patient does not have family history of breast cancer, known  genetic mutations, or radiation treatment to the chest before age 69. Patient does not have history of cervical dysplasia, immunocompromised, or DES exposure in-utero.  Risk Assessment     Risk Scores       09/11/2021   Last edited by: Narda Rutherford, LPN   5-year risk: 0.4 %   Lifetime risk: 5 %           A: B[facility]CP exam without pap smear Complaint of left outer breast pain and discharge.  P: Referred patient to the [facility] for a diagnostic mammogram due to no follow up was completed as recommended. Appointment scheduled Tuesday, September 30, 2021 at 0940.  Priscille Heidelberg, RN 09/11/2021 12:51 PM

## 2021-09-11 NOTE — Patient Instructions (Addendum)
Explained breast self awareness with Shelley Bradley. Pap smear completed today. Let her know [facility] will cover Pap smears and HPV typing every 5 years unless has a history of abnormal Pap smears. Referred patient to the [facility] for a diagnostic mammogram due to no follow up was completed as recommended. Appointment scheduled Tuesday, September 30, 2021 at 0940. Patient aware of appointment and will be there. Let patient know will follow up with her within the next couple weeks with results of Pap smear by phone. Shelley Bradley verbalized understanding.  Taran Haynesworth, Kathaleen Maser, RN 12:51 PM

## 2021-09-15 ENCOUNTER — Telehealth: Payer: Self-pay

## 2021-09-15 LAB — CYTOLOGY - PAP
Comment: NEGATIVE
Diagnosis: UNDETERMINED — AB
High risk HPV: NEGATIVE

## 2021-09-15 NOTE — Telephone Encounter (Signed)
Called patient via Shelley Bradley, [employer] to give pap smear results. Informed patient that pap smear showed ASCUS and HPV was negative. Based on this result her next pap smear will be due in 3 years. Patient voiced understanding.

## 2021-09-30 ENCOUNTER — Ambulatory Visit: Payer: Self-pay

## 2021-09-30 ENCOUNTER — Ambulatory Visit
Admission: RE | Admit: 2021-09-30 | Discharge: 2021-09-30 | Disposition: A | Discharge status: Home / Self Care | Payer: No Typology Code available for payment source | Source: Ambulatory Visit | Attending: Obstetrics and Gynecology | Admitting: Obstetrics and Gynecology

## 2021-09-30 ENCOUNTER — Ambulatory Visit
Admission: RE | Admit: 2021-09-30 | Discharge: 2021-09-30 | Disposition: A | Discharge status: Home / Self Care | Payer: Self-pay | Source: Ambulatory Visit | Attending: Obstetrics and Gynecology | Admitting: Obstetrics and Gynecology

## 2021-09-30 DIAGNOSIS — N6452 Nipple discharge: Secondary | ICD-10-CM

## 2021-11-03 ENCOUNTER — Other Ambulatory Visit: Payer: Self-pay | Admitting: Obstetrics and Gynecology

## 2021-11-03 DIAGNOSIS — N6452 Nipple discharge: Secondary | ICD-10-CM

## 2021-11-05 ENCOUNTER — Emergency Department (HOSPITAL_COMMUNITY): Payer: No Typology Code available for payment source

## 2021-11-05 ENCOUNTER — Other Ambulatory Visit: Payer: Self-pay

## 2021-11-05 ENCOUNTER — Emergency Department (HOSPITAL_COMMUNITY)
Admission: EM | Admit: 2021-11-05 | Discharge: 2021-11-06 | Disposition: A | Discharge status: Home / Self Care | Payer: No Typology Code available for payment source | Attending: Emergency Medicine | Admitting: Emergency Medicine

## 2021-11-05 ENCOUNTER — Encounter (HOSPITAL_COMMUNITY): Payer: Self-pay

## 2021-11-05 DIAGNOSIS — I10 Essential (primary) hypertension: Secondary | ICD-10-CM | POA: Insufficient documentation

## 2021-11-05 DIAGNOSIS — Z7984 Long term (current) use of oral hypoglycemic drugs: Secondary | ICD-10-CM | POA: Insufficient documentation

## 2021-11-05 DIAGNOSIS — K5900 Constipation, unspecified: Secondary | ICD-10-CM | POA: Insufficient documentation

## 2021-11-05 DIAGNOSIS — R1033 Periumbilical pain: Secondary | ICD-10-CM

## 2021-11-05 DIAGNOSIS — R112 Nausea with vomiting, unspecified: Secondary | ICD-10-CM | POA: Insufficient documentation

## 2021-11-05 DIAGNOSIS — E119 Type 2 diabetes mellitus without complications: Secondary | ICD-10-CM | POA: Insufficient documentation

## 2021-11-05 LAB — CBC WITH DIFFERENTIAL/PLATELET
Abs Immature Granulocytes: 0.03 10*3/uL (ref 0.00–0.07)
Basophils Absolute: 0 10*3/uL (ref 0.0–0.1)
Basophils Relative: 1 %
Eosinophils Absolute: 0.1 10*3/uL (ref 0.0–0.5)
Eosinophils Relative: 1 %
HCT: 37.9 % (ref 36.0–46.0)
Hemoglobin: 11.8 g/dL — ABNORMAL LOW (ref 12.0–15.0)
Immature Granulocytes: 0 %
Lymphocytes Relative: 42 %
Lymphs Abs: 3.5 10*3/uL (ref 0.7–4.0)
MCH: 27 pg (ref 26.0–34.0)
MCHC: 31.1 g/dL (ref 30.0–36.0)
MCV: 86.7 fL (ref 80.0–100.0)
Monocytes Absolute: 0.5 10*3/uL (ref 0.1–1.0)
Monocytes Relative: 6 %
Neutro Abs: 4.2 10*3/uL (ref 1.7–7.7)
Neutrophils Relative %: 50 %
Platelets: 323 10*3/uL (ref 150–400)
RBC: 4.37 MIL/uL (ref 3.87–5.11)
RDW: 14.6 % (ref 11.5–15.5)
WBC: 8.3 10*3/uL (ref 4.0–10.5)
nRBC: 0 % (ref 0.0–0.2)

## 2021-11-05 LAB — COMPREHENSIVE METABOLIC PANEL
ALT: 13 U/L (ref 0–44)
AST: 15 U/L (ref 15–41)
Albumin: 3.2 g/dL — ABNORMAL LOW (ref 3.5–5.0)
Alkaline Phosphatase: 64 U/L (ref 38–126)
Anion gap: 6 (ref 5–15)
BUN: 12 mg/dL (ref 6–20)
CO2: 28 mmol/L (ref 22–32)
Calcium: 9 mg/dL (ref 8.9–10.3)
Chloride: 103 mmol/L (ref 98–111)
Creatinine, Ser: 0.6 mg/dL (ref 0.44–1.00)
GFR, Estimated: >60 mL/min (ref >60)
Glucose, Bld: 105 mg/dL — ABNORMAL HIGH (ref 70–99)
Potassium: 4 mmol/L (ref 3.5–5.1)
Sodium: 137 mmol/L (ref 135–145)
Total Bilirubin: 0.4 mg/dL (ref 0.3–1.2)
Total Protein: 7.7 g/dL (ref 6.5–8.1)

## 2021-11-05 LAB — URINALYSIS, ROUTINE W REFLEX MICROSCOPIC
Bilirubin Urine: NEGATIVE
Glucose, UA: NEGATIVE mg/dL
Hgb urine dipstick: NEGATIVE
Ketones, ur: NEGATIVE mg/dL
Nitrite: NEGATIVE
Protein, ur: NEGATIVE mg/dL
Specific Gravity, Urine: 1.028 (ref 1.005–1.030)
pH: 5 (ref 5.0–8.0)

## 2021-11-05 LAB — LIPASE, BLOOD: Lipase: 35 U/L (ref 11–51)

## 2021-11-05 NOTE — ED Triage Notes (Signed)
Complaining of upper abdominal pain that started 3 days ago, nausea and vomiting.  ?

## 2021-11-05 NOTE — ED Provider Triage Note (Signed)
Emergency Medicine Provider Triage Evaluation Note ? ?Chapman Fitch , a 46 y.o. female  was evaluated in triage.  Pt complains of abd pain. ? ?Review of Systems  ?Positive: Upper abd pain, nausea, vomiting ?Negative: Fever, cp, sob, dysuria ? ?Physical Exam  ?BP (!) 139/94 (BP Location: Left Arm)   Pulse 93   Temp 98.1 ?F (36.7 ?C) (Oral)   Resp 18   Ht 5\' 2"  (1.575 m)   Wt (!) 145.2 kg   LMP  (LMP Unknown)   SpO2 99%   BMI 58.53 kg/m?  ?Gen:   Awake, no distress   ?Resp:  Normal effort  ?MSK:   Moves extremities without difficulty  ?Other:   ? ?Medical Decision Making  ?Medically screening exam initiated at 9:29 PM.  Appropriate orders placed.  [name] was informed that the remainder of the evaluation will be completed by another provider, this initial triage assessment does not replace that evaluation, and the importance of remaining in the ED until their evaluation is complete. ? ?Upper abd pain x 4 days, nausea and vomit, pain worse with eating ?  ?Shawnie Dapper, PA-C ?11/05/21 2133 ? ?

## 2021-11-06 LAB — I-STAT BETA HCG BLOOD, ED (MC, WL, AP ONLY): I-stat hCG, quantitative: <5 m[IU]/mL (ref <5)

## 2021-11-06 MED ORDER — POLYETHYLENE GLYCOL 3350 17 GM/SCOOP PO POWD
1.0000 | Freq: Once | ORAL | 0 refills | Status: AC
Start: 2021-11-06 — End: 2021-11-06

## 2021-11-06 MED ORDER — ONDANSETRON 4 MG PO TBDP: 4.0000 mg | ORAL_TABLET | Freq: Three times a day (TID) | ORAL | 0 refills | Status: DC | PRN

## 2021-11-06 NOTE — ED Provider Notes (Signed)
?[facility] EMERGENCY DEPARTMENT ?Provider Note ? ? ?CSN: [id number] ?Arrival date & time: 11/05/21  2001 ? ?  ? ?History ? ?Chief Complaint  ?Patient presents with  ? Abdominal Pain  ? ? ?Shelley Bradley is a 46 y.o. female. ? ?Patient with history of hypertension, diabetes, and morbid obesity presents today with complaints of abdominal pain.  She states that symptoms been ongoing for the past 4 days and has been constant without discernable trigger.  She also endorses a few episodes of nausea and NBNB vomiting when she eats large meals over the past 4 days. Pain is located in the periumbilical region, is cramping in nature, and does not radiate. She denies any fevers, chills, diarrhea, hematuria, dysuria, vaginal discharge, or vaginal pain. Does state that her last bowel movement was 2 days ago, was not bloody or melanous, however did require significant amount of straining to get out.  She denies any history of constipation in the past.  She has been attempting to manage her symptoms with Tylenol which she states helps for short period of time and then the pain returns.  She denies any history of abdominal surgeries in the past. Last menstrual cycle was over a year ago, states that she has had irregular periods her whole life and this is not unusual for her. ? ?The history is provided by the patient. A language interpreter was used.  ?Abdominal Pain ?Associated symptoms: constipation, nausea (resolved) and vomiting (resolved)   ?Associated symptoms: no chest pain, no chills, no cough, no diarrhea, no dysuria, no fever, no shortness of breath, no vaginal bleeding and no vaginal discharge   ? ?  ? ?Home Medications ?Prior to Admission medications   ?Medication Sig Start Date End Date Taking? Authorizing Provider  ?Blood Glucose Monitoring Suppl (TRUE METRIX AIR GLUCOSE METER) DEVI 1 each by Does not apply route 4 (four) times daily -  before meals and at bedtime. ?Patient not taking: Reported  on 05/20/2021 09/15/17   Massie Maroon, FNP  ?glucose blood (TRUE METRIX BLOOD GLUCOSE TEST) test strip Use as instructed ?Patient not taking: Reported on 05/20/2021 09/15/17   Massie Maroon, FNP  ?Lancets MISC 1 each by Does not apply route 4 (four) times daily -  before meals and at bedtime. ?Patient not taking: Reported on 05/20/2021 09/15/17   Massie Maroon, FNP  ?metFORMIN (GLUCOPHAGE XR) 500 MG 24 hr tablet Take 1 tablet (500 mg total) by mouth daily with breakfast. Increase to two tablets in 1 week. 05/26/21 05/26/22  Allayne Stack, DO  ?   ? ?Allergies    ?Patient has no known allergies.   ? ?Review of Systems   ?Review of Systems  ?Constitutional:  Negative for chills and fever.  ?Respiratory:  Negative for cough and shortness of breath.   ?Cardiovascular:  Negative for chest pain.  ?Gastrointestinal:  Positive for abdominal pain, constipation, nausea (resolved) and vomiting (resolved). Negative for abdominal distention, diarrhea and rectal pain.  ?Genitourinary:  Negative for decreased urine volume, difficulty urinating, dyspareunia, dysuria, pelvic pain, urgency, vaginal bleeding, vaginal discharge and vaginal pain.  ?Skin:  Negative for rash.  ?All other systems reviewed and are negative. ? ?Physical Exam ?Updated Vital Signs ?BP 127/70 (BP Location: Right Wrist)   Pulse 78   Temp 98.6 ?F (37 ?C) (Oral)   Resp 18   Ht 5\' 2"  (1.575 m)   Wt (!) 145.2 kg   LMP  (LMP Unknown)   SpO2 100%  BMI 58.53 kg/m?  ?Physical Exam ?Vitals and nursing note reviewed.  ?Constitutional:   ?   General: She is not in acute distress. ?   Appearance: Normal appearance. She is well-developed and normal weight. She is not ill-appearing, toxic-appearing or diaphoretic.  ?   Comments: Patient resting comfortably in bed in no acute distress  ?HENT:  ?   Head: Normocephalic and atraumatic.  ?Cardiovascular:  ?   Rate and Rhythm: Normal rate and regular rhythm.  ?   Heart sounds: Normal heart sounds.   ?Pulmonary:  ?   Effort: Pulmonary effort is normal. No respiratory distress.  ?   Breath sounds: Normal breath sounds. No stridor. No wheezing, rhonchi or rales.  ?Chest:  ?   Chest wall: No tenderness.  ?Abdominal:  ?   General: Abdomen is flat.  ?   Palpations: Abdomen is soft.  ?   Tenderness: There is abdominal tenderness in the periumbilical area. There is no right CVA tenderness, left CVA tenderness, guarding or rebound. Negative signs include Murphy's sign, Rovsing's sign, McBurney's sign, psoas sign and obturator sign.  ?   Hernia: No hernia is present.  ?Musculoskeletal:     ?   General: Normal range of motion.  ?   Cervical back: Normal range of motion.  ?Skin: ?   General: Skin is warm and dry.  ?Neurological:  ?   General: No focal deficit present.  ?   Mental Status: She is alert.  ?Psychiatric:     ?   Mood and Affect: Mood normal.     ?   Behavior: Behavior normal.  ? ? ?ED Results / Procedures / Treatments   ?Labs ?(all labs ordered are listed, but only abnormal results are displayed) ?Labs Reviewed  ?CBC WITH DIFFERENTIAL/PLATELET - Abnormal; Notable for the following components:  ?    Result Value  ? Hemoglobin 11.8 (*)   ? All other components within normal limits  ?COMPREHENSIVE METABOLIC PANEL - Abnormal; Notable for the following components:  ? Glucose, Bld 105 (*)   ? Albumin 3.2 (*)   ? All other components within normal limits  ?URINALYSIS, ROUTINE W REFLEX MICROSCOPIC - Abnormal; Notable for the following components:  ? APPearance HAZY (*)   ? Leukocytes,Ua MODERATE (*)   ? Bacteria, UA RARE (*)   ? All other components within normal limits  ?LIPASE, BLOOD  ?I-STAT BETA HCG BLOOD, ED ([facility], [facility], [facility] ONLY)  ? ? ?EKG ?None ? ?Radiology ?US Abdomen Limited ? ?Result Date: 11/05/2021 ?CLINICAL DATA:  Upper abdominal pain. EXAM: ULTRASOUND ABDOMEN LIMITED RIGHT UPPER QUADRANT COMPARISON:  Ultrasound 12/29/2013 FINDINGS: Gallbladder: Physiologically distended. No gallstones or wall thickening  visualized. No sonographic Murphy sign noted by sonographer. Common bile duct: Diameter: 4 mm. Liver: Diffusely increased hepatic parenchymal echogenicity. The liver parenchyma is difficult to penetrate no evidence of focal lesion. No capsular nodularity. Portal vein is patent on color Doppler imaging with normal direction of blood flow towards the liver. Other: No right upper quadrant ascites. IMPRESSION: 1. Moderate hepatic steatosis. 2. Normal sonographic appearance of the gallbladder and biliary tree. Electronically Signed   By: Narda Rutherford M.D.   On: 11/05/2021 22:25   ? ?Procedures ?Procedures  ? ? ?Medications Ordered in ED ?Medications - No data to display ? ?ED Course/ Medical Decision Making/ A&P ?  ?                        ?Medical Decision  Making ? ?This patient presents to the ED for concern of abdominal pain, this involves an extensive number of treatment options, and is a complaint that carries with it a high risk of complications and morbidity.  ? ? ?Co morbidities that complicate the patient evaluation ? ?Htn, diabetes, morbid obesity ? ? ?Lab Tests: ? ?I Ordered, and personally interpreted labs.  The pertinent results include:  no leukocytosis or anemia, no electrolyte abnormalities, no other acute laboratory findings ? ? ?Imaging Studies ordered: ? ?I ordered imaging studies including RUQ US  ?I independently visualized and interpreted imaging which showed  ?1. Moderate hepatic steatosis. ?2. Normal sonographic appearance of the gallbladder and biliary tree. ?I agree with the radiologist interpretation ? ? ?Test / Admission - Considered: ? ?Offered patient CT imaging of her abdomen which she declined as she stated she had been here too long already and just wants to go home ? ?Patient presents today with 4 days of periumbilical abdominal pain. Patient is nontoxic, nonseptic appearing, in no apparent distress.  Upon my evaluation approximately 20 hours after the patient initially arrived to  the ER, she states that she is not currently in pain or nauseated.  She does have some mild periumbilical tenderness to deep palpation, offered antiemetics, fluids, and further imaging to further evaluate same which patient dec

## 2021-11-06 NOTE — Discharge Instructions (Signed)
Es probable que su dolor abdominal de hoy se deba al estre?imiento. Recomiendo hacer una limpieza intestinal Miralax para el alivio que incluye 8 tapas llenas de Miralax en Gatorade. Beba esto en 1 hora en un d?a en el que no tenga planes y espere movimientos intestinales significativos durante todo el d?a que terminen en heces l?quidas. Puede continuar con 1 tap?n lleno de Miralax dos veces al d?a si contin?an las dificultades para defecar. Regresar si se desarrollan s?ntomas nuevos o que empeoran ? ?

## 2021-11-18 ENCOUNTER — Emergency Department (HOSPITAL_COMMUNITY): Payer: No Typology Code available for payment source

## 2021-11-18 ENCOUNTER — Other Ambulatory Visit: Payer: Self-pay

## 2021-11-18 ENCOUNTER — Emergency Department (HOSPITAL_COMMUNITY)
Admission: EM | Admit: 2021-11-18 | Discharge: 2021-11-19 | Disposition: A | Discharge status: Home / Self Care | Payer: No Typology Code available for payment source | Attending: Emergency Medicine | Admitting: Emergency Medicine

## 2021-11-18 ENCOUNTER — Encounter (HOSPITAL_COMMUNITY): Payer: Self-pay | Admitting: Emergency Medicine

## 2021-11-18 DIAGNOSIS — H5711 Ocular pain, right eye: Secondary | ICD-10-CM | POA: Insufficient documentation

## 2021-11-18 DIAGNOSIS — R519 Headache, unspecified: Secondary | ICD-10-CM

## 2021-11-18 LAB — CBC WITH DIFFERENTIAL/PLATELET
Abs Immature Granulocytes: 0.01 10*3/uL (ref 0.00–0.07)
Basophils Absolute: 0 10*3/uL (ref 0.0–0.1)
Basophils Relative: 0 %
Eosinophils Absolute: 0.1 10*3/uL (ref 0.0–0.5)
Eosinophils Relative: 2 %
HCT: 39.5 % (ref 36.0–46.0)
Hemoglobin: 12.6 g/dL (ref 12.0–15.0)
Immature Granulocytes: 0 %
Lymphocytes Relative: 46 %
Lymphs Abs: 2.8 10*3/uL (ref 0.7–4.0)
MCH: 27.4 pg (ref 26.0–34.0)
MCHC: 31.9 g/dL (ref 30.0–36.0)
MCV: 85.9 fL (ref 80.0–100.0)
Monocytes Absolute: 0.3 10*3/uL (ref 0.1–1.0)
Monocytes Relative: 5 %
Neutro Abs: 2.9 10*3/uL (ref 1.7–7.7)
Neutrophils Relative %: 47 %
Platelets: 286 10*3/uL (ref 150–400)
RBC: 4.6 MIL/uL (ref 3.87–5.11)
RDW: 14.2 % (ref 11.5–15.5)
WBC: 6.2 10*3/uL (ref 4.0–10.5)
nRBC: 0 % (ref 0.0–0.2)

## 2021-11-18 LAB — COMPREHENSIVE METABOLIC PANEL
ALT: 13 U/L (ref 0–44)
AST: 15 U/L (ref 15–41)
Albumin: 3.1 g/dL — ABNORMAL LOW (ref 3.5–5.0)
Alkaline Phosphatase: 66 U/L (ref 38–126)
Anion gap: 6 (ref 5–15)
BUN: 11 mg/dL (ref 6–20)
CO2: 29 mmol/L (ref 22–32)
Calcium: 8.8 mg/dL — ABNORMAL LOW (ref 8.9–10.3)
Chloride: 102 mmol/L (ref 98–111)
Creatinine, Ser: 0.62 mg/dL (ref 0.44–1.00)
GFR, Estimated: >60 mL/min (ref >60)
Glucose, Bld: 98 mg/dL (ref 70–99)
Potassium: 4.5 mmol/L (ref 3.5–5.1)
Sodium: 137 mmol/L (ref 135–145)
Total Bilirubin: 0.4 mg/dL (ref 0.3–1.2)
Total Protein: 7.1 g/dL (ref 6.5–8.1)

## 2021-11-18 LAB — I-STAT BETA HCG BLOOD, ED (MC, WL, AP ONLY): I-stat hCG, quantitative: <5 m[IU]/mL (ref <5)

## 2021-11-18 MED ORDER — LORAZEPAM 2 MG/ML IJ SOLN
0.5000 mg | Freq: Once | INTRAMUSCULAR | Status: AC
  Administered 2021-11-19: 0.5 mg via INTRAVENOUS
  Filled 2021-11-18: qty 1

## 2021-11-18 MED ORDER — LORAZEPAM 2 MG/ML IJ SOLN
1.0000 mg | Freq: Once | INTRAMUSCULAR | Status: AC
  Administered 2021-11-18: 1 mg via INTRAVENOUS
  Filled 2021-11-18: qty 1

## 2021-11-18 MED ORDER — SODIUM CHLORIDE 0.9 % IV BOLUS
1000.0000 mL | Freq: Once | INTRAVENOUS | Status: AC
  Administered 2021-11-18: 1000 mL via INTRAVENOUS

## 2021-11-18 MED ORDER — FLUORESCEIN SODIUM 1 MG OP STRP
1.0000 | ORAL_STRIP | Freq: Once | OPHTHALMIC | Status: AC
  Administered 2021-11-18: 1 via OPHTHALMIC
  Filled 2021-11-18: qty 1

## 2021-11-18 MED ORDER — METOCLOPRAMIDE HCL 5 MG/ML IJ SOLN
10.0000 mg | Freq: Once | INTRAMUSCULAR | Status: AC
Start: 2021-11-18 — End: 2021-11-18
  Administered 2021-11-18: 10 mg via INTRAVENOUS
  Filled 2021-11-18: qty 2

## 2021-11-18 MED ORDER — IOHEXOL 300 MG/ML  SOLN
75.0000 mL | Freq: Once | INTRAMUSCULAR | Status: AC | PRN
  Administered 2021-11-18: 75 mL via INTRAVENOUS

## 2021-11-18 MED ORDER — IOHEXOL 300 MG/ML  SOLN: 100.0000 mL | Freq: Once | INTRAMUSCULAR | Status: DC | PRN

## 2021-11-18 MED ORDER — TETRACAINE HCL 0.5 % OP SOLN
2.0000 [drp] | Freq: Once | OPHTHALMIC | Status: AC
  Administered 2021-11-18: 2 [drp] via OPHTHALMIC
  Filled 2021-11-18: qty 4

## 2021-11-18 MED ORDER — IOHEXOL 350 MG/ML SOLN: 75.0000 mL | Freq: Once | INTRAVENOUS | Status: DC | PRN

## 2021-11-18 MED ORDER — ACETAMINOPHEN 500 MG PO TABS
1000.0000 mg | ORAL_TABLET | Freq: Once | ORAL | Status: AC
  Administered 2021-11-18: 1000 mg via ORAL
  Filled 2021-11-18: qty 2

## 2021-11-18 MED ORDER — DIPHENHYDRAMINE HCL 50 MG/ML IJ SOLN
25.0000 mg | Freq: Once | INTRAMUSCULAR | Status: AC
Start: 2021-11-18 — End: 2021-11-18
  Administered 2021-11-18: 25 mg via INTRAVENOUS
  Filled 2021-11-18: qty 1

## 2021-11-18 MED ORDER — MORPHINE SULFATE (PF) 4 MG/ML IV SOLN
4.0000 mg | Freq: Once | INTRAVENOUS | Status: AC
  Administered 2021-11-18: 4 mg via INTRAVENOUS
  Filled 2021-11-18: qty 1

## 2021-11-18 NOTE — ED Provider Notes (Signed)
Care assumed from [facility], [place], at shift change, please see their notes for full documentation of patient's complaint/HPI. Briefly, pt here with complaint of R sided facial pain from R eye into neck area since Saturday. Pt apparently unable/unwilling to open eye during exam with previous provider s/2 pain? Results so far show normal WBC count. Awaiting CT's for further eval. Initially CT head and CT soft tissue neck with contrast ordered. Previous provider concerns for possibility of orbital vs preorbital cellulitis. Plan is to change CT to CT orbits with contrast and keep CT soft tissue neck. No complaints of headache initially so CT head cancelled.  ? ?Physical Exam  ?BP 126/68 (BP Location: Left Arm)   Pulse 70   Temp 98.3 ?F (36.8 ?C) (Oral)   Resp 18   Ht 5\' 2"  (1.575 m)   Wt (!) 145.2 kg   LMP 11/13/2021   SpO2 100%   BMI 58.53 kg/m?  ? ?Physical Exam ?Vitals and nursing note reviewed.  ?Constitutional:   ?   Appearance: She is not ill-appearing.  ?HENT:  ?   Head: Normocephalic and atraumatic.  ?Eyes:  ?   Intraocular pressure: Right eye pressure is 16 mmHg.  ?   Extraocular Movements: Extraocular movements intact.  ?   Right eye: Normal extraocular motion and no nystagmus.  ?   Conjunctiva/sclera: Conjunctivae normal.  ?   Right eye: Right conjunctiva is not injected.  ?   Pupils: Pupils are equal, round, and reactive to light.  ?   Right eye: No fluorescein uptake.  ?   Slit lamp exam: ?   Right eye: No corneal flare.  ?   Comments: Holding R eye closed at this time. Spontaneously during exam patient able to open R eye. EOMI.   ?Cardiovascular:  ?   Rate and Rhythm: Normal rate and regular rhythm.  ?Pulmonary:  ?   Effort: Pulmonary effort is normal.  ?   Breath sounds: Normal breath sounds.  ?Skin: ?   General: Skin is warm and dry.  ?   Coloration: Skin is not jaundiced.  ?Neurological:  ?   Mental Status: She is alert.  ?   Comments: Alert and oriented to self, place, time and event.   ? ?Speech is fluent, clear without dysarthria or dysphasia.  ? ?Strength 5/5 in upper/lower extremities   ? ?No pronator drift.  ?Normal finger-to-nose and feet tapping.  ?CN I not tested  ?CN II grossly intact visual fields bilaterally. Did not visualize posterior eye.  ?CN III, IV, VI PERRLA and EOMs intact bilaterally  ?Subjective decreased sensation to R face along V1, V2, and V3 nerves ?CN VII facial movements symmetric  ?CN VIII not tested  ?CN IX, X no uvula deviation, symmetric rise of soft palate  ?CN XI 5/5 SCM and trapezius strength bilaterally  ?CN XII Midline tongue protrusion, symmetric L/R movements   ? ? ?Procedures  ?Procedures ? ?ED Course / MDM  ?  ?Medical Decision Making ?CT orbits and neck without acute findings.  ? ?On repeat evaluation pt initially complaining of R facial pain. With interpretor she now tells me her face feels numb on the right side. It is difficult to distinguish pain vs numbness. Interpreter services use - she tells me she has both pain and numbness since Saturday. Remainder of neuro exam reassuring. At first she holds her R eye closed and tells me she cannot physically open her eye. Spontaneously during examination pt's right eye opens. She seems  surprised by this. Unfortunately pt did not have CT head done. Her CT scan was significantly delayed as well due to being returned after having claustrophobia. Will consult neurology at this time for further eval. Question possibility of MS vs complex migraine? Pt will likely need MRI. Will also plan for further eval with proper eye examination.  ? ?Curbside consult with Neurologist Dr. Lorrin Goodell regarding MRI brain +/- MRI C spine. Given numbness does appear to be more the lower portion of the face feels it is reasonable to add on MRI C spine at this time to assess for MS lesion in upper portion of C spine.  ? ?Eye exam unremarkable. Normal pressure. No concern for glaucoma. No corneal abrasion. Will proceed with MRIs at this  time. Will provide headache cocktail due to pain.  ? ?Amount and/or Complexity of Data Reviewed ?Radiology: ordered. ? ?Risk ?OTC drugs. ?Prescription drug management. ? ? ?AT shift change case signed out to Wyn Quaker, [place], who will dispo patient accordingly.  ? ? ? ? ?  ?Eustaquio Maize, PA-C ?11/18/21 2351 ? ?  ?Malvin Johns, MD ?11/21/21 0831 ? ?

## 2021-11-18 NOTE — ED Notes (Signed)
Waiting to give ativan until CT returns to take patient  ?

## 2021-11-18 NOTE — ED Triage Notes (Signed)
Patient c/o right sided facial swelling, unable to open right eye, headache and clogged feeling to right ear.  ?

## 2021-11-18 NOTE — ED Provider Triage Note (Signed)
Emergency Medicine Provider Triage Evaluation Note ? ?Shelley Bradley , a 46 y.o. female  was evaluated in triage.  Pt complains of Right sided facial pain/swelling. Pain began two days ago. Pain is described as sharp. She has noticed no rash. Denies trauma. She has tried ibuprofen with minimal to no relief. Denies SOB, CP, N/V/D, fever, chills, night sweats, abdominal pain, difficulty swallowing liquids or food, difficulty breathing, sore throat. . ? ?Review of Systems  ?Positive: R sided facial pain, ear pain, eye swelling ?Negative: Changes in vision/hearing, SOB, CP, N/V/D, fever, chills, night sweats, abdominal pain, difficulty swallowing liquids or food, difficulty breathing, sore throat ? ?Physical Exam  ?BP (!) 135/91 (BP Location: Left Arm)   Pulse 80   Temp 98.3 ?F (36.8 ?C) (Oral)   Resp 16   Ht 5\' 2"  (1.575 m)   Wt (!) 145.2 kg   LMP 11/13/2021   SpO2 98%   BMI 58.53 kg/m?  ?Gen:   Awake, no distress   ?Resp:  Normal effort  ?MSK:   Moves extremities without difficulty  ?Other:  Difficult to tell degree of swelling with patient's adipose distribution. Pain with palpation of right face, mastoid, eye lids, R neck.  ?  Neck is supple with no pain exacerbated with flexion/extension ?  No tenderness with palpation of teeth.  ?  No observable overlying skin abnormalities ?  No pain with EOMs ?   ? ?Medical Decision Making  ?Medically screening exam initiated at 11:25 AM.  Appropriate orders placed.  11/15/2021 Shelley Bradley was informed that the remainder of the evaluation will be completed by another provider, this initial triage assessment does not replace that evaluation, and the importance of remaining in the ED until their evaluation is complete. ? ? ?  ?Shelley Bradley, Shelley Bradley ?11/18/21 1131 ? ?

## 2021-11-18 NOTE — ED Provider Notes (Signed)
I assumed care of patient at shift change from previous team, please see their note for full H&P. ?Briefly patient is here for pain and numbness on the right side of her face and eye. ?At the time of shift change plan is to follow-up on MRI C-spine and brain, if normal anticipate discharge home. ? ?Plan to follow up on MRI C spine and Brain if normal can discharge home.  ? ?MRI C-spine and MRI brain results are reviewed.  The nonspecific generalized decreased T1 bone marrow signal is most likely attributed to patient's elevated BMI of 58.5 as she is 5 2 and weighs 145 kg. ? ?Despite additional Ativan being ordered patient poorly tolerated the noncontrasted scans and therefore contrasted scans were unable to be obtained, however given the patient reports her symptoms have improved while in the emergency room and she appears neurovascularly intact on my exam in no distress I do not think she needs to be further kept in the emergency room for a repeat attempt at the scans. ? ?Using professional Spanish-speaking medical interpreter I discussed results with patient and her support person at bedside. ?Patient's speech is not slurred.  Her facial movements are symmetric.  She has normal coordination, spontaneously turns from laying prone to laying on her back/side without difficulties and exhibits normal head range of motion. ? ?Recommended OTC meds as needed for pain.  Outpatient follow-up. ? ?Return precautions were discussed with patient who states their understanding.  At the time of discharge patient denied any unaddressed complaints or concerns.  Patient is agreeable for discharge home. ? ?Note: Portions of this report may have been transcribed using voice recognition software. Every effort was made to ensure accuracy; however, inadvertent computerized transcription errors may be present ? ? ? ? ? ?  ?Lorin Glass, [place] ?11/19/21 0531 ? ?  ?Ripley Fraise, MD ?11/19/21 [phone] ? ?

## 2021-11-18 NOTE — ED Provider Notes (Addendum)
?[facility] ?Provider Note ? ? ?CSN: [id number] ?Arrival date & time: 11/18/21  1035 ? ?  ? ?History ? ?Chief Complaint  ?Patient presents with  ? Facial Swelling  ? ? ?Shelley Bradley is a 46 y.o. female who presents to the ED for evaluation of right-sided facial pain and swelling that started about 2 days ago.  Patient describes the pain as sharp, encompassing her entire right eye.  Pain aggravated with lights, ocular movement.  Additionally, she is having pain just under the tragus of her right ear extending down her jaw, making it difficult to talk or swallow.  She has taken Motrin without any relief.  No recent illness.  She denies fever, chills, chest pain, shortness of breath, abdominal pain, nausea, vomiting, diarrhea and sore throat. ? ?Spanish interpreter used for this encounter. ?HPI ? ?  ? ?Home Medications ?Prior to Admission medications   ?Medication Sig Start Date End Date Taking? Authorizing Provider  ?Blood Glucose Monitoring Suppl (TRUE METRIX AIR GLUCOSE METER) DEVI 1 each by Does not apply route 4 (four) times daily -  before meals and at bedtime. ?Patient not taking: Reported on 05/20/2021 09/15/17   Massie MaroonHollis, Lachina M, FNP  ?glucose blood (TRUE METRIX BLOOD GLUCOSE TEST) test strip Use as instructed ?Patient not taking: Reported on 05/20/2021 09/15/17   Massie MaroonHollis, Lachina M, FNP  ?Lancets MISC 1 each by Does not apply route 4 (four) times daily -  before meals and at bedtime. ?Patient not taking: Reported on 05/20/2021 09/15/17   Massie MaroonHollis, Lachina M, FNP  ?metFORMIN (GLUCOPHAGE XR) 500 MG 24 hr tablet Take 1 tablet (500 mg total) by mouth daily with breakfast. Increase to two tablets in 1 week. 05/26/21 05/26/22  Allayne StackBeard, Samantha N, DO  ?ondansetron (ZOFRAN-ODT) 4 MG disintegrating tablet Take 1 tablet (4 mg total) by mouth every 8 (eight) hours as needed for nausea or vomiting. 11/06/21   Smoot, Shawn RouteSarah A, PA-C  ?   ? ?Allergies    ?Patient has no known allergies.    ? ?Review of Systems   ?Review of Systems ? ?Physical Exam ?Updated Vital Signs ?BP 123/72 (BP Location: Left Arm)   Pulse 62   Temp 98.3 ?F (36.8 ?C) (Oral)   Resp 17   Ht 5\' 2"  (1.575 m)   Wt (!) 145.2 kg   LMP 11/13/2021   SpO2 100%   BMI 58.53 kg/m?  ?Physical Exam ?Vitals and nursing note reviewed.  ?Constitutional:   ?   General: She is not in acute distress. ?   Appearance: She is obese. She is not ill-appearing.  ?   Comments: Patient with right eye closed, unwilling to open due to pain.  Talking through clenched teeth this pain limits her ability to open and speak normally  ?HENT:  ?   Head: Atraumatic.  ? ?   Comments: Tenderness from right periauricular down to her right jaw near her lips.  No obvious erythema, swelling, bruising. ?   Right Ear: Tympanic membrane normal.  ?   Left Ear: Tympanic membrane normal.  ?   Mouth/Throat:  ?   Comments: Teeth without fractures, abscess or infection ?Eyes:  ?   General: Lids are normal.  ?   Conjunctiva/sclera: Conjunctivae normal.  ?   Comments: Patient significantly uncomfortable while opening her right eye.  Pain with EOM.  Sclera otherwise noninjected nonswollen and without any discharge. ? ?Left eye without acute findings  ?Neck:  ?   Comments:  Nonpalpable lymph nodes, nontender to palpation of the cervical soft tissues ?Cardiovascular:  ?   Rate and Rhythm: Normal rate and regular rhythm.  ?   Pulses: Normal pulses.  ?   Heart sounds: No murmur heard. ?Pulmonary:  ?   Effort: Pulmonary effort is normal. No respiratory distress.  ?   Breath sounds: Normal breath sounds.  ?Abdominal:  ?   General: There is no distension.  ?   Palpations: Abdomen is soft.  ?   Tenderness: There is no abdominal tenderness.  ?Musculoskeletal:     ?   General: Normal range of motion.  ?   Cervical back: Normal range of motion.  ?Skin: ?   General: Skin is warm and dry.  ?   Capillary Refill: Capillary refill takes less than 2 seconds.  ?Neurological:  ?   General: No  focal deficit present.  ?   Mental Status: She is alert.  ?Psychiatric:     ?   Mood and Affect: Mood normal.  ? ? ?ED Results / Procedures / Treatments   ?Labs ?(all labs ordered are listed, but only abnormal results are displayed) ?Labs Reviewed  ?CBC WITH DIFFERENTIAL/PLATELET  ?COMPREHENSIVE METABOLIC PANEL  ?I-STAT BETA HCG BLOOD, ED ([facility], [facility], [facility] ONLY)  ? ? ?EKG ?None ? ?Radiology ?No results found. ? ?Procedures ?Procedures  ? ? ?Medications Ordered in ED ?Medications  ?morphine (PF) 4 MG/ML injection 4 mg (4 mg Intravenous Given 11/18/21 1432)  ? ? ?ED Course/ Medical Decision Making/ A&P ?Clinical Course as of 11/19/21 1535  ?Wed Nov 19, 2021  ?0012 Patient is currently sleeping comfortably in no distress. [EH]  ?  ?Clinical Course User Index ?[EH] Cristina Gong, PA-C  ? ?                        ?Medical Decision Making ?Amount and/or Complexity of Data Reviewed ?Radiology: ordered. ? ?Risk ?OTC drugs. ?Prescription drug management. ? ? ?History:  ?Per HPI ?Social determinants of health:  ?Social History  ? ?Socioeconomic History  ? Marital status: Married  ?  Spouse name: Not on file  ? Number of children: 4  ? Years of education: Not on file  ? Highest education level: 12th grade  ?Occupational History  ? Not on file  ?Tobacco Use  ? Smoking status: Never  ? Smokeless tobacco: Never  ?Vaping Use  ? Vaping Use: Never used  ?Substance and Sexual Activity  ? Alcohol use: No  ? Drug use: No  ? Sexual activity: Yes  ?  Birth control/protection: None  ?Other Topics Concern  ? Not on file  ?Social History Narrative  ? Not on file  ? ?Social Determinants of Health  ? ?Financial Resource Strain: Not on file  ?Food Insecurity: No Food Insecurity  ? Worried About Programme researcher, broadcasting/film/video in the Last Year: Never true  ? Ran Out of Food in the Last Year: Never true  ?Transportation Needs: No Transportation Needs  ? Lack of Transportation (Medical): No  ? Lack of Transportation (Non-Medical): No  ?Physical Activity: Not  on file  ?Stress: Not on file  ?Social Connections: Not on file  ?Intimate Partner Violence: Not on file  ? ? ? ?Initial impression: ? ?This patient presents to the ED for concern of eye and jaw pain, this involves an extensive number of treatment options, and is a complaint that carries with it a high risk of complications and morbidity.  The emergent differential diagnosis for acute eye pain includes, but is not limited to ocular ischemia, optic neuritis, temporal arteritis, Sinusitis, neuralgia,  Acute angle closure glaucoma, eye trauma, ? ?Lab Tests and EKG: ? ?I Ordered, reviewed, and interpreted labs and EKG.  The pertinent results include:  ?CBC without leukocytosis or anemia ?Normal pregnancy ?CMP normal ? ? ?Imaging Studies ordered: ? ?I ordered imaging studies including  ?Pending CT head, orbits and soft tissue neck ? ? ? ?Medicines ordered and prescription drug management: ? ?I ordered medication including: ?Morphine 4 mg IV ?Reevaluation of the patient after these medicines showed that the patient improved ?I have reviewed the patients home medicines and have made adjustments as needed ? ? ? ?ED Course: ?46 year old female, nontoxic-appearing presents to the ED for evaluation of right eye and jaw pain.  Vitals without significant abnormality.  Physical exam significant for tenderness of the right eye and periorbital region, and right periauricular region down to the jaw.  Patient speaking through clenched teeth due to pain in her jaw.  Given patient's body habitus, difficult to ascertain presence or degree of swelling in these areas.  There is no erythema or fluctuance.  Right eye without scleral injection or foreign body.  Labs without acute findings.  Given her discomfort, I ordered CT of the head and soft tissues of the neck to determine presence of infection.  Patient given 4 mg morphine for pain.  ?Care handed off to [place] PA at shift change. Plan is pending CMP and CT imaging. If concerning,  consult with appropriate service. If normal, discharge home, possibly with steroids and pain meds. Plan is subject to change at discretion of accepting provider. ? ? ?Final Clinical Impression(s) / ED Diagnoses ?Fi

## 2021-11-19 ENCOUNTER — Emergency Department (HOSPITAL_COMMUNITY): Payer: No Typology Code available for payment source

## 2021-11-19 MED ORDER — LORAZEPAM 2 MG/ML IJ SOLN: 1.0000 mg | Freq: Once | INTRAMUSCULAR | Status: DC

## 2021-11-19 NOTE — Discharge Instructions (Addendum)
Please take Ibuprofen (Advil, motrin) and Tylenol (acetaminophen) to relieve your pain.   ° °You may take up to 600 MG (3 pills) of normal strength ibuprofen every 8 hours as needed.   °You make take tylenol, up to 1,000 mg (two extra strength pills) every 8 hours as needed.  ° °It is safe to take ibuprofen and tylenol at the same time as they work differently.  ° Do not take more than 3,000 mg tylenol in a 24 hour period (not more than one dose every 8 hours.  Please check all medication labels as many medications such as pain and cold medications may contain tylenol.  Do not drink alcohol while taking these medications.  Do not take other NSAID'S while taking ibuprofen (such as aleve or naproxen).  Please take ibuprofen with food to decrease stomach upset. ° °Today you received medications that may make you sleepy or impair your ability to make decisions.  For the next 24 hours please do not drive, operate heavy machinery, care for a small child with out another adult present, or perform any activities that may cause harm to you or someone else if you were to fall asleep or be impaired.  ° °

## 2021-11-19 NOTE — ED Notes (Signed)
Patient transported to MRI 

## 2021-11-19 NOTE — ED Notes (Signed)
Patient returned from MRI.

## 2022-01-21 ENCOUNTER — Other Ambulatory Visit: Payer: No Typology Code available for payment source

## 2023-01-21 ENCOUNTER — Other Ambulatory Visit: Payer: Self-pay

## 2023-01-21 ENCOUNTER — Emergency Department (HOSPITAL_COMMUNITY)
Admission: EM | Admit: 2023-01-21 | Discharge: 2023-01-22 | Disposition: A | Discharge status: Home / Self Care | Payer: No Typology Code available for payment source | Attending: Emergency Medicine | Admitting: Emergency Medicine

## 2023-01-21 DIAGNOSIS — Z7984 Long term (current) use of oral hypoglycemic drugs: Secondary | ICD-10-CM | POA: Insufficient documentation

## 2023-01-21 DIAGNOSIS — E119 Type 2 diabetes mellitus without complications: Secondary | ICD-10-CM | POA: Insufficient documentation

## 2023-01-21 DIAGNOSIS — J020 Streptococcal pharyngitis: Secondary | ICD-10-CM | POA: Insufficient documentation

## 2023-01-21 LAB — CBC WITH DIFFERENTIAL/PLATELET
Abs Immature Granulocytes: 0.04 10*3/uL (ref 0.00–0.07)
Basophils Absolute: 0 10*3/uL (ref 0.0–0.1)
Basophils Relative: 0 %
Eosinophils Absolute: 0.1 10*3/uL (ref 0.0–0.5)
Eosinophils Relative: 1 %
HCT: 40.6 % (ref 36.0–46.0)
Hemoglobin: 12.9 g/dL (ref 12.0–15.0)
Immature Granulocytes: 1 %
Lymphocytes Relative: 33 %
Lymphs Abs: 2.9 10*3/uL (ref 0.7–4.0)
MCH: 27.1 pg (ref 26.0–34.0)
MCHC: 31.8 g/dL (ref 30.0–36.0)
MCV: 85.3 fL (ref 80.0–100.0)
Monocytes Absolute: 0.4 10*3/uL (ref 0.1–1.0)
Monocytes Relative: 5 %
Neutro Abs: 5.4 10*3/uL (ref 1.7–7.7)
Neutrophils Relative %: 60 %
Platelets: 273 10*3/uL (ref 150–400)
RBC: 4.76 MIL/uL (ref 3.87–5.11)
RDW: 14.1 % (ref 11.5–15.5)
WBC: 8.8 10*3/uL (ref 4.0–10.5)
nRBC: 0 % (ref 0.0–0.2)

## 2023-01-21 LAB — COMPREHENSIVE METABOLIC PANEL
ALT: 33 U/L (ref 0–44)
AST: 39 U/L (ref 15–41)
Albumin: 3.3 g/dL — ABNORMAL LOW (ref 3.5–5.0)
Alkaline Phosphatase: 73 U/L (ref 38–126)
Anion gap: 15 (ref 5–15)
BUN: 6 mg/dL (ref 6–20)
CO2: 23 mmol/L (ref 22–32)
Calcium: 9 mg/dL (ref 8.9–10.3)
Chloride: 94 mmol/L — ABNORMAL LOW (ref 98–111)
Creatinine, Ser: 0.62 mg/dL (ref 0.44–1.00)
GFR, Estimated: >60 mL/min (ref >60)
Glucose, Bld: 415 mg/dL — ABNORMAL HIGH (ref 70–99)
Potassium: 4 mmol/L (ref 3.5–5.1)
Sodium: 132 mmol/L — ABNORMAL LOW (ref 135–145)
Total Bilirubin: 0.3 mg/dL (ref 0.3–1.2)
Total Protein: 7.6 g/dL (ref 6.5–8.1)

## 2023-01-21 LAB — I-STAT CHEM 8, ED
BUN: 8 mg/dL (ref 6–20)
Calcium, Ion: 1.1 mmol/L — ABNORMAL LOW (ref 1.15–1.40)
Chloride: 98 mmol/L (ref 98–111)
Creatinine, Ser: 0.5 mg/dL (ref 0.44–1.00)
Glucose, Bld: 431 mg/dL — ABNORMAL HIGH (ref 70–99)
HCT: 41 % (ref 36.0–46.0)
Hemoglobin: 13.9 g/dL (ref 12.0–15.0)
Potassium: 4 mmol/L (ref 3.5–5.1)
Sodium: 133 mmol/L — ABNORMAL LOW (ref 135–145)
TCO2: 25 mmol/L (ref 22–32)

## 2023-01-21 MED ORDER — DEXAMETHASONE SODIUM PHOSPHATE 10 MG/ML IJ SOLN
10.0000 mg | Freq: Once | INTRAMUSCULAR | Status: AC
  Administered 2023-01-21: 10 mg via INTRAMUSCULAR
  Filled 2023-01-21: qty 1

## 2023-01-21 NOTE — ED Notes (Signed)
Pt had a near syncopal episode when ambulating out of triage room. Assisted to a wheelchair

## 2023-01-21 NOTE — ED Provider Notes (Addendum)
EMERGENCY DEPARTMENT AT [facility] Provider Note   CSN: [id number] Arrival date & time: 01/21/23  2124     History  Chief Complaint  Patient presents with   Facial Pain   Facial Swelling    Shelley Bradley is a 47 y.o. female. With past medical history of obesity, type 2 diabetes who presents to the emergency department with facial swelling and pain  States that symptoms began suddenly at around 8:30PM. She describes having an onset of right ear pain that progressed to right facial pain and right upper lip swelling. She then developed change in voice and presents here. She denies any recent illnesses. She denies having sore throat or fever at home. She denies feeling short of breath.   Professional interpreter used during encounter.   HPI     Home Medications Prior to Admission medications   Medication Sig Start Date End Date Taking? Authorizing Provider  Blood Glucose Monitoring Suppl (TRUE METRIX AIR GLUCOSE METER) DEVI 1 each by Does not apply route 4 (four) times daily -  before meals and at bedtime. Patient not taking: Reported on 05/20/2021 09/15/17   Massie Maroon, FNP  glucose blood (TRUE METRIX BLOOD GLUCOSE TEST) test strip Use as instructed Patient not taking: Reported on 05/20/2021 09/15/17   Massie Maroon, FNP  Lancets MISC 1 each by Does not apply route 4 (four) times daily -  before meals and at bedtime. Patient not taking: Reported on 05/20/2021 09/15/17   Massie Maroon, FNP  metFORMIN (GLUCOPHAGE XR) 500 MG 24 hr tablet Take 1 tablet (500 mg total) by mouth daily with breakfast. Increase to two tablets in 1 week. 05/26/21 05/26/22  Allayne Stack, DO  ondansetron (ZOFRAN-ODT) 4 MG disintegrating tablet Take 1 tablet (4 mg total) by mouth every 8 (eight) hours as needed for nausea or vomiting. 11/06/21   Smoot, Shawn Route, PA-C      Allergies    Patient has no known allergies.    Review of Systems   Review of Systems   HENT:  Positive for ear pain, facial swelling and voice change.   All other systems reviewed and are negative.   Physical Exam Updated Vital Signs BP 135/78   Pulse 91   Temp 98.7 F (37.1 C) (Oral)   Resp (!) 29   Ht 5\' 2"  (1.575 m)   Wt (!) 145.6 kg   SpO2 99%   BMI 58.71 kg/m  Physical Exam Vitals and nursing note reviewed.  Constitutional:      Appearance: She is obese. She is ill-appearing.  HENT:     Head: Normocephalic.     Right Ear: Tympanic membrane normal. No mastoid tenderness.     Mouth/Throat:     Mouth: Mucous membranes are moist.     Dentition: Normal dentition. No dental tenderness.     Tongue: No lesions.     Pharynx: Uvula midline. Posterior oropharyngeal erythema present.     Tonsils: No tonsillar exudate or tonsillar abscesses. 2+ on the right. 2+ on the left.     Comments: Right upper lip swelling  No tongue swelling but change in phonation  No PTA on exam  No stridor or wheezing  Eyes:     General: No scleral icterus.    Extraocular Movements: Extraocular movements intact.  Cardiovascular:     Rate and Rhythm: Normal rate and regular rhythm.  Pulmonary:     Effort: Pulmonary effort is normal.  Breath sounds: Normal breath sounds.  Abdominal:     General: Bowel sounds are normal.     Palpations: Abdomen is soft.  Skin:    General: Skin is warm and dry.     Capillary Refill: Capillary refill takes less than 2 seconds.  Neurological:     General: No focal deficit present.     Mental Status: She is alert.  Psychiatric:        Mood and Affect: Mood normal.        Behavior: Behavior normal.     ED Results / Procedures / Treatments   Labs (all labs ordered are listed, but only abnormal results are displayed) Labs Reviewed  GROUP A STREP BY PCR - Abnormal; Notable for the following components:      Result Value   Group A Strep by PCR DETECTED (*)    All other components within normal limits  COMPREHENSIVE METABOLIC PANEL - Abnormal;  Notable for the following components:   Sodium 132 (*)    Chloride 94 (*)    Glucose, Bld 415 (*)    Albumin 3.3 (*)    All other components within normal limits  LACTIC ACID, PLASMA - Abnormal; Notable for the following components:   Lactic Acid, Venous 2.2 (*)    All other components within normal limits  I-STAT CHEM 8, ED - Abnormal; Notable for the following components:   Sodium 133 (*)    Glucose, Bld 431 (*)    Calcium, Ion 1.10 (*)    All other components within normal limits  CBC WITH DIFFERENTIAL/PLATELET  LACTIC ACID, PLASMA  HCG, SERUM, QUALITATIVE    EKG None  Radiology CT Soft Tissue Neck W Contrast  Result Date: 01/22/2023 CLINICAL DATA:  Initial evaluation for soft tissue infection. EXAM: CT NECK WITH CONTRAST TECHNIQUE: Multidetector CT imaging of the neck was performed using the standard protocol following the bolus administration of intravenous contrast. RADIATION DOSE REDUCTION: This exam was performed according to the departmental dose-optimization program which includes automated exposure control, adjustment of the mA and/or kV according to patient size and/or use of iterative reconstruction technique. CONTRAST:  75mL OMNIPAQUE IOHEXOL 350 MG/ML SOLN COMPARISON:  Prior study from 11/18/2021. FINDINGS: Pharynx and larynx: Oral cavity within normal limits. Slight asymmetry of the palatine tonsils with the left slightly larger than the right. No evidence for acute tonsillitis. No discrete mass. No tonsillar or peritonsillar abscess. Remainder of the oropharynx and nasopharynx within normal limits. No retropharyngeal collection or swelling. Negative epiglottis. Hypopharynx and supraglottic larynx within normal limits. Negative glottis. Subglottic airway clear. Salivary glands: Salivary glands including the parotid and submandibular glands are within normal limits. Thyroid: Grossly normal, although evaluation limited by habitus. Lymph nodes: Mildly prominent right level 2A  node measures at the upper limits of normal at about 1 cm in short axis. No enlarged or pathologic lymph nodes seen within the neck. Vascular: Normal intravascular enhancement seen within the neck. Limited intracranial: Unremarkable. Visualized orbits: Unremarkable. Mastoids and visualized paranasal sinuses: Clear. Skeleton: No discrete or worrisome osseous lesions. Upper chest: No other acute finding. Other: None. IMPRESSION: Negative CT of the neck. No acute inflammatory changes or other abnormality identified. Electronically Signed   By: Rise Mu M.D.   On: 01/22/2023 01:06    Procedures Procedures   Medications Ordered in ED Medications  penicillin g benzathine (BICILLIN LA) 1200000 UNIT/2ML injection 1.2 Million Units (has no administration in time range)  dexamethasone (DECADRON) injection 10 mg (10 mg Intramuscular  Given 01/21/23 2333)  iohexol (OMNIPAQUE) 350 MG/ML injection 75 mL (75 mLs Intravenous Contrast Given 01/22/23 0014)    ED Course/ Medical Decision Making/ A&P    Medical Decision Making Amount and/or Complexity of Data Reviewed Labs: ordered. Radiology: ordered.  Risk Prescription drug management.  Initial Impression and Ddx 47 year old female who presents to the emergency department with face pain, swelling, change in voice Patient PMH that increases complexity of ED encounter:  obesity, diabetes   Interpretation of Diagnostics I independent reviewed and interpreted the labs as followed: No leukocytosis, strep positive, lactic slightly elevated at 2.2.  Glucose elevated  - I independently visualized the following imaging with scope of interpretation limited to determining acute life threatening conditions related to emergency care: CT soft tissue of the neck, which revealed with no acute findings  Patient Reassessment and Ultimate Disposition/Management 47 year old female who presents to the emergency department with right ear and facial pain.  She is  ill-appearing on exam. She has, bilateral tonsillar swelling without evidence of PTA.  There is no evidence of Ludwig's angina on the floor the mouth is soft.  She does have change in phonation.  Oxygenating 100% on room air without wheezing or stridor. Initial high concern for RPA versus other.  Obtaining CT soft tissue of the neck and labs.  Will give Decadron.  On multiple reassessments her airway remained stable.  Labs with positive strep, elevated glucose.  No leukocytosis.  No fever.  CT does not demonstrate any acute findings.  Patient opted for IM penicillin here in the emergency department for treatment.  Presentation is most consistent with acute strep throat. No evidence of PTA, RPA, ludwigs, epiglottitis. No airway compromise. No evidence of AOM with complaint of right ear pain. Likely referred pain from the oropharynx. She is stable after 4 hours here in ED. Given strict return precautions for worsening symptoms. Tolerates PO here in the ED.   Additionally, glucose elevated here today. She has history of diabetes but states she is not taking metformin. I have sent amb ref to internal medicine for her to have follow-up and establishment of care.   The patient has been appropriately medically screened and/or stabilized in the ED. I have low suspicion for any other emergent medical condition which would require further screening, evaluation or treatment in the ED or require inpatient management. At time of discharge the patient is hemodynamically stable and in no acute distress. I have discussed work-up results and diagnosis with patient and answered all questions. Patient is agreeable with discharge plan. We discussed strict return precautions for returning to the emergency department and they verbalized understanding.     Patient management required discussion with the following services or consulting groups:  None  Complexity of Problems Addressed Acute complicated illness or  Injury  Additional Data Reviewed and Analyzed Further history obtained from: Further history from spouse/family member, Past medical history and medications listed in the EMR, and Care Everywhere  Patient Encounter Risk Assessment SDOH impact on management  Final Clinical Impression(s) / ED Diagnoses Final diagnoses:  Strep throat    Rx / DC Orders ED Discharge Orders          Ordered    Ambulatory referral to Internal Medicine        01/22/23 0135              Cristopher Peru, PA-C 01/22/23 0140    Cristopher Peru, PA-C 01/22/23 0142    Gilda Crease, MD 01/22/23  0345  

## 2023-01-21 NOTE — ED Triage Notes (Signed)
Pt arrives to ED c/o right sided facial pain/swelling x 1 hour. Pt reports burning pain to area, denies any injury/ or anything outside or normal routine. Pt denies SOB

## 2023-01-22 ENCOUNTER — Emergency Department (HOSPITAL_COMMUNITY): Payer: No Typology Code available for payment source

## 2023-01-22 ENCOUNTER — Telehealth: Payer: Self-pay

## 2023-01-22 ENCOUNTER — Other Ambulatory Visit (HOSPITAL_COMMUNITY): Payer: No Typology Code available for payment source

## 2023-01-22 LAB — GROUP A STREP BY PCR: Group A Strep by PCR: DETECTED — AB

## 2023-01-22 LAB — LACTIC ACID, PLASMA
Lactic Acid, Venous: 2.2 mmol/L (ref 0.5–1.9)
Lactic Acid, Venous: 1.1 mmol/L (ref 0.5–1.9)

## 2023-01-22 MED ORDER — IOHEXOL 350 MG/ML SOLN
75.0000 mL | Freq: Once | INTRAVENOUS | Status: AC | PRN
  Administered 2023-01-22: 75 mL via INTRAVENOUS

## 2023-01-22 MED ORDER — PENICILLIN G BENZATHINE 1200000 UNIT/2ML IM SUSY
1.2000 10*6.[IU] | PREFILLED_SYRINGE | Freq: Once | INTRAMUSCULAR | Status: AC
  Administered 2023-01-22: 1.2 10*6.[IU] via INTRAMUSCULAR
  Filled 2023-01-22: qty 2

## 2023-01-22 NOTE — ED Notes (Signed)
Swallow screen completed successfully, pt denies any difficulty swallowing.

## 2023-01-22 NOTE — Transitions of Care (Post Inpatient/ED Visit) (Signed)
   01/22/2023  Name: Shelley Bradley MRN: [id number] DOB: July 10, 1976  Today's TOC FU Call Status: Today's TOC FU Call Status:: Unsuccessul Call (1st Attempt) Unsuccessful Call (1st Attempt) Date: 01/22/23  Attempted to reach the patient regarding the most recent Inpatient/ED visit.  Follow Up Plan: Additional outreach attempts will be made to reach the patient to complete the Transitions of Care (Post Inpatient/ED visit) call.   Signature Renelda Loma RMA

## 2023-01-22 NOTE — Discharge Instructions (Addendum)
You were seen in the emergency department today for facial pain and sore throat. You have strep throat. We gave you treatment for this with penicillin here in the emergency department.  Additionally I have referred you to internal medicine/primary care as you have elevated blood sugar here in the emergency department.  If you do not receive a call from them in the next 3 days, please give them a call to schedule an appointment.  I placed their number in your discharge paperwork for community health and wellness.  Please return if you have worsening symptoms or difficulty swallowing.  Usted fue atendido [employer] en el departamento de emergencias por dolor facial y Engineer, mining de Advertising copywriter. Tienes faringitis estreptoccica. Le dimos tratamiento para esto con penicilina aqu en el departamento de emergencias. Adems, lo remit a medicina interna/atencin primaria porque tiene niveles elevados de azcar en la sangre aqu en el departamento de emergencias. Si no recibe una llamada de ellos en los prximos 3 das, llmelos para programar una cita. Puse su nmero en su documentacin de alta para la salud y [address] de la comunidad. Por favor regrese si sus sntomas empeoran o tiene dificultad para tragar.

## 2023-01-25 ENCOUNTER — Telehealth: Payer: Self-pay

## 2023-01-25 NOTE — Transitions of Care (Post Inpatient/ED Visit) (Cosign Needed)
   01/25/2023  Name: Shelley Bradley MRN: [id number] DOB: 07-Sep-1975  Today's TOC FU Call Status: Today's TOC FU Call Status:: Successful TOC FU Call Competed TOC FU Call Complete Date: 01/25/23  Transition Care Management Follow-up Telephone Call Discharge Facility: [facility]) Type of Discharge: Emergency Department Reason for ED Visit: Neurologic How have you been since you were released from the hospital?: Same Any questions or concerns?: No  Items Reviewed: Did you receive and understand the discharge instructions provided?: Yes Medications obtained,verified, and reconciled?: Yes (Medications Reviewed) Any new allergies since your discharge?: No Dietary orders reviewed?: NA Do you have support at home?: Yes People in Home: spouse  Medications Reviewed Today: Medications Reviewed Today     Reviewed by Narda Rutherford, LPN (Licensed Practical Nurse) on 09/11/21 at 1255  Med List Status: <None>   Medication Order Taking? Sig Documenting Provider Last Dose Status Informant  Blood Glucose Monitoring Suppl (TRUE METRIX AIR GLUCOSE METER) DEVI [id number] No 1 each by Does not apply route 4 (four) times daily -  before meals and at bedtime.  Patient not taking: Reported on 05/20/2021   Massie Maroon, FNP Not Taking Active   glucose blood (TRUE METRIX BLOOD GLUCOSE TEST) test strip [id number] No Use as instructed  Patient not taking: Reported on 05/20/2021   Massie Maroon, FNP Not Taking Active   Lancets MISC [id number] No 1 each by Does not apply route 4 (four) times daily -  before meals and at bedtime.  Patient not taking: Reported on 05/20/2021   Massie Maroon, FNP Not Taking Active   metFORMIN (GLUCOPHAGE XR) 500 MG 24 hr tablet [id number]  Take 1 tablet (500 mg total) by mouth daily with breakfast. Increase to two tablets in 1 week. Allayne Stack, DO  Active             Home Care and Equipment/Supplies: Were Home Health Services Ordered?: NA Any  new equipment or medical supplies ordered?: NA  Functional Questionnaire: Do you need assistance with bathing/showering or dressing?: No Do you need assistance with meal preparation?: No Do you need assistance with eating?: No Do you have difficulty maintaining continence: No Do you need assistance with getting out of bed/getting out of a chair/moving?: No Do you have difficulty managing or taking your medications?: No  Follow up appointments reviewed: PCP Follow-up appointment confirmed?: Yes Date of PCP follow-up appointment?: 02/09/23 Follow-up Provider: [facility] Follow-up appointment confirmed?: NA Do you need transportation to your follow-up appointment?: No Do you understand care options if your condition(s) worsen?: Yes-patient verbalized understanding    SIGNATURE. Renelda Loma RMA

## 2023-02-09 ENCOUNTER — Inpatient Hospital Stay: Payer: Self-pay | Admitting: Nurse Practitioner

## 2023-07-15 ENCOUNTER — Ambulatory Visit: Payer: Self-pay | Admitting: Obstetrics and Gynecology

## 2023-07-15 ENCOUNTER — Other Ambulatory Visit: Payer: Self-pay | Admitting: Obstetrics and Gynecology

## 2023-07-15 ENCOUNTER — Other Ambulatory Visit: Payer: Self-pay

## 2023-07-15 DIAGNOSIS — Z1231 Encounter for screening mammogram for malignant neoplasm of breast: Secondary | ICD-10-CM

## 2023-08-17 ENCOUNTER — Ambulatory Visit
Admission: RE | Admit: 2023-08-17 | Discharge: 2023-08-17 | Disposition: A | Discharge status: Home / Self Care | Payer: No Typology Code available for payment source | Source: Ambulatory Visit | Attending: Obstetrics and Gynecology | Admitting: Obstetrics and Gynecology

## 2023-08-17 ENCOUNTER — Ambulatory Visit: Payer: Self-pay | Admitting: *Deleted

## 2023-08-17 VITALS — Wt 342.0 lb

## 2023-08-17 DIAGNOSIS — Z1231 Encounter for screening mammogram for malignant neoplasm of breast: Secondary | ICD-10-CM

## 2023-08-17 DIAGNOSIS — Z1211 Encounter for screening for malignant neoplasm of colon: Secondary | ICD-10-CM

## 2023-08-17 DIAGNOSIS — Z01419 Encounter for gynecological examination (general) (routine) without abnormal findings: Secondary | ICD-10-CM

## 2023-08-17 NOTE — Patient Instructions (Signed)
Explained breast self awareness with Shelley Bradley. Patient did not need a Pap smear today due to last Pap smear was 09/11/2021. Let her know that based on her last result that her next Pap smear will be due in three years. Referred patient to the [facility] for a screening mammogram per recommendation on mobile unit. Appointment scheduled Tuesday, August 17, 2023 at 1020. Patient aware of appointment and will be there. Let patient know the Breast Center will follow up with her within the next couple weeks with results of her mammogram by letter or phone. Shelley Bradley verbalized understanding.  Charissa Knowles, Kathaleen Maser, RN 9:42 AM

## 2023-08-17 NOTE — Progress Notes (Signed)
[name] is a 48 y.o. female who presents to [facility] clinic today with complaint of left outer breast pain since 2017 that comes and goes and left clear spontaneous nipple discharge since 2017. Left breast pain and discharge have been worked up with last diagnostic imaging completed 09/30/2021 and benign with a screening mammogram recommended in one year.   Pap Smear: Pap smear not completed today. Last Pap smear was 09/11/2021 at [facility] clinic and was abnormal - ASCUS with negative HPV . Per patient has no history of an abnormal Pap smear other than her most recent Pap smear. Last Pap smear result is available in Epic.   Physical exam: Breasts Breasts symmetrical. No skin abnormalities bilateral breasts. No nipple retraction bilateral breasts. No nipple discharge bilateral breasts. Unable to express nipple discharge on exam. No lymphadenopathy. No lumps palpated bilateral breasts. Complaints of left outer breast pain on exam consistent with previous exam 09/11/2021.  MS DIGITAL DIAG TOMO BILAT Result Date: 09/30/2021 CLINICAL DATA:  Focal pain in the left breast at 1:30. Clear spontaneous nipple discharge on the left since 2017. EXAM: DIGITAL DIAGNOSTIC BILATERAL MAMMOGRAM WITH TOMOSYNTHESIS AND CAD; ULTRASOUND LEFT BREAST LIMITED TECHNIQUE: Bilateral digital diagnostic mammography and breast tomosynthesis was performed. The images were evaluated with computer-aided detection.; Targeted ultrasound examination of the left breast was performed. COMPARISON:  Previous exam(s). ACR Breast Density Category b: There are scattered areas of fibroglandular density. FINDINGS: No suspicious masses, calcifications, or distortion are identified in the left breast. On physical exam, no suspicious lumps are identified. Targeted ultrasound is performed, showing a prominent vessel in the region of the patient's lump/tenderness at 1:30, 19 cm from the nipple. There is appropriate flow within this vessel. No  abnormalities in the retroareolar region to explain the clear nipple discharge. IMPRESSION: The patient appears to be palpating a prominent vessel at 1:30, 19 cm from the nipple in the left breast. No cause for the patient's left nipple discharge identified. No other suspicious findings. RECOMMENDATION: Recommend breast MRI due to the left breast nipple discharge. Recommend continued annual mammography. I have discussed the findings and recommendations with the patient. If applicable, a reminder letter will be sent to the patient regarding the next appointment. BI-RADS CATEGORY  2: Benign. Electronically Signed   By: Gerome Sam III M.D.   On: 09/30/2021 12:10   Pelvic/Bimanual Pap is not indicated today per BCCCP guidelines.   Smoking History: Patient has never smoked.   Patient Navigation: Patient education provided. Access to services provided for patient through [facility] program. Spanish interpreter Shelley Bradley from [facility] provided.   Colorectal Cancer Screening: Per patient has never had colonoscopy completed. FIT Test given to patient to complete. No complaints today.    Breast and Cervical Cancer Risk Assessment: Patient has family history of her paternal grandmother having breast cancer. Patient has no known genetic mutations or history of radiation treatment to the chest before age 35. Patient does not have history of cervical dysplasia, immunocompromised, or DES exposure in-utero.  Risk Assessment   No risk assessment data for the current encounter  Risk Scores       09/11/2021   Last edited by: Narda Rutherford, LPN   5-year risk: 0.4%   Lifetime risk: 5%            A: BCCCP exam without pap smear Complaint of left outer breast pain and discharge consistent with previous exam.  P: Referred patient to the [facility] for a screening  mammogram per recommendation on mobile unit. Appointment scheduled Tuesday, August 17, 2023 at 1020.  Priscille Heidelberg, RN 08/17/2023 9:42 AM

## 2024-03-01 ENCOUNTER — Ambulatory Visit: Payer: Self-pay

## 2024-03-01 NOTE — Telephone Encounter (Signed)
 Pt states she is having shakiness, tiredness and HA. States that she has been told in the past that she wasa prediabetic. Pt states that she does not have a PCP, wanting to be seen at the [facility] clinic, no providers accepting new pts at this time. Pt scheduled at [facility] clinic. Pt advised to go to UC for current s/s.   Copied from CRM [id number]. Topic: Clinical - Red Word Triage >> Mar 01, 2024  2:52 PM Tobias L wrote: Red Word that prompted transfer to Nurse Triage: shakiness, extremely tired, headache  Interpreter Jamie ID: [phone]

## 2024-03-28 ENCOUNTER — Ambulatory Visit (INDEPENDENT_AMBULATORY_CARE_PROVIDER_SITE_OTHER): Payer: Self-pay | Admitting: Family

## 2024-03-28 ENCOUNTER — Encounter: Payer: Self-pay | Admitting: Family

## 2024-03-28 VITALS — BP 138/94 | HR 87 | Ht 62.0 in | Wt 336.8 lb

## 2024-03-28 DIAGNOSIS — E119 Type 2 diabetes mellitus without complications: Secondary | ICD-10-CM

## 2024-03-28 NOTE — Progress Notes (Signed)
 Shelley Bradley is a 48 y.o. female with the following history as recorded in EpicCare:  Patient Active Problem List   Diagnosis Date Noted   Accelerated hypertension 08/12/2016   Type 2 diabetes mellitus without complication, without long-term current use of insulin  (HCC) 08/12/2016   Language barrier to communication 08/12/2016   Abdominal obesity and metabolic syndrome 08/12/2016   Obesity hypoventilation syndrome (HCC) 05/13/2016   Chronic respiratory failure (HCC) 05/13/2016   OSA (obstructive sleep apnea) 05/06/2016   Cephalalgia    Dyspnea    Ovarian cyst 05/27/2015   Morbid (severe) obesity due to excess calories (HCC) 12/06/2014    No current outpatient medications on file.   No current facility-administered medications for this visit.    Allergies: Patient has no known allergies.  Past Medical History:  Diagnosis Date   Diabetes mellitus without complication (HCC)    Morbid obesity (HCC)    No pertinent past medical history    Obese     Past Surgical History:  Procedure Laterality Date   NO PAST SURGERIES      Family History  Problem Relation Age of Onset   Heart disease Mother    Diabetes Mother    Diabetes Father    Breast cancer Paternal Grandmother 46 - 76   Anesthesia problems Neg Hx    Hypotension Neg Hx    Malignant hyperthermia Neg Hx    Pseudochol deficiency Neg Hx     Social History   Tobacco Use   Smoking status: Former    Types: Cigarettes   Smokeless tobacco: Never  Substance Use Topics   Alcohol use: No    Subjective:   Presents today as a new patient; accompanied by Nurse, learning disability;  Has been having dizziness, palpitations, fingers are numb, blurry vision;  Does have documented history of Type 2 Diabetes but patient says she has only ever been told she was pre-diabetic; her  Hgba1c was at 7.7 7 years ago/ 11 years 6 years ago/ 8.2 years in 2022; but patient notes she has never taken any medication;     Objective:  Vitals:    03/28/24 1338  BP: (!) 138/94  Pulse: 87  SpO2: 95%  Weight: (!) 336 lb 12.8 oz (152.8 kg)  Height: 5' 2 (1.575 m)    General: Well developed, well nourished, in no acute distress  Skin : Warm and dry.  Head: Normocephalic and atraumatic  Lungs: Respirations unlabored; clear to auscultation bilaterally without wheeze, rales, rhonchi  CVS exam: normal rate and regular rhythm.  Neurologic: Alert and oriented; speech intact; face symmetrical; moves all extremities well; CNII-XII intact without focal deficit   Assessment:  1. Type 2 diabetes mellitus without complication, without long-term current use of insulin  (HCC)     Plan:  Will update labs today to establish what range her blood sugar is currently averaging; will start medication based on labs- ? Need for oral vs injectable medications;  Patient is also aware that I will be leaving this practice in about 2 weeks and she will be responsible for finding a new PCP.  Patient is interested in referral to nutritionist- referral updated;   No follow-ups on file.  Orders Placed This Encounter  Procedures   CBC with Differential/Platelet   Comp Met (CMET)   Hemoglobin A1c   Lipid panel   Amb Referral to Nutrition and Diabetic Education    Referral Priority:   Routine    Referral Type:   Consultation    Referral Reason:  Specialty Services Required    Number of Visits Requested:   1    Requested Prescriptions    No prescriptions requested or ordered in this encounter

## 2024-03-28 NOTE — Patient Instructions (Addendum)
 Unfortunately, I will be leaving this clinic in a few weeks. You will need to get established with a new PCP. I think our clinic has limited access for new patients so you will most likely need to call and see if you can get scheduled at a different primary care location.

## 2024-03-29 ENCOUNTER — Other Ambulatory Visit: Payer: Self-pay | Admitting: Family

## 2024-03-29 ENCOUNTER — Ambulatory Visit: Payer: Self-pay | Admitting: Family

## 2024-03-29 DIAGNOSIS — E1169 Type 2 diabetes mellitus with other specified complication: Secondary | ICD-10-CM

## 2024-03-29 LAB — COMPREHENSIVE METABOLIC PANEL WITH GFR
ALT: 26 U/L (ref 0–35)
AST: 27 U/L (ref 0–37)
Albumin: 4 g/dL (ref 3.5–5.2)
Alkaline Phosphatase: 85 U/L (ref 39–117)
BUN: 9 mg/dL (ref 6–23)
CO2: 31 meq/L (ref 19–32)
Calcium: 9.3 mg/dL (ref 8.4–10.5)
Chloride: 94 meq/L — ABNORMAL LOW (ref 96–112)
Creatinine, Ser: 0.53 mg/dL (ref 0.40–1.20)
GFR: 111.38 mL/min (ref >60.00)
Glucose, Bld: 333 mg/dL — ABNORMAL HIGH (ref 70–99)
Potassium: 5.3 meq/L — ABNORMAL HIGH (ref 3.5–5.1)
Sodium: 133 meq/L — ABNORMAL LOW (ref 135–145)
Total Bilirubin: 0.4 mg/dL (ref 0.2–1.2)
Total Protein: 7.4 g/dL (ref 6.0–8.3)

## 2024-03-29 LAB — CBC WITH DIFFERENTIAL/PLATELET
Basophils Absolute: 0 K/uL (ref 0.0–0.1)
Basophils Relative: 0.6 % (ref 0.0–3.0)
Eosinophils Absolute: 0.1 K/uL (ref 0.0–0.7)
Eosinophils Relative: 1.2 % (ref 0.0–5.0)
HCT: 39.2 % (ref 36.0–46.0)
Hemoglobin: 12.9 g/dL (ref 12.0–15.0)
Lymphocytes Relative: 38.2 % (ref 12.0–46.0)
Lymphs Abs: 2.7 K/uL (ref 0.7–4.0)
MCHC: 33 g/dL (ref 30.0–36.0)
MCV: 83.1 fl (ref 78.0–100.0)
Monocytes Absolute: 0.4 K/uL (ref 0.1–1.0)
Monocytes Relative: 5.7 % (ref 3.0–12.0)
Neutro Abs: 3.9 K/uL (ref 1.4–7.7)
Neutrophils Relative %: 54.3 % (ref 43.0–77.0)
Platelets: 291 K/uL (ref 150.0–400.0)
RBC: 4.72 Mil/uL (ref 3.87–5.11)
RDW: 14.8 % (ref 11.5–15.5)
WBC: 7.1 K/uL (ref 4.0–10.5)

## 2024-03-29 LAB — HEMOGLOBIN A1C: Hgb A1c MFr Bld: 12.4 % — ABNORMAL HIGH (ref 4.6–6.5)

## 2024-03-29 LAB — LIPID PANEL
Cholesterol: 133 mg/dL (ref 0–200)
HDL: 33.7 mg/dL — ABNORMAL LOW (ref >39.00)
LDL Cholesterol: 45 mg/dL (ref 0–99)
NonHDL: 99.75
Total CHOL/HDL Ratio: 4
Triglycerides: 275 mg/dL — ABNORMAL HIGH (ref 0.0–149.0)
VLDL: 55 mg/dL — ABNORMAL HIGH (ref 0.0–40.0)

## 2024-03-29 MED ORDER — BLOOD GLUCOSE TEST VI STRP
1.0000 | ORAL_STRIP | Freq: Three times a day (TID) | 0 refills | Status: DC
Start: 2024-03-29 — End: 2024-04-28

## 2024-03-29 MED ORDER — INSULIN GLARGINE 100 UNITS/ML SOLOSTAR PEN: 10.0000 [IU] | PEN_INJECTOR | Freq: Every day | SUBCUTANEOUS | 1 refills | Status: AC

## 2024-03-29 MED ORDER — LANCET DEVICE MISC: 1.0000 | Freq: Three times a day (TID) | 0 refills | Status: AC

## 2024-03-29 MED ORDER — BLOOD GLUCOSE MONITORING SUPPL DEVI: 1.0000 | Freq: Three times a day (TID) | 0 refills | Status: AC

## 2024-03-29 MED ORDER — INSULIN PEN NEEDLE 30G X 8 MM MISC: 1.0000 | 0 refills | Status: AC | PRN

## 2024-03-29 MED ORDER — LANCETS MISC. MISC
1.0000 | Freq: Three times a day (TID) | 0 refills | Status: AC
Start: 2024-03-29 — End: 2024-04-28

## 2024-03-31 ENCOUNTER — Ambulatory Visit: Payer: Self-pay

## 2024-03-31 ENCOUNTER — Other Ambulatory Visit: Payer: Self-pay | Admitting: Family

## 2024-03-31 ENCOUNTER — Telehealth: Payer: Self-pay

## 2024-03-31 DIAGNOSIS — E119 Type 2 diabetes mellitus without complications: Secondary | ICD-10-CM

## 2024-03-31 DIAGNOSIS — Z794 Long term (current) use of insulin: Secondary | ICD-10-CM

## 2024-03-31 NOTE — Progress Notes (Addendum)
 Patient here for diabetic teach. Patient was first instructed on how to use her new glucometer. Her glucose level is 317 at the moment. She was also instructed on how to use her lantus  pen. She was able to inject 10 units today, this was her initial dose.  Patient will continue to increase by 2 units every 2 days until goal of 115-120 is reached.  She does not have follow up appointment at this time. She will try to get care somewhere else due to no insurance. If she is not able to establish at another clinic she will call us  for appointment and TOC with another provider here and continue to self pay.  This visit was conducted in spanish. She did not have any other questions.

## 2024-03-31 NOTE — Progress Notes (Signed)
 Care Guide Pharmacy Note  03/31/2024 Name: Shelley Bradley MRN: [id number] DOB: [birth year]  Referred By: Jason Leita Repine, FNP Reason for referral: Complex Care Management (Outreach to schedule with Pharm d )   Shelley Bradley is a 48 y.o. year old female who is a primary care patient of Jason Leita Repine, FNP.  [name] was referred to the pharmacist for assistance related to: DMII  Successful contact was made with the patient to discuss pharmacy services including being ready for the pharmacist to call at least 5 minutes before the scheduled appointment time and to have medication bottles and any blood pressure readings ready for review. The patient agreed to meet with the pharmacist via telephone visit on (date/time).04/05/2024  Jeoffrey Buffalo , RMA     [place]  [facility], [facility] Guide  Direct Dial: [phone]  Website: [email or web address]

## 2024-04-05 ENCOUNTER — Other Ambulatory Visit: Payer: Self-pay

## 2024-04-05 ENCOUNTER — Telehealth: Payer: Self-pay | Admitting: Pharmacist

## 2024-04-05 NOTE — Telephone Encounter (Signed)
 Attempt was made to contact patient by phone today by Clinical Pharmacist regarding diabetes education.  Unable to reach patient thru interpreter services. Per interpreter she was not able to leave a message / no VM.

## 2024-04-13 ENCOUNTER — Telehealth: Payer: Self-pay

## 2024-04-13 NOTE — Progress Notes (Signed)
 Complex Care Management Care Guide Note  04/13/2024 Name: Shelley Bradley MRN: [id number] DOB: 03-Jul-1976  Shelley Bradley is a 47 y.o. year old female who is a primary care patient of Jason Leita Repine, FNP and is actively engaged with the care management team. I reached out to Shelley Bradley by phone today to assist with re-scheduling  with the Pharmacist.  Follow up plan: Telephone appointment with complex care management team member scheduled for:  05/01/24 at 2:00 p.m.   Dreama [facility]  [facility], [facility] VBCI Assistant Direct Dial: [phone]  Fax: [phone]

## 2024-05-01 ENCOUNTER — Telehealth: Payer: Self-pay | Admitting: Pharmacist

## 2024-05-01 ENCOUNTER — Emergency Department (HOSPITAL_COMMUNITY)
Admission: EM | Admit: 2024-05-01 | Discharge: 2024-05-01 | Disposition: A | Discharge status: Home / Self Care | Payer: Self-pay | Attending: Emergency Medicine | Admitting: Emergency Medicine

## 2024-05-01 ENCOUNTER — Other Ambulatory Visit: Payer: Self-pay

## 2024-05-01 ENCOUNTER — Emergency Department (HOSPITAL_COMMUNITY): Payer: Self-pay

## 2024-05-01 ENCOUNTER — Encounter (HOSPITAL_COMMUNITY): Payer: Self-pay

## 2024-05-01 DIAGNOSIS — R0602 Shortness of breath: Secondary | ICD-10-CM | POA: Insufficient documentation

## 2024-05-01 DIAGNOSIS — Z794 Long term (current) use of insulin: Secondary | ICD-10-CM | POA: Insufficient documentation

## 2024-05-01 DIAGNOSIS — E1165 Type 2 diabetes mellitus with hyperglycemia: Secondary | ICD-10-CM | POA: Insufficient documentation

## 2024-05-01 DIAGNOSIS — R072 Precordial pain: Secondary | ICD-10-CM | POA: Insufficient documentation

## 2024-05-01 DIAGNOSIS — E66813 Obesity, class 3: Secondary | ICD-10-CM | POA: Insufficient documentation

## 2024-05-01 DIAGNOSIS — R739 Hyperglycemia, unspecified: Secondary | ICD-10-CM

## 2024-05-01 LAB — BASIC METABOLIC PANEL WITH GFR
Anion gap: 7 (ref 5–15)
BUN: 9 mg/dL (ref 6–20)
CO2: 29 mmol/L (ref 22–32)
Calcium: 8.7 mg/dL — ABNORMAL LOW (ref 8.9–10.3)
Chloride: 100 mmol/L (ref 98–111)
Creatinine, Ser: 0.55 mg/dL (ref 0.44–1.00)
GFR, Estimated: >60 mL/min (ref >60)
Glucose, Bld: 218 mg/dL — ABNORMAL HIGH (ref 70–99)
Potassium: 4.4 mmol/L (ref 3.5–5.1)
Sodium: 136 mmol/L (ref 135–145)

## 2024-05-01 LAB — CBC
HCT: 38 % (ref 36.0–46.0)
Hemoglobin: 12 g/dL (ref 12.0–15.0)
MCH: 27.5 pg (ref 26.0–34.0)
MCHC: 31.6 g/dL (ref 30.0–36.0)
MCV: 87 fL (ref 80.0–100.0)
Platelets: 266 K/uL (ref 150–400)
RBC: 4.37 MIL/uL (ref 3.87–5.11)
RDW: 13.6 % (ref 11.5–15.5)
WBC: 6.1 K/uL (ref 4.0–10.5)
nRBC: 0 % (ref 0.0–0.2)

## 2024-05-01 LAB — TROPONIN I (HIGH SENSITIVITY)
Troponin I (High Sensitivity): 3 ng/L (ref <18)
Troponin I (High Sensitivity): 3 ng/L (ref <18)

## 2024-05-01 LAB — HCG, SERUM, QUALITATIVE: Preg, Serum: NEGATIVE

## 2024-05-01 MED ORDER — FAMOTIDINE 20 MG PO TABS
20.0000 mg | ORAL_TABLET | Freq: Once | ORAL | Status: AC
  Administered 2024-05-01: 20 mg via ORAL
  Filled 2024-05-01: qty 1

## 2024-05-01 MED ORDER — IOHEXOL 350 MG/ML SOLN
75.0000 mL | Freq: Once | INTRAVENOUS | Status: AC | PRN
  Administered 2024-05-01: 75 mL via INTRAVENOUS

## 2024-05-01 MED ORDER — ALUM & MAG HYDROXIDE-SIMETH 200-200-20 MG/5ML PO SUSP
30.0000 mL | Freq: Once | ORAL | Status: AC
  Administered 2024-05-01: 30 mL via ORAL
  Filled 2024-05-01: qty 30

## 2024-05-01 MED ORDER — ACETAMINOPHEN 500 MG PO TABS
1000.0000 mg | ORAL_TABLET | Freq: Once | ORAL | Status: AC
Start: 2024-05-01 — End: 2024-05-01
  Administered 2024-05-01: 1000 mg via ORAL
  Filled 2024-05-01: qty 2

## 2024-05-01 NOTE — Telephone Encounter (Signed)
 Called interpreter services for initial appointment with patient by Clinical Pharmacist but when I was looking for patient phone number I noticed she in currently in the ED for shortness of breath.  Will have scheduler reach out to try to reschedule appointment.

## 2024-05-01 NOTE — ED Provider Triage Note (Signed)
 Emergency Medicine Provider Triage Evaluation Note  Shelley Bradley , a 48 y.o. female  was evaluated in triage.  Pt complains of chest pain and dyspnea that is worse with deep inspiration.  At baseline is not on oxygen however is requiring oxygen to maintain saturation, placed on nasal cannula by EMS, currently on 2 L by nasal cannula.  Pain is described as substernal, does not radiate.  Review of Systems  Positive: As above Negative:   Physical Exam  BP (!) 145/84   Pulse 80   Temp 98.2 F (36.8 C) (Oral)   Resp 16   Ht 5' 2 (1.575 m)   Wt (!) 145.2 kg   SpO2 98%   BMI 58.53 kg/m  Gen:   Awake, no distress   Resp:  Normal effort  MSK:   Moves extremities without difficulty  Other:  Pulmonary auscultation limited by ability to breathe in fully, no adventitious sounds appreciated with limited auscultation.  Medical Decision Making  Medically screening exam initiated at 1:43 PM.  Appropriate orders placed.  Shelley Bradley was informed that the remainder of the evaluation will be completed by another provider, this initial triage assessment does not replace that evaluation, and the importance of remaining in the ED until their evaluation is complete.  Initial imaging and lab orders placed.   Shelley Bradley, Shelley Bradley 05/01/24 1344

## 2024-05-01 NOTE — Discharge Instructions (Addendum)
 It was our pleasure to provide your ER care today - we hope that you feel better.  If GI/reflux symptoms, try taking pepcid and maalox as need for symptom relief.   For recent chest pain, follow up closely with cardiologist in the next 1-2 weeks - we made referral, and they should be contacting you with an appointment in the next few days.   Your blood sugar is high - drink plenty of water/stay well hydrated, follow heart healthy diabetes meal plan, continue meds, and follow up closely with primary care doctor in the coming week.   Return to ER right away if worse, new symptoms, fevers, recurrent/persistent chest pain, increased trouble breathing, or other concern.      Clinics for the Uninsured  [facility] para personas sin seguro)  [facility] [address] [place], [place] [address] Hours of Operation Monday and Thursday 9AM - 8PM Tuesday and Wednesday 9AM - 5PM Friday, Saturday and Sunday Closed Phone [phone] (English and [place]) Email [email or web address]  Providing primary, dental, and vision care, mental health services, and medication assistance to those who can't afford it.   [employer] [facility] [employer] [address], [place] Monday - Wednesday 8:30 AM - 5 PM Thursday 8:30 AM - 8 PM $5 per visit - Call for an eligibility appointment [phone]  [facility] [address] [place], [address] Monday - Friday 8:30 AM - 5:30 PM Saturdays 9 AM - 1 PM [phone] Serves patients from birth to age 65, providing well visits & sick child care - children who are chronically ill, developmentally delayed or affected by mental health issues.   [facility]) [address] [place], [place] Monday - Friday 9 AM - 6 PM Walk-in Clinic Monday - Thursday 9 AM - 10:30 AM [phone] or [phone] *This clinic sees patients with or without insurance*  [facility]  Primary Care at [facility] [address] [place], [place] [address] [phone]  [facility] [address] Monday - Friday 9 AM - 1 PM 2nd & 4th Saturdays 9 AM - 1 PM Speak with Pam if you are self-pay (no insurance) [phone] or [phone]  Family Medicine at [facility]  Adult [facility] Pediatric Medicine [address] [facility] Monday - Friday 8 AM - 5 PM On site pharmacy Monday - Friday 8 AM - 4:30 PM (closed for lunch 1-1:30 daily) As a [facility], they serve patients with or without insurance and connect families to necessary resources. Ph: [phone] Fax: [phone]  [facility] [address] [place], [facility]  &  [address] [place], [place] [phone]    [phone] [email or web address] Monday, Tuesday, Thursday, Friday 8 AM - 5 PM Saturday 8 AM - 1 PM Wednesday - closed Medicare, Medicaid, most insurance For the uninsured, payment plans start at [employer] Staff speaks Spanish (la personal habla espaol)  [facility] [place], [place] [address] [place] [phone]  [facility] [address] [place], [facility]  &  [address], [address], [place] [phone]    [phone] [place], $120 for self-pay (no insurance)

## 2024-05-01 NOTE — Progress Notes (Signed)
   05/01/24 1748  TOC Brief Assessment  Insurance and Status Lapsed (Clinics for the uninsured added to AVS)  Patient has primary care physician Yes  Home environment has been reviewed From home  Prior level of function: Independent  Prior/Current Home Services No current home services  Social Drivers of Health Review SDOH reviewed no interventions necessary  Readmission risk has been reviewed Yes  Transition of care needs no transition of care needs at this time   Per chart review pt has recently been seen by Dr. Leita Elbe at [facility] at [facility]. Pt has been connected c/diabetic teaching and complex care management.

## 2024-05-01 NOTE — ED Provider Notes (Signed)
 [place] [facility] DE[facility]MENT AT [facility] Provider Note   CSN: [id number] Arrival date & time: 05/01/24  1305     Patient presents with: Chest Pain and Shortness of Breath   Shelley Bradley is a 48 y.o. female.   Pt c/o pain in chest in past day. Constant, dull, non radiating, midline. At times is worth with deep breath. Notes hx same pain previously, pt unsure of cause. Denies hx heart disease. Denies hx cad or chf. Denies hx pericarditis, dvt or pe. No heartburn or indigestion. No new or worsening cough. No sore throat or trouble swallowing. No fever or chills. Pain no different w activity or exertion or whether upright or supine. No back, neck or flank pain. No leg pain or swelling. No chest wall trauma. No fever or chills.   The history is provided by the patient, the EMS personnel and medical records.  Chest Pain Associated symptoms: shortness of breath   Associated symptoms: no abdominal pain, no back pain, no cough, no dysphagia, no fever, no headache, no nausea, no palpitations and no vomiting   Shortness of Breath Associated symptoms: chest pain   Associated symptoms: no abdominal pain, no cough, no fever, no headaches, no neck pain, no rash, no sore throat and no vomiting        Prior to Admission medications   Medication Sig Start Date End Date Taking? Authorizing Provider  ACCU-CHEK GUIDE TEST test strip USE TO CHECK BLOOD GLUCOSE IN THE MORNING, AT NOON AT EVERY NIGHT AT BEDTIME 03/31/24   Jason Leita Repine, FNP  Accu-Chek Softclix Lancets lancets USE 1 TO CHECK BLOOD GLUCOSE IN THE MORNING, AT [facility] AND EVERY NIGHT AT BEDTIME 03/31/24   Jason Leita Repine, FNP  Blood Glucose Monitoring Suppl DEVI 1 each by Does not apply route in the morning, at noon, and at bedtime. May substitute to any manufacturer covered by patient's insurance. 03/29/24   Jason Leita Repine, FNP  insulin  glargine (LANTUS ) 100 unit/mL SOPN Inject 10 Units into the skin  daily. 03/29/24   Jason Leita Repine, FNP  Insulin  Pen Needle (NOVOFINE) 30G X 8 MM MISC Inject 10 each into the skin as needed. 03/29/24   Jason Leita Repine, FNP  Lancets Misc. (ACCU-CHEK SOFTCLIX LANCET DEV) KIT USE TO CHECK BLOOD GLUCOSE 03/31/24   Jason Leita Repine, FNP    Allergies: Patient has no known allergies.    Review of Systems  Constitutional:  Negative for chills and fever.  HENT:  Negative for sore throat and trouble swallowing.   Respiratory:  Positive for shortness of breath. Negative for cough.   Cardiovascular:  Positive for chest pain. Negative for palpitations and leg swelling.  Gastrointestinal:  Negative for abdominal pain, nausea and vomiting.  Genitourinary:  Negative for dysuria and flank pain.  Musculoskeletal:  Negative for back pain and neck pain.  Skin:  Negative for rash.  Neurological:  Negative for syncope and headaches.    Updated Vital Signs BP 119/70 (BP Location: Left Arm)   Pulse 66   Temp 97.6 F (36.4 C) (Oral)   Resp 16   Ht 1.575 m (5' 2)   Wt (!) 145.2 kg   SpO2 100%   BMI 58.53 kg/m   Physical Exam Vitals and nursing note reviewed.  Constitutional:      Appearance: Normal appearance. She is well-developed.  HENT:     Head: Atraumatic.     Nose: Nose normal.     Mouth/Throat:  Mouth: Mucous membranes are moist.     Pharynx: Oropharynx is clear.  Eyes:     General: No scleral icterus.    Conjunctiva/sclera: Conjunctivae normal.  Neck:     Trachea: No tracheal deviation.     Comments: Trachea midline, thyroid not grossly enlarged or tender. No neck mass. No stiffness or rigidity.  Cardiovascular:     Rate and Rhythm: Normal rate and regular rhythm.     Pulses: Normal pulses.     Heart sounds: Normal heart sounds. No murmur heard.    No friction rub. No gallop.  Pulmonary:     Effort: Pulmonary effort is normal. No respiratory distress.     Breath sounds: Normal breath sounds.     Comments: +anterior chest wall  tenderness reproducing symptoms. No sts noted. No skin changes, lesions or cellulitis. No mass.  Abdominal:     General: Bowel sounds are normal. There is no distension.     Palpations: Abdomen is soft. There is no mass.     Tenderness: There is no abdominal tenderness. There is no guarding.     Comments: Obese.   Genitourinary:    Comments: No cva tenderness.  Musculoskeletal:        General: No swelling or tenderness.     Cervical back: Normal range of motion and neck supple. No rigidity. No muscular tenderness.     Right lower leg: No edema.     Left lower leg: No edema.  Lymphadenopathy:     Cervical: No cervical adenopathy.  Skin:    General: Skin is warm and dry.     Findings: No rash.  Neurological:     Mental Status: She is alert.     Comments: Alert, speech normal. Motor/sens grossly intact bil.   Psychiatric:        Mood and Affect: Mood normal.     (all labs ordered are listed, but only abnormal results are displayed) Results for orders placed or performed during the hospital encounter of 05/01/24  Basic metabolic panel   Collection Time: 05/01/24  1:18 PM  Result Value Ref Range   Sodium 136 135 - 145 mmol/L   Potassium 4.4 3.5 - 5.1 mmol/L   Chloride 100 98 - 111 mmol/L   CO2 29 22 - 32 mmol/L   Glucose, Bld 218 (H) 70 - 99 mg/dL   BUN 9 6 - 20 mg/dL   Creatinine, Ser 9.44 0.44 - 1.00 mg/dL   Calcium 8.7 (L) 8.9 - 10.3 mg/dL   GFR, Estimated >39 >39 mL/min   Anion gap 7 5 - 15  CBC   Collection Time: 05/01/24  1:18 PM  Result Value Ref Range   WBC 6.1 4.0 - 10.5 K/uL   RBC 4.37 3.87 - 5.11 MIL/uL   Hemoglobin 12.0 12.0 - 15.0 g/dL   HCT 61.9 63.9 - 53.9 %   MCV 87.0 80.0 - 100.0 fL   MCH 27.5 26.0 - 34.0 pg   MCHC 31.6 30.0 - 36.0 g/dL   RDW 86.3 88.4 - 84.4 %   Platelets 266 150 - 400 K/uL   nRBC 0.0 0.0 - 0.2 %  hCG, serum, qualitative   Collection Time: 05/01/24  1:18 PM  Result Value Ref Range   Preg, Serum NEGATIVE NEGATIVE  Troponin I (High  Sensitivity)   Collection Time: 05/01/24  1:18 PM  Result Value Ref Range   Troponin I (High Sensitivity) 3 <18 ng/L  Troponin I (High Sensitivity)   Collection  Time: 05/01/24  3:23 PM  Result Value Ref Range   Troponin I (High Sensitivity) 3 <18 ng/L   CT Angio Chest PE W/Cm &/Or Wo Cm Result Date: 05/01/2024 CLINICAL DATA:  Pulmonary embolism (PE) suspected, low to intermediate prob, positive D-dimer EXAM: CT ANGIOGRAPHY CHEST WITH CONTRAST TECHNIQUE: Multidetector CT imaging of the chest was performed using the standard protocol during bolus administration of intravenous contrast. Multiplanar CT image reconstructions and MIPs were obtained to evaluate the vascular anatomy. RADIATION DOSE REDUCTION: This exam was performed according to the departmental dose-optimization program which includes automated exposure control, adjustment of the mA and/or kV according to patient size and/or use of iterative reconstruction technique. CONTRAST:  75mL OMNIPAQUE  IOHEXOL  350 MG/ML SOLN COMPARISON:  None Available. FINDINGS: Pulmonary Embolism: No pulmonary embolism. Cardiovascular: No cardiomegaly or pericardial effusion.No aortic aneurysm. Mediastinum/Nodes: No mediastinal mass.No mediastinal, hilar, or axillary lymphadenopathy. Lungs/Pleura: The midline trachea and bronchi are patent. No focal airspace consolidation, pleural effusion, or pneumothorax. Musculoskeletal: No acute fracture or destructive bone lesion. Upper Abdomen: No acute abnormality in the partially visualized upper abdomen. Review of the MIP images confirms the above findings. IMPRESSION: No acute intrathoracic abnormality; specifically, no pulmonary embolism, pneumonia, or pleural effusion. Electronically Signed   By: Rogelia Myers M.D.   On: 05/01/2024 18:54   DG Chest 2 View Result Date: 05/01/2024 CLINICAL DATA:  Chest pain EXAM: CHEST - 2 VIEW COMPARISON:  Chest radiograph January 14, 2017 FINDINGS: Enlarged cardiomediastinal silhouette,  accentuated by low lung volumes. No significant pleural effusion. No new consolidation or pulmonary nodule. Exam is suboptimal due to body habitus. No acute osseous abnormality. IMPRESSION: Enlarged cardiomediastinal silhouette, accentuated by low lung volumes. No active cardiopulmonary disease. Electronically Signed   By: Megan  Zare M.D.   On: 05/01/2024 14:35    EKG: EKG Interpretation Date/Time:  Monday May 01 2024 13:11:54 EDT Ventricular Rate:  81 PR Interval:  158 QRS Duration:  76 QT Interval:  364 QTC Calculation: 422 R Axis:   43  Text Interpretation: Normal sinus rhythm Low voltage QRS Confirmed by Bernard Drivers ([id number]) on 05/01/2024 4:49:24 PM  Radiology: CT Angio Chest PE W/Cm &/Or Wo Cm Result Date: 05/01/2024 CLINICAL DATA:  Pulmonary embolism (PE) suspected, low to intermediate prob, positive D-dimer EXAM: CT ANGIOGRAPHY CHEST WITH CONTRAST TECHNIQUE: Multidetector CT imaging of the chest was performed using the standard protocol during bolus administration of intravenous contrast. Multiplanar CT image reconstructions and MIPs were obtained to evaluate the vascular anatomy. RADIATION DOSE REDUCTION: This exam was performed according to the departmental dose-optimization program which includes automated exposure control, adjustment of the mA and/or kV according to patient size and/or use of iterative reconstruction technique. CONTRAST:  75mL OMNIPAQUE  IOHEXOL  350 MG/ML SOLN COMPARISON:  None Available. FINDINGS: Pulmonary Embolism: No pulmonary embolism. Cardiovascular: No cardiomegaly or pericardial effusion.No aortic aneurysm. Mediastinum/Nodes: No mediastinal mass.No mediastinal, hilar, or axillary lymphadenopathy. Lungs/Pleura: The midline trachea and bronchi are patent. No focal airspace consolidation, pleural effusion, or pneumothorax. Musculoskeletal: No acute fracture or destructive bone lesion. Upper Abdomen: No acute abnormality in the partially visualized upper abdomen.  Review of the MIP images confirms the above findings. IMPRESSION: No acute intrathoracic abnormality; specifically, no pulmonary embolism, pneumonia, or pleural effusion. Electronically Signed   By: Rogelia Myers M.D.   On: 05/01/2024 18:54   DG Chest 2 View Result Date: 05/01/2024 CLINICAL DATA:  Chest pain EXAM: CHEST - 2 VIEW COMPARISON:  Chest radiograph January 14, 2017 FINDINGS: Enlarged cardiomediastinal silhouette, accentuated by  low lung volumes. No significant pleural effusion. No new consolidation or pulmonary nodule. Exam is suboptimal due to body habitus. No acute osseous abnormality. IMPRESSION: Enlarged cardiomediastinal silhouette, accentuated by low lung volumes. No active cardiopulmonary disease. Electronically Signed   By: Megan  Zare M.D.   On: 05/01/2024 14:35     Procedures   Medications Ordered in the ED  acetaminophen  (TYLENOL ) tablet 1,000 mg (1,000 mg Oral Given 05/01/24 1740)  alum & mag hydroxide-simeth (MAALOX/MYLANTA) 200-200-20 MG/5ML suspension 30 mL (30 mLs Oral Given 05/01/24 1740)  famotidine (PEPCID) tablet 20 mg (20 mg Oral Given 05/01/24 1740)  iohexol  (OMNIPAQUE ) 350 MG/ML injection 75 mL (75 mLs Intravenous Contrast Given 05/01/24 1805)                                    Medical Decision Making Problems Addressed: Class 3 obesity (HCC): chronic illness or injury that poses a threat to life or bodily functions Hyperglycemia: acute illness or injury Precordial chest pain: acute illness or injury with systemic symptoms that poses a threat to life or bodily functions  Amount and/or Complexity of Data Reviewed Independent Historian: EMS    Details: hx External Data Reviewed: notes. Labs: ordered. Decision-making details documented in ED Course. Radiology: ordered and independent interpretation performed. Decision-making details documented in ED Course. ECG/medicine tests: ordered and independent interpretation performed. Decision-making details documented  in ED Course.  Risk OTC drugs. Prescription drug management. Decision regarding hospitalization.   Iv ns. Continuous pulse ox and cardiac monitoring. Labs ordered/sent. Imaging ordered.   Differential diagnosis includes chest wall pain, acs, uri, pna, etc. Dispo decision including potential need for admission considered - will get labs and imaging and reassess.   Reviewed nursing notes and prior charts for additional history. External reports reviewed. Additional history from: EMS.   Cardiac monitor: sinus rhythm, rate 68.   Meds for symptom relief.   Labs reviewed/interpreted by me - chem largely unremarkable, glucose mildly high. Hco3 normal. Wbc and hgg normal. Trop x 2 normal and not increasing - after symptoms constant for past day, felt not c/w acs.   Xrays reviewed/interpreted by me - no pna.   CT reviewed/interpreted by me - no PE.   Recheck pt is breathing comfortably, no distress. No current chest pain. Pulse ox 100%. Rr 16. Hr  64.   Pt currently appears stable for ed d/c.   Rec close pcp/card f/u. Referral made.   Return precautions provided.       Final diagnoses:  None    ED Discharge Orders     None          Bernard Drivers, MD 05/01/24 1942

## 2024-05-01 NOTE — ED Triage Notes (Signed)
 Patient arrives via [facility] for chest pain and shob, worse with breathing palpation and movement. Denies lifting or injuries. Not on baseline O2. 324 ASA given en route. CBG 224 with hx of DM. BP 132/80, 100% on [place] 1 liter, HR NSR 80, 12 lead unremarkable. Spanish speaking.

## 2024-05-03 ENCOUNTER — Telehealth: Payer: Self-pay

## 2024-05-03 NOTE — Progress Notes (Signed)
 Complex Care Management Note Care Guide Note  05/03/2024 Name: Marcel Sorter MRN: [id number] DOB: 05/18/1976   Complex Care Management Outreach Attempts: An unsuccessful telephone outreach was attempted today to offer the patient information about available complex care management services.  Follow Up Plan:  Additional outreach attempts will be made to offer the patient complex care management information and services.   Encounter Outcome:  No Answer  Dreama [facility]  [facility]: [facility] Rhonda W, [facility] VBCI Assistant Direct Dial: [phone]  Fax: [phone]

## 2024-05-08 NOTE — Progress Notes (Signed)
 Complex Care Management Care Guide Note  05/08/2024 Name: Tinesha Siegrist MRN: [id number] DOB: [birth year]  [name] is a 48 y.o. year old female who is a primary care patient of Pcp, No and is actively engaged with the care management team. I reached out to [name] by phone today to assist with re-scheduling  with the Pharmacist.  Follow up plan: Telephone appointment with complex care management team member scheduled for:  05/15/24 at 2:00 p.m.   Dreama [facility]  [facility], [facility] VBCI Assistant Direct Dial: [phone]  Fax: [phone]

## 2024-05-10 ENCOUNTER — Encounter: Payer: Self-pay | Attending: Internal Medicine | Admitting: Dietician

## 2024-05-10 ENCOUNTER — Encounter: Payer: Self-pay | Admitting: Dietician

## 2024-05-10 VITALS — Wt 343.3 lb

## 2024-05-10 DIAGNOSIS — E119 Type 2 diabetes mellitus without complications: Secondary | ICD-10-CM | POA: Insufficient documentation

## 2024-05-10 NOTE — Progress Notes (Signed)
 Diabetes Self-Management Education  Visit Type: First/Initial  Appt. Start Time: 1400 Appt. End Time: 1503  05/10/2024  [name], identified by name and date of birth, is a 48 y.o. female with a diagnosis of Diabetes: Type 2.   ASSESSMENT  History includes: type 2 diabetes Labs noted: 03/28/24: 12.4% Medications include: lantus  Supplements: [employer]onic[employer] coffee and 'xuca gold' supplement (reports her mom sells them)   interpretor services assisted in communication for this visit.  Pt present today with her husband.   Pt reports she feels that the people around her have made her fearful about diabetes. Pt states her dad recently passed away and states he had an injury on his foot that did not heal and had to have an amputation and this worries her.   Pt reports she has been checking her blood glucose in the morning and it is typically 100-160mg /dL. Pt states since being prescribed lantus  she has only taken it twice. Pt states both times she took it she felt her heart was racing, got sick feeling, had dry mouth, and a headache. Pt reports last time she took it the next day her blood glucose dropped to 48mg /dL which scared her. Pt states because each time she took her insulin  she felt bad she has stopped taking it. Pt reports she tried to call MD but was told they would call her back and has not received a call. Pt reports she used to be on metformin  but switched care and was prescribed insulin  but would like to take metformin  again. Pt reports she would be more likely to take her medication if it was oral.   Pt states she is taking 2 toniclife supplements (coffee and xuca gold) which her mom sells and states her mom said this would help with her blood glucose. Advised pt that this will not help with blood glucose management.   Pt reports since A1c in September she has made multiple dietary changes including stopped drinking sodas, cut out rice (reports she used to  eat a lot of rice), and reduced tortilla intake.   Weight (!) 343 lb 4.8 oz (155.7 kg). Body mass index is 62.79 kg/m.   Diabetes Self-Management Education - 05/10/24 1349       Visit Information   Visit Type First/Initial      Initial Visit   Diabetes Type Type 2    Date Diagnosed unsure    Are you currently following a meal plan? No    Are you taking your medications as prescribed? Yes      Health Coping   How would you rate your overall health? Good      Psychosocial Assessment   What is the hardest part about your diabetes right now, causing you the most concern, or is the most worrisome to you about your diabetes?   Making healty food and beverage choices    Self-care barriers None    Self-management support Doctor's office    Other persons present Patient    Patient Concerns Nutrition/Meal planning    Special Needs None    Preferred Learning Style No preference indicated    Learning Readiness Ready    What is the last grade level you completed in school? 12      Pre-Education Assessment   Patient understands the diabetes disease and treatment process. Needs Instruction    Patient understands incorporating nutritional management into lifestyle. Needs Instruction    Patient undertands incorporating physical activity into lifestyle. Needs Instruction  Patient understands using medications safely. Needs Instruction    Patient understands monitoring blood glucose, interpreting and using results Needs Instruction    Patient understands prevention, detection, and treatment of acute complications. Needs Instruction    Patient understands prevention, detection, and treatment of chronic complications. Needs Instruction    Patient understands how to develop strategies to address psychosocial issues. Needs Instruction    Patient understands how to develop strategies to promote health/change behavior. Needs Instruction      Complications   Last HgB A1C per patient/outside  source 12.4 %    How often do you check your blood sugar? 1-2 times/day    Fasting Blood glucose range (mg/dL) 29-870;869-820    Postprandial Blood glucose range (mg/dL) >799    Have you had a dilated eye exam in the past 12 months? No    Have you had a dental exam in the past 12 months? No    Are you checking your feet? N/A      Dietary Intake   Breakfast 9/10am: egg, ham, 3 tortillas, beans    Snack (morning) none    Lunch 1pm: meat or chicken with vegetable and potatoes    Snack (afternoon) cookie or bread    Dinner 8/9pm: leftovers from lunch    Snack (evening) none    Beverage(s) water,      Activity / Exercise   Activity / Exercise Type ADL's      Patient Education   Previous Diabetes Education No    Disease Pathophysiology Explored patient's options for treatment of their diabetes;Definition of diabetes, type 1 and 2, and the diagnosis of diabetes;Factors that contribute to the development of diabetes    Healthy Eating Role of diet in the treatment of diabetes and the relationship between the three main macronutrients and blood glucose level;Plate Method;Reviewed blood glucose goals for pre and post meals and how to evaluate the patients' food intake on their blood glucose level.;Meal timing in regards to the patients' current diabetes medication.;Information on hints to eating out and maintain blood glucose control.;Meal options for control of blood glucose level and chronic complications.    Being Active Helped patient identify appropriate exercises in relation to his/her diabetes, diabetes complications and other health issue.;Role of exercise on diabetes management, blood pressure control and cardiac health.    Medications Reviewed patients medication for diabetes, action, purpose, timing of dose and side effects.    Monitoring Taught/evaluated SMBG meter.;Identified appropriate SMBG and/or A1C goals.    Acute complications Taught prevention, symptoms, and  treatment of  hypoglycemia - the 15 rule.;Discussed and identified patients' prevention, symptoms, and treatment of hyperglycemia.    Chronic complications Relationship between chronic complications and blood glucose control;Identified and discussed with patient  current chronic complications    Diabetes Stress and Support Identified and addressed patients feelings and concerns about diabetes    Lifestyle and Health Coping Lifestyle issues that need to be addressed for better diabetes care      Individualized Goals (developed by patient)   Nutrition General guidelines for healthy choices and portions discussed    Physical Activity Exercise 5-7 days per week;30 minutes per day    Medications take my medication as prescribed    Monitoring  Test my blood glucose as discussed    Problem Solving Eating Pattern    Reducing Risk examine blood glucose patterns;do foot checks daily;treat hypoglycemia with 15 grams of carbs if blood glucose less than 70mg /dL    Health Coping Ask for help with  psychological, social, or emotional issues      Post-Education Assessment   Patient understands the diabetes disease and treatment process. Comprehends key points    Patient understands incorporating nutritional management into lifestyle. Comprehends key points    Patient undertands incorporating physical activity into lifestyle. Comprehends key points    Patient understands using medications safely. Comphrehends key points    Patient understands monitoring blood glucose, interpreting and using results Comprehends key points    Patient understands prevention, detection, and treatment of acute complications. Comprehends key points    Patient understands prevention, detection, and treatment of chronic complications. Comprehends key points    Patient understands how to develop strategies to address psychosocial issues. Comprehends key points    Patient understands how to develop strategies to promote health/change behavior.  Comprehends key points      Outcomes   Expected Outcomes Demonstrated interest in learning. Expect positive outcomes    Future DMSE 4-6 wks    Program Status Not Completed          Individualized Plan for Diabetes Self-Management Training:   Learning Objective:  Patient will have a greater understanding of diabetes self-management. Patient education plan is to attend individual and/or group sessions per assessed needs and concerns.   Plan:   Patient Instructions  Metas de Glucosa en Sangre  Ayunas: 80-130?mg/dL  2 horas despus de comer: 80-180?mg/dL  Camina por el vecindario 2-3 veces por semana durante al menos 15 minutos.  Ve al gimnasio 2-3 veces por semana durante 60 minutos.   Blood Glucose Goals  Fasting: 80-130mg /dL  2 hours after eating: 80-180mg /dL  Go walking around the neighborhood 2-3 times a week for at least 15 minutes.   Go to the gym 2-3 times a week for 60 minutes.   Expected Outcomes:  Demonstrated interest in learning. Expect positive outcomes  Education material provided: My Plate, Comprehensive Plate Method Guide (spanish), hypo/hyperglycemia sheet (spanish)  If problems or questions, patient to contact team via:  Phone  Future DSME appointment: 4-6 wks

## 2024-05-10 NOTE — Patient Instructions (Addendum)
 Metas de Glucosa en Sangre  Ayunas: 80-130?mg/dL  2 horas despus de comer: 80-180?mg/dL  Camina por el vecindario 2-3 veces por semana durante al menos 15 minutos.  Ve al gimnasio 2-3 veces por semana durante 60 minutos.   Blood Glucose Goals  Fasting: 80-130mg /dL  2 hours after eating: 80-180mg /dL  Go walking around the neighborhood 2-3 times a week for at least 15 minutes.   Go to the gym 2-3 times a week for 60 minutes.

## 2024-05-11 ENCOUNTER — Telehealth: Payer: Self-pay

## 2024-05-11 DIAGNOSIS — E1169 Type 2 diabetes mellitus with other specified complication: Secondary | ICD-10-CM

## 2024-05-11 NOTE — Telephone Encounter (Signed)
 Shelley Bradley   05/11/2024  3:07 PM  Call ID [phone] and [phone]. Patient called in wanting to know if medication for diabetes could be called in, morphine  pills. Specialist asked patient for clarification and patient repeated, morphine  pills for diabetes. Specialist sent over CRM but did not include medication.  Coaching has been provided to the specialist. Resolving as e2c2 error. Thank you for your submission.

## 2024-05-11 NOTE — Telephone Encounter (Signed)
 Levorn- can you reach out to Pt to see what medications she is needing? (Spanish only)

## 2024-05-11 NOTE — Telephone Encounter (Signed)
 Copied from CRM #[id number]. Topic: Clinical - Medication Question >> May 11, 2024 12:02 PM Thersia BROCKS wrote: Reason for CRM: Patient called in regarding appointment, patient stated wanted to know if she could get an refill on her diabetes medication, patient has also been scheduled for [facility] with a  new provider but wanted to know if she is able to get her medication while she is waiting for that appointment  [id number] [address], [place] - [address] AT [facility] OF [facility] & [address] [place] [address] Phone: [phone] Fax: [phone]

## 2024-05-11 NOTE — Telephone Encounter (Signed)
 Error CRM sent. Need to know name of medication that is needing refill. Awaiting response.

## 2024-05-12 MED ORDER — METFORMIN HCL 1000 MG PO TABS: 1000.0000 mg | ORAL_TABLET | Freq: Every day | ORAL | 1 refills | Status: AC

## 2024-05-15 ENCOUNTER — Other Ambulatory Visit: Payer: Self-pay | Admitting: Pharmacist

## 2024-05-15 DIAGNOSIS — E119 Type 2 diabetes mellitus without complications: Secondary | ICD-10-CM

## 2024-05-15 NOTE — Progress Notes (Signed)
 05/15/2024 Name: Shelley Bradley MRN: [id number] DOB: 16-Nov-1975  Chief Complaint  Patient presents with   Diabetes    Shelley Bradley is a 48 y.o. year old female who presented for a telephone visit.   They were referred to the pharmacist by their PCP for assistance in managing diabetes.    Subjective:  Care Team: Primary Care Provider: Pcp, No ; Next Scheduled Visit: 07/2024 with Harlene Posner, NP  Medication Access/Adherence  Current Pharmacy:  [facility] #[id number] [place], [place] - [phone] AT [facility] OF [facility] & [address] [place] [address] Phone: [phone] Fax: [phone]  [facility] [address], [place] - [address] [place] [address] Phone: [phone] Fax: [phone]  [facility] - [facility] [address], [address] [place] [address] Phone: [phone] Fax: [phone]   Patient reports affordability concerns with their medications: No  Patient reports access/transportation concerns to their pharmacy: No  Patient reports adherence concerns with their medications:  No      Diabetes:  Current medications:  Metformin  1000mg  daily  Lantus  - not taking; she states she tried and it made her nauseated and felt like a zombie.   Medications tried in the past: none  Current glucose readings:  145 this morning;  Other recent readings - 140, 125, 107. At bedtime 132    Patient denies hypoglycemic s/sx including no dizziness, shakiness, sweating. Patient denies hyperglycemic symptoms including no polyuria, polydipsia, polyphagia, nocturia, neuropathy, blurred vision.  Current meal patterns - met with nutritionist 05/10/2024 - Breakfast: wheat toast, avocado, strawberries - Lunch : chicken breast, green beans, rice - Supper: wheat toast, meat - Snacks cookies - occasionally - usually [place]  cookies - Drinks: milk and juice; water  Current physical activity: tries to walk - about 1 hour - just started.   Current medication access support: none  Macrovascular and Microvascular Risk Reduction:  Statin? Not taking statin - LDL at goal;; ACEi/ARB? no; therapy not indicated  Last urinary albumin/creatinine ratio:  Lab Results  Component Value Date   MICRALBCREAT 130.7 (H) 09/15/2017   MICRALBCREAT 129 (H) 08/10/2016   Last eye exam:   Last foot exam: No foot exam found Tobacco Use:  Tobacco Use: Medium Risk (05/10/2024)   Patient History    Smoking Tobacco Use: Former    Smokeless Tobacco Use: Never    Passive Exposure: Not on file     Objective:  Lab Results  Component Value Date   HGBA1C 12.4 (H) 03/28/2024    Lab Results  Component Value Date   CREATININE 0.55 05/01/2024   BUN 9 05/01/2024   NA 136 05/01/2024   K 4.4 05/01/2024   CL 100 05/01/2024   CO2 29 05/01/2024    Lab Results  Component Value Date   CHOL 133 03/28/2024   HDL 33.70 (L) 03/28/2024   LDLCALC 45 03/28/2024   TRIG 275.0 (H) 03/28/2024   CHOLHDL 4 03/28/2024    Medications Reviewed Today     Reviewed by Carla Milling, RPH-CPP (Pharmacist) on 05/15/24 at 1434  Med List Status: <None>   Medication Order Taking? Sig Documenting Provider Last Dose Status Informant  ACCU-CHEK GUIDE TEST test strip [id number] Yes USE TO CHECK BLOOD GLUCOSE IN THE MORNING, AT NOON AT EVERY NIGHT AT BEDTIME Jason Leita Repine, FNP  Active   Accu-Chek Softclix Lancets lancets [id number] Yes USE 1  TO CHECK BLOOD GLUCOSE IN THE MORNING, AT NOON AND EVERY NIGHT AT BEDTIME Jason Leita Repine, FNP  Active   Blood Glucose Monitoring Suppl DEVI [id number] Yes 1 each by Does not apply route in the morning, at noon, and at bedtime. May substitute to any manufacturer covered by patient's insurance. Jason Leita Repine, FNP  Active   insulin  glargine (LANTUS ) 100 unit/mL SOPN [id number]  Inject 10 Units  into the skin daily.  Patient not taking: Reported on 05/15/2024   Jason Leita Repine, FNP  Active   Insulin  Pen Needle (NOVOFINE) 30G X 8 MM MISC [id number]  Inject 10 each into the skin as needed.  Patient not taking: Reported on 05/15/2024   Jason Leita Repine, FNP  Active   Lancets Misc. (ACCU-CHEK SOFTCLIX LANCET DEV) PRESSLEY [id number] Yes USE TO CHECK BLOOD GLUCOSE Jason Leita Repine, FNP  Active   metFORMIN  (GLUCOPHAGE ) 1000 MG tablet [id number] Yes Take 1 tablet (1,000 mg total) by mouth daily. Wheeler Harlene CROME, NP  Active               Assessment/Plan:   Diabetes: - Currently uncontrolled; goal A1c <7%. Cardiorenal risk reduction is opportunities for improvement.. Blood pressure is at goal <130/80. LDL is at goal.  - Reviewed long term cardiovascular and renal outcomes of uncontrolled blood sugar., Reviewed goal A1c, goal fasting, and goal 2 hour post prandial glucose. Recommended to check glucose once or twice a day - try to check later in the day / after meals from time to time, Reviewed dietary modifications including limiting juice - 4 ounces or less per serving., and Reviewed lifestyle modifications including continue to walk - goal is to get at least 150 minutes per week.. - Recommend to continue metformin  1000mg  daily with food.  Follow Up Plan: 4 to 6 weeks  Madelin Ray, PharmD Clinical Pharmacist [facility]

## 2024-06-26 ENCOUNTER — Other Ambulatory Visit: Payer: Self-pay | Admitting: Pharmacist

## 2024-06-26 DIAGNOSIS — E119 Type 2 diabetes mellitus without complications: Secondary | ICD-10-CM

## 2024-06-26 NOTE — Progress Notes (Signed)
 06/26/2024 Name: Iwalani Templeton MRN: [id number] DOB: 1976/05/31  Chief Complaint  Patient presents with   Diabetes    Shelley Bradley is a 48 y.o. year old female who presented for a telephone visit.   They were referred to the pharmacist by their PCP for assistance in managing diabetes.    Subjective:  Care Team: Primary Care Provider: Pcp, No ; Next Scheduled Visit: 07/2024 with Harlene Posner, NP  Medication Access/Adherence  Current Pharmacy:  [facility] #[id number] [address], [place] - [phone] AT [facility] OF [employer] & [address] [place] [address] Phone: [phone] Fax: [phone]  [facility] [address], [place] - [address] [place] [address] Phone: [phone] Fax: [phone]  [facility] - [facility] [address] [place] [address] Phone: [phone] Fax: [phone]   Patient reports affordability concerns with their medications: No  Patient reports access/transportation concerns to their pharmacy: No  Patient reports adherence concerns with their medications:  No      Diabetes:  Current medications:  Metformin  1000mg  daily  Lantus  - not taking; she states she tried and it made her nauseated and felt like a zombie.   Medications tried in the past: none  Current glucose readings:  125 this morning;  Other recent readings reported by patient starting 05/20/2024 thru 11/30: 217, 200, 202, 180, 181, 180, 201, 200, 190, 193, 175, 175, 174, 173, 163, 152, 143, 123, 123, 125, 100, 108, 110, 100, 124   Patient denies hypoglycemic s/sx including no dizziness, shakiness, sweating. Patient denies hyperglycemic symptoms including no polyuria, polydipsia, polyphagia, nocturia, neuropathy, blurred vision.  Current meal patterns - met with nutritionist 05/10/2024. Today patient reports she continues to  follow diet similar to last visit.  - Breakfast: wheat toast, avocado, strawberries - Lunch : chicken breast, green beans, rice - Supper: wheat toast, meat - Snacks cookies - occasionally - usually [place] cookies - Drinks: milk and juice; water  Current physical activity: walking a little when weather is nice but has not walked in the last 2 weeks. She has done some exercise inside.   Current medication access support: none  Macrovascular and Microvascular Risk Reduction:  Statin? Not taking statin - LDL at goal;; ACEi/ARB? no; therapy not indicated  Last urinary albumin/creatinine ratio:  Lab Results  Component Value Date   MICRALBCREAT 130.7 (H) 09/15/2017   MICRALBCREAT 129 (H) 08/10/2016   Last eye exam:   Last foot exam: No foot exam found Tobacco Use:  Tobacco Use: Medium Risk (05/10/2024)   Patient History    Smoking Tobacco Use: Former    Smokeless Tobacco Use: Never    Passive Exposure: Not on file     Objective:  Lab Results  Component Value Date   HGBA1C 12.4 (H) 03/28/2024    Lab Results  Component Value Date   CREATININE 0.55 05/01/2024   BUN 9 05/01/2024   NA 136 05/01/2024   K 4.4 05/01/2024   CL 100 05/01/2024   CO2 29 05/01/2024    Lab Results  Component Value Date   CHOL 133 03/28/2024   HDL 33.70 (L) 03/28/2024   LDLCALC 45 03/28/2024   TRIG 275.0 (H) 03/28/2024   CHOLHDL 4 03/28/2024    Medications Reviewed Today     Reviewed by Carla Milling, RPH-CPP (Pharmacist) on 06/26/24 at 1419  Med List Status: <None>  Medication Order Taking? Sig Documenting Provider Last Dose Status Informant  ACCU-CHEK GUIDE TEST test strip [id number] Yes USE TO CHECK BLOOD GLUCOSE IN THE MORNING, AT NOON AT EVERY NIGHT AT BEDTIME Jason Leita Repine, FNP  Active   Accu-Chek Softclix Lancets lancets [id number] Yes USE 1 TO CHECK BLOOD GLUCOSE IN THE MORNING, AT NOON AND EVERY NIGHT AT BEDTIME Jason Leita Repine, FNP  Active   Blood Glucose  Monitoring Suppl DEVI [id number] Yes 1 each by Does not apply route in the morning, at noon, and at bedtime. May substitute to any manufacturer covered by patient's insurance. Jason Leita Repine, FNP  Active   insulin  glargine (LANTUS ) 100 unit/mL SOPN [id number]  Inject 10 Units into the skin daily.  Patient not taking: Reported on 06/26/2024   Jason Leita Repine, FNP  Active   Insulin  Pen Needle (NOVOFINE) 30G X 8 MM MISC [id number]  Inject 10 each into the skin as needed.  Patient not taking: Reported on 06/26/2024   Jason Leita Repine, FNP  Active   Lancets Misc. (ACCU-CHEK SOFTCLIX LANCET DEV) PRESSLEY [id number] Yes USE TO CHECK BLOOD GLUCOSE Jason Leita Repine, FNP  Active   metFORMIN  (GLUCOPHAGE ) 1000 MG tablet [id number] Yes Take 1 tablet (1,000 mg total) by mouth daily. Wheeler Harlene CROME, NP  Active               Assessment/Plan:   Diabetes: - Currently uncontrolled; goal A1c <7%. Cardiorenal risk reduction is opportunities for improvement.. Blood pressure is at goal <130/80. LDL is at goal.  - Reviewed long term cardiovascular and renal outcomes of uncontrolled blood sugar., Reviewed goal A1c, goal fasting, and goal 2 hour post prandial glucose. Recommended to check glucose once a day - at varying times a day, Reviewed dietary modifications including limiting juice - 4 ounces or less per serving., and Reviewed lifestyle modifications including encouraged patietn to walk daily - can try to go to large store to walk if weather is too cold to be outside. - Recommend to continue metformin  1000mg  daily with food.  Follow Up Plan: 4 to 6 weeks - establish care with new PCP.  I will follow up in 2 months.   Madelin Ray, PharmD Clinical Pharmacist [facility]

## 2024-06-27 ENCOUNTER — Encounter: Payer: Self-pay | Admitting: Dietician

## 2024-08-11 ENCOUNTER — Ambulatory Visit: Payer: Self-pay | Admitting: Student

## 2024-08-11 NOTE — Progress Notes (Signed)
 No call, NO show

## 2024-09-06 ENCOUNTER — Other Ambulatory Visit: Payer: Self-pay
